# Patient Record
Sex: Female | Born: 1935 | Race: Black or African American | Hispanic: No | Smoking: Never smoker
Health system: Southern US, Community
[De-identification: ages and names within clinical notes are randomized; demographics above are authoritative.]

## PROBLEM LIST (undated history)

## (undated) DIAGNOSIS — I10 Essential (primary) hypertension: Secondary | ICD-10-CM

## (undated) DIAGNOSIS — E119 Type 2 diabetes mellitus without complications: Secondary | ICD-10-CM

## (undated) DIAGNOSIS — N2889 Other specified disorders of kidney and ureter: Secondary | ICD-10-CM

## (undated) DIAGNOSIS — N181 Chronic kidney disease, stage 1: Secondary | ICD-10-CM

---

## 2004-06-07 ENCOUNTER — Emergency Department (HOSPITAL_COMMUNITY): Admission: EM | Admit: 2004-06-07 | Discharge: 2004-06-08 | Payer: Self-pay | Admitting: Emergency Medicine

## 2006-01-02 ENCOUNTER — Other Ambulatory Visit: Admission: RE | Admit: 2006-01-02 | Discharge: 2006-01-02 | Payer: Self-pay | Admitting: Family Medicine

## 2006-06-22 ENCOUNTER — Ambulatory Visit (HOSPITAL_COMMUNITY): Admission: RE | Admit: 2006-06-22 | Discharge: 2006-06-22 | Payer: Self-pay | Admitting: Gastroenterology

## 2007-01-27 ENCOUNTER — Emergency Department (HOSPITAL_COMMUNITY): Admission: EM | Admit: 2007-01-27 | Discharge: 2007-01-27 | Payer: Self-pay | Admitting: Emergency Medicine

## 2007-02-25 ENCOUNTER — Ambulatory Visit (HOSPITAL_COMMUNITY): Admission: RE | Admit: 2007-02-25 | Discharge: 2007-02-26 | Payer: Self-pay | Admitting: Neurological Surgery

## 2007-06-28 ENCOUNTER — Emergency Department (HOSPITAL_COMMUNITY): Admission: EM | Admit: 2007-06-28 | Discharge: 2007-06-29 | Payer: Self-pay | Admitting: Emergency Medicine

## 2007-07-03 ENCOUNTER — Emergency Department (HOSPITAL_COMMUNITY): Admission: EM | Admit: 2007-07-03 | Discharge: 2007-07-03 | Payer: Self-pay | Admitting: Emergency Medicine

## 2007-08-29 ENCOUNTER — Inpatient Hospital Stay (HOSPITAL_COMMUNITY): Admission: EM | Admit: 2007-08-29 | Discharge: 2007-09-03 | Payer: Self-pay | Admitting: Emergency Medicine

## 2007-08-30 ENCOUNTER — Encounter (INDEPENDENT_AMBULATORY_CARE_PROVIDER_SITE_OTHER): Payer: Self-pay | Admitting: Internal Medicine

## 2009-12-10 IMAGING — CR DG HIP COMPLETE 2+V*R*
3 series · 7 of 7 positions shown · non-contrast
Comparison: NONE

CLINICAL DATA: Right leg pain. 

RIGHT HIP

[Series 1: view not recorded · 0.17mm/px · 2 of 2 slices shown (1 of 3)]
[im 1/2]
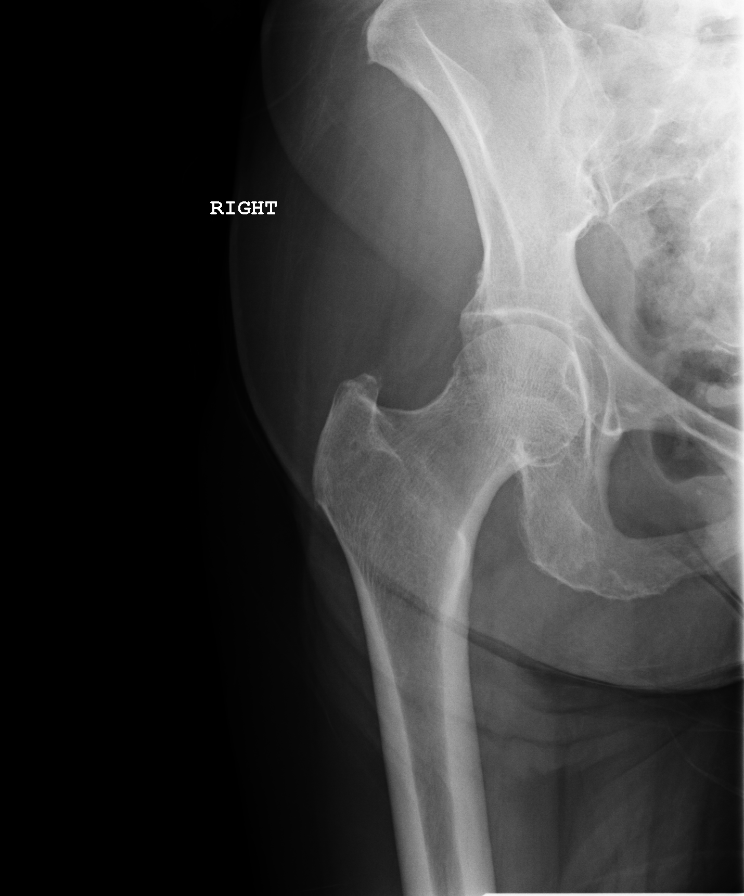
[im 2/2]
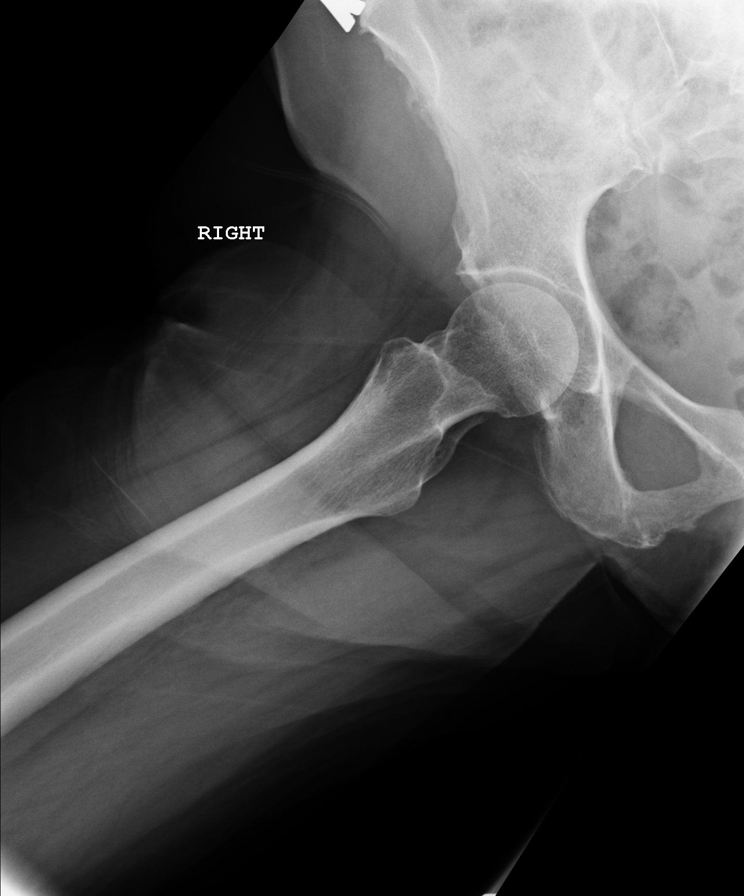

[Series 2: view not recorded · 0.17mm/px · 4 of 4 slices shown (2 of 3)]
[im 1/4]
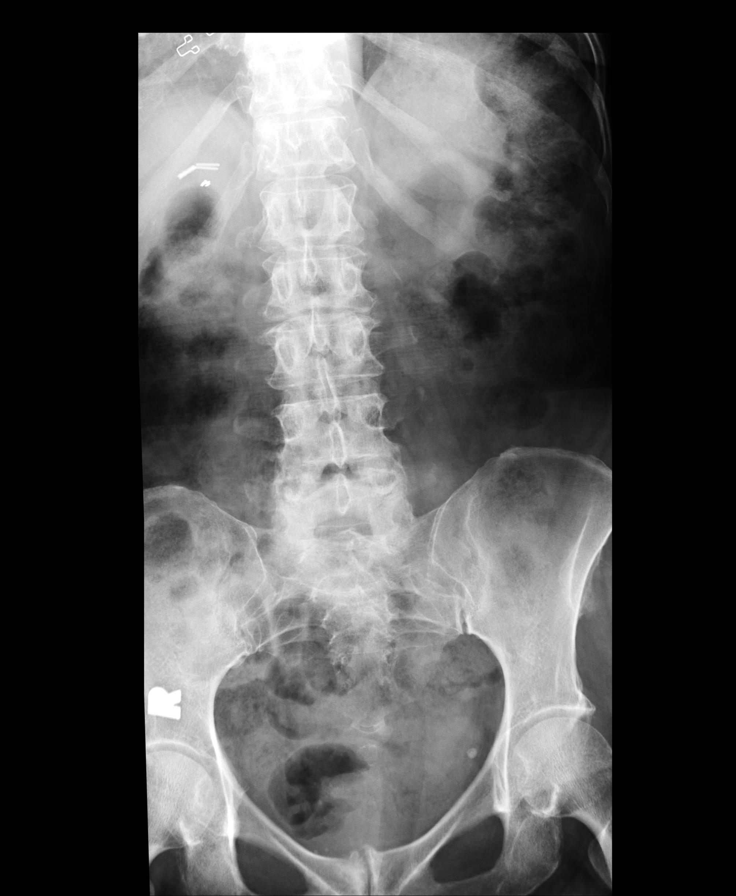
[im 2/4]
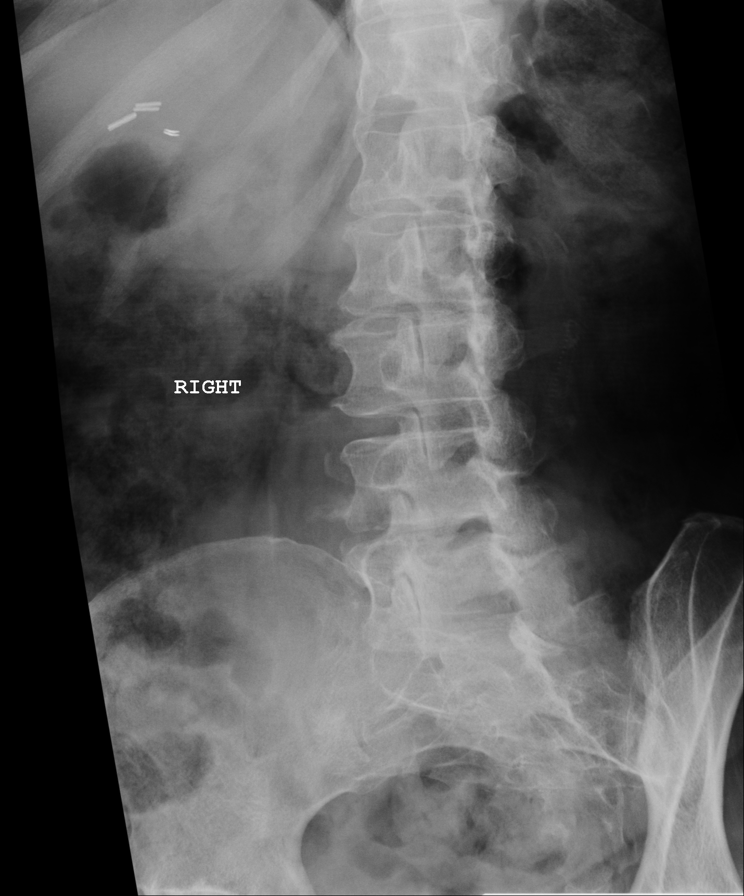
[im 3/4]
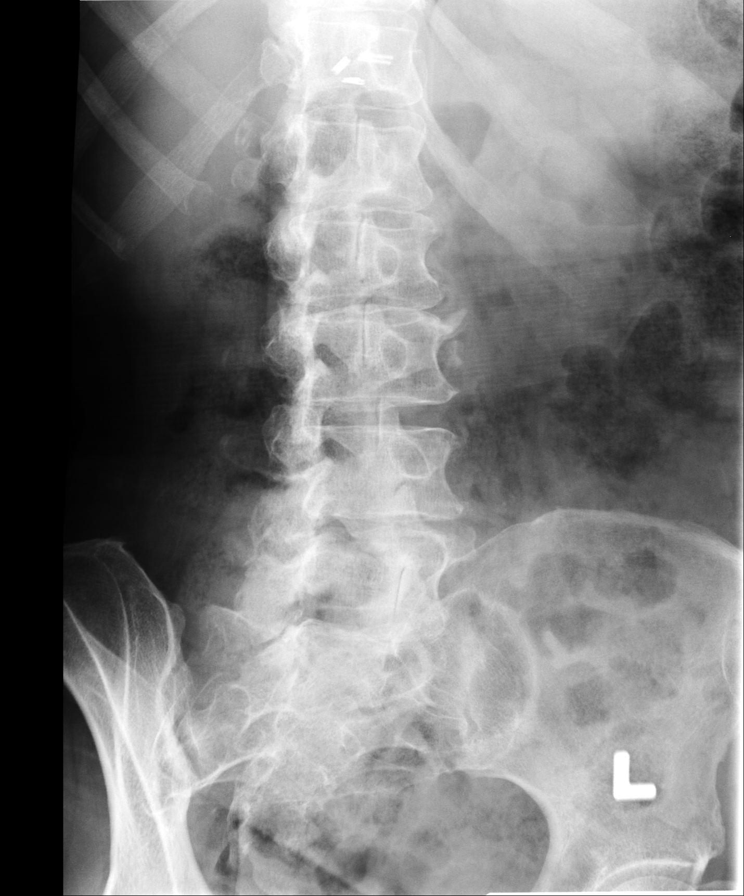
[im 4/4]
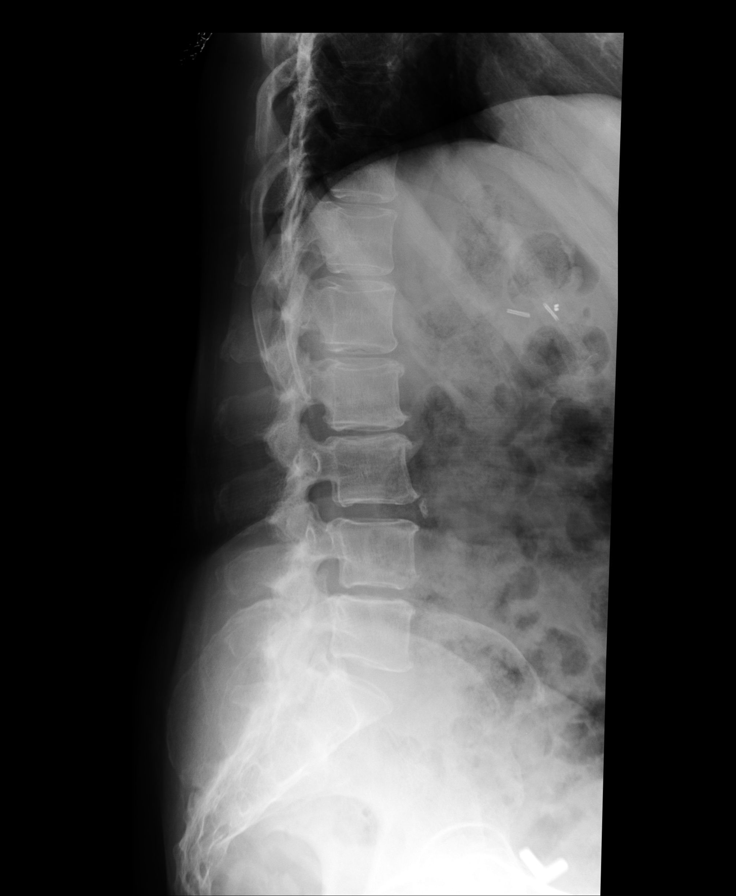

[view not recorded (3 of 3)]
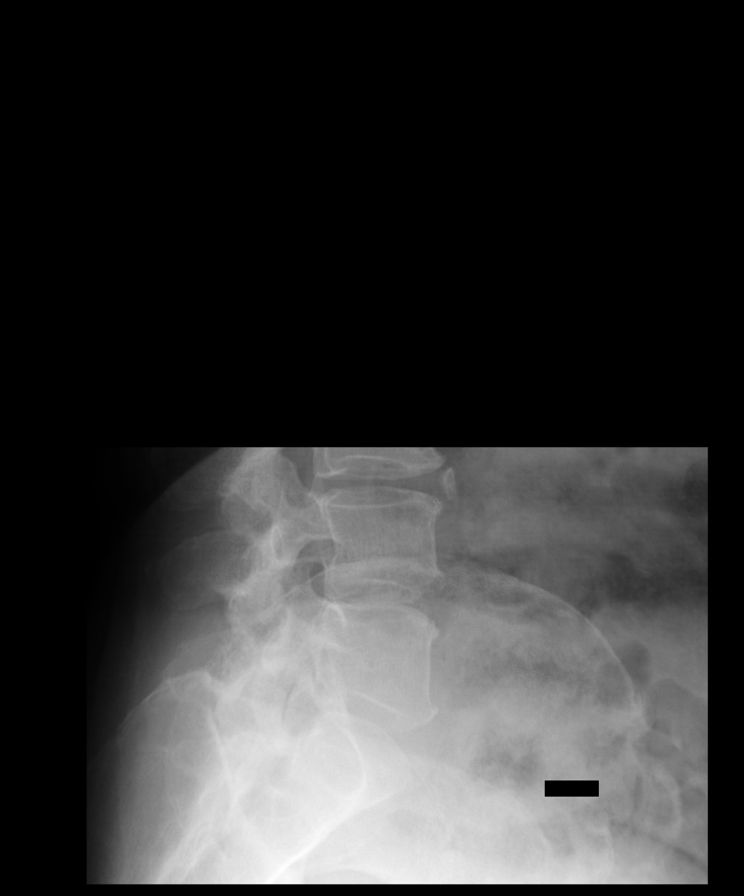

[7 of 7 positions shown; findings below may reference images not displayed]

FINDINGS: Views of the right hip demonstrate no evidence of 
fracture, dislocation, soft tissue abnormality or changes 
suggesting erosive or degenerative arthritis. 

electronically reviewed on 01/15/2007 Dict Date: 01/14/2007  Tran 
Date:  01/15/2007 DAS  [REDACTED]

## 2010-05-21 NOTE — Op Note (Signed)
NAME:  Adrienne Gonzalez, Adrienne Gonzalez NO.:  [id number]   MEDICAL RECORD NO.:  [id number]          PATIENT TYPE:  INP   LOCATION:  3028                         FACILITY:  [facility]   PHYSICIAN:  Tia Alert, MD     DATE OF BIRTH:  [birth year]   DATE OF PROCEDURE:  02/25/2007  DATE OF DISCHARGE:                               OPERATIVE REPORT   PREOPERATIVE DIAGNOSIS:  Lumbar spinal stenosis, L4-5, with right leg  pain.   POSTOPERATIVE DIAGNOSIS:  Lumbar spinal stenosis, L4-5, with right leg  pain.   PROCEDURE:  Decompressive lumbar hemilaminectomy, medial facetectomy and  foraminotomy L4-5 on the right for decompression of the right L5 nerve  root and lateral recess.   SURGEON:  Marikay Alar, M.D.   ASSISTANT:  Dr. Aliene Beams.   ANESTHESIA:  General tracheal.   COMPLICATIONS:  None apparent.   INDICATIONS FOR PROCEDURE:  Ms. Proano is a 77 year old female who is  referred with severe right leg pain.  She had MRI which showed severe  spinal stenosis at L4-5 with severe degenerative disease.  I recommended  a lumbar hemilaminectomy, medial facetectomy, with foraminotomy at L4-5  on the right side to decompress the right L5 nerve root.  She understood  the risks, benefits, and expected outcome and wished to proceed.   DESCRIPTION OF PROCEDURE:  The patient was taken to operating room and  after induction of adequate generalized endotracheal anesthesia, she was  rolled into the prone position on the Wilson frame.  All pressure points  were padded.  Her lumbar region was prepped with DuraPrep and draped in  usual sterile fashion.  5 mL local anesthesia was injected and a small  dorsal midline incision was made and carried down to the lumbosacral  fascia.  The fascia was opened.  The paraspinous musculature was taken  down in subperiosteal fashion to expose L4-5 on the right.  Intraoperative x-ray confirmed my level and then I used the high-speed  drill and the Kerrison punches  to perform a hemilaminectomy, medial  facetectomy and foraminotomy at L4-5 on the right side.  The underlying  yellow ligament was very thickened.  It was opened and removed in a  piecemeal fashion to identify the underlying dura and L5 nerve root.  I  dissected out to the medial pedicle wall and decompressed the lateral  recess by undercutting the facet.  I then palpated with a coronary  dilator into the foramen along the nerve root into the midline to assure  adequate decompression.  I felt like I had a good decompression of the  L5 nerve root.  I irrigated with saline solution containing bacitracin,  dried all bleeding points and then lined the dura with Gelfoam and  closed the fascia with 0-0 Vicryl, closed the subcutaneous and  subcuticular tissue with 2-0 and 3-0 Vicryl and closed the skin  with Benzoin and Steri-Strips.  The drapes removed.  Sterile dressing  was applied.  The patient was awakened from general anesthesia and  transferred to recovery room in stable condition.  At the end of  procedure all sponge, needle and sponge counts were correct.      Tia Alert, MD  Electronically Signed     DSJ/MEDQ  D:  02/25/2007  T:  02/26/2007  Job:  [id number]

## 2010-05-21 NOTE — Discharge Summary (Signed)
Adrienne Gonzalez, GAERTNER NO.:  [id number]   MEDICAL RECORD NO.:  [id number]          PATIENT TYPE:  INP   LOCATION:  1433                         FACILITY:  [facility]   PHYSICIAN:  Hollice Espy, M.D.DATE OF BIRTH:  [birth year]   DATE OF ADMISSION:  08/29/2007  DATE OF DISCHARGE:                               DISCHARGE SUMMARY   ANTICIPATED DATE OF DISCHARGE:  September 03, 2007.   PCP:  Dr. Laurann Montana.   GASTROENTEROLOGIST:  Dr. Randa Evens.   DISCHARGE DIAGNOSES:  1. Protein calorie malnutrition.  2. Vitamin deficiency including hypokalemia.  3. Hypocalcemia secondary vitamin deficiency.  4. Acute renal failure secondary to diarrhea now back to baseline.  5. Chronic renal insufficiency with baseline creatinine of 1.9.  6. Ileus now resolved.  7. Iron-deficiency anemia, status post transfusion.   HOSPITAL COURSE:  The patient is an 77 year old African American female  with past medical history of hypertension who, for the last several  months, had been having problems with chronic diarrhea.  She was  evaluated by gastroenterology and her PCP and eventually her diarrhea  had subsided about 10 days to 2 weeks before coming to the hospital.  However, since her diarrhea had resolved, the patient's daughter had  noted the patient had been eating less and less a got to the point where  she was eating so little, that the daughter brought the patient when the  patient had suffered a fall off the bed.  After initial evaluation the  patient was admitted for failure to thrive with dehydration on admission  the patient was found to have a creatinine of 3.57 with her baseline,  reportedly being around 1.5.  Rest of her labs were unremarkable except  some mildly elevated transaminases.  The patient was brought into the  hospital.  Regards to her nutrition status, initially nutrition consult  was put in.  The patient had attempts to eat.  She was found to have  severe vitamin  deficiencies including a low potassium as low as 1.7,  calcium 4.8 and attempts for the patient to eat led to nausea and  vomiting.  That the patient had abdominal x-ray which showed a KUB  showing an ileus and significant constipation.  The patient was given  medication for this.  She moved her bowels and by followup film done on  August 26 showed resolution of ileus and resolution of constipation.  In  the meantime, the patient was aggressively hydrated and by August 27,  her BUN had come down to 10, her creatinine was down to 1.9, much closer  to her baseline.  Over time, nutrition had been following her.  The  patient was given additional supplementation to replace her hypokalemia  and hypocalcemia.  The plan will be for her to go home on multivitamin  and Os-Cal 500 b.i.d.  Nutrition met with the patient and recommended by  time of discharge the patient go home on a carb-modified diet with  Ensure with fiber t.i.d. with meals.  Nutrition services will continue  to follow her via home health.  The patient was evaluated by PT/OT here  in the hospital who recommend that she go home with skilled nurse as  well as PT/OT and a home health aid, as well as equipment included  hospital bed, three-in-one commode and a rolling walker.  These things  are being set up through [facility].  By August 27 the patient  was starting to feel improved.  Her renal function is much closer to her  baseline.  She will continue to receive IV fluids until August 28, date  of discharge.  Her overall disposition from initial presentation is  improved.  Her activity will be as per home health PT/OT.  Discharge  diet is as above.  She will follow up with PCP, Dr. Cliffton Asters, in 1 week and  Dr. Randa Evens, her GI doctor in 2 weeks.   In addition, her discharge medications are as follows.  New medications:  1. Multivitamin p.o. daily.  2. Os-Cal 500 mg p.o. b.i.d.   She will continue previous medications:  1.  Aspirin 81 mg p.o. daily.  2. Paroxetine 10 p.o. daily.  3. Benicar 40/25 one-half tablet p.o. daily.  4. Cosopt eyedrops both eyes daily.  5. Travatan eye drops both eyes daily.  6. Elavil 10 mg p.o. nightly.      Hollice Espy, M.D.  Electronically Signed     SKK/MEDQ  D:  09/02/2007  T:  09/02/2007  Job:  [id number]

## 2010-05-21 NOTE — H&P (Signed)
NAME:  Adrienne Gonzalez, Adrienne Gonzalez NO.:  [id number]   MEDICAL RECORD NO.:  [id number]          PATIENT TYPE:  EMS   LOCATION:  ED                           [facility]AC[facility]   PHYSICIAN:  Michiel Cowboy, MDDATE OF BIRTH:  20-May-1925   DATE OF ADMISSION:  08/29/2007  DATE OF DISCHARGE:                              HISTORY & PHYSICAL   PRIMARY CARE Rahmon Heigl:  Dr. Cliffton Asters   GASTROENTEROLOGIST:  Dr. Randa Evens.   HISTORY OF PRESENT ILLNESS:  The patient is an 77 year old female with  history of chronic nausea and vomiting with decreased p.o. intake for  which she has been having a full workup done by Dr. Randa Evens.  So far,  per family, workup was unremarkable.  The patient also has history of  diabetes and hypertension.  She comes in today brought in by her family  because she noted to have suffered a fall off the bed earlier this  night.  Also, per family, she has not been eating well for some time  now.  Per family for past couple of days, they could only get a couple  ounces of food and drink in her.  She is basically either refusing the  food or waiting to eat.  The patient is constantly saying to the family  she needs to go to the bathroom, but when goes to the bathroom, does not  produce anything.  Family has not seen any nausea or vomiting.  Patient  is not providing her own history, but does answer yes and no to the  questions.  The patient states she has not had any chest pain or  shortness of breath.  Not noticed any fevers.  Also, per family, the  patient has been walking strangely dragging her left or thinking that it  has probably been going on for about a week or so.  Also, had been  unsteady on her feet, possibly for the past couple weeks.  Otherwise,  also noted some change in her weight.  She seemed to be more skinny than  before.  No urinary complaints.  No skin changes.  The patient was  reportedly sleeping, and then was found next to bed after a big thump.  Unclear if she was trying to get up from bed by herself to use the  restroom and fell versus she rolled out of bed and fell.  She did not  lose consciousness but did hit her head.  CT scan of the head in the  emergency department was unremarkable.  CT scan of the neck showed old  osteoarthritis but no new fractures.  [facility] hospitalists called to admit  the patient. Of note, the patient's family has been stating that her  sugars have been kind of running low in the 70s to 90s.  That is the  reason why they discontinued her glimepiride.   REVIEW OF SYSTEMS:  As per HPI, otherwise unremarkable.  All 12 systems  reviewed.   PAST MEDICAL HISTORY:  1. Recurrent nausea/vomiting.  2. Failure to thrive.  3. History of hypertension.  4. Diabetes.  ALLERGIES:  The patient is allergic to morphine, codeine and Vicodin.   SOCIAL HISTORY:  The patient does not smoke or drink or use drugs.  Lives at home with her family.   FAMILY HISTORY:  Noncontributory.   MEDICATIONS:  1. Aspirin 81 mg today.  2. Paroxetine 10 mg p.o. daily.  3. Benicar 40/25 once a day.  The patient been taking half a tablet.  4. Cosopt eye drops one drop to both eyes.  5. Travatan one drop q.h.s. to left eye.  6. Amitriptyline 10 mg p.o. bedtime.  7. The patient stopped taking her dicyclomine and      diphenoxylate/atropine and glimepiride.   PHYSICAL EXAMINATION:  VITALS:  Temperature 97.3, blood pressure 106/68,  pulse 67, respirations 27, 100% on room air.  GENERAL:  The patient appears in no acute distress, lying down on the  stretcher.  Thin female, head is small, swelling on the forehead.  Otherwise, unremarkable.  Dry mucous membranes.  Decreased skin turgor.  LUNGS:  Occasional crackles at the bases bilaterally, worse on the left.  ABDOMEN:  Soft, nontender, nondistended.  HEART:  Regular rate and rhythm.  No murmurs, rubs or gallops.  LOWER EXTREMITIES: Without edema.  NEUROLOGIC:  There is 5/5 strength in  upper extremities with possible  left leg weakness.  The patient unable to lift it off the bed.  Also,  possible foot drop, the patient is not well cooperative with exam.  Does  not follow directions.  Cranial nerves II-XII intact.   LABORATORY DATA:  White blood cell count 5.9, hemoglobin 8.9, platelets  227.  Sodium 142, potassium 2.5, creatinine 3.57, baseline 1.2.  Total  bilirubin 1.1, calcium 4.1, alk phos 89, AST 106, ALT 52, alkaline 1.8.   RADIOLOGY:  CT scan of the head showing no acute abnormalities.  CT scan  of the neck showed no fracture, but history of osteoarthritis.  EKG is  very poor quality, but showing sinus rhythm.  Will repeat.   ASSESSMENT/PLAN:  This is an 77 year old female with a history of  repeated episodes of dehydration secondary to poor nutrition on  Phenergan and dehydration.  1. Failure to thrive with dehydration.  Will obtain nutrition consult,      give IV fluids.  Again, the patient had not been eating well.  Will      give thiamine.  If nauseous, will treat with Zofran.  If nausea      persists, would consider official GI consult.  2. Acute renal failure, likely secondary to dehydration, but will      obtain a urine sodium and urine creatinine, renal ultrasound.  3. Elevated LFTs.  Will obtain right upper quadrant ultrasound to      evaluate if this has anything to do with her nausea potentially.  4. Hypokalemia.  Replace, watch on telemetry.  5. Hypocalcemia.  Replace.  6. Constant urge to go to the restroom to defecate.  Will obtain KUB      to evaluate if there is any obstipation.  Per exam, not likely.  7. Hypoalbuminemia.  Likely nutritional, will obtain nutrition      consult.  8. Anemia.  We will do workup with hemoccult stool and anemia panel.  9. Prophylaxis.  Protonix/SCDs until figure out what the cause of      anemia is.  10.Code status is full code.      Michiel Cowboy, MD  Electronically Signed     AVD/MEDQ  D:  08/29/2007  T:  08/29/2007  Job:  [id number]   cc:   Stacie Acres. Cliffton Asters, M.D.  Fax: [phone]   Llana Aliment. Malon Kindle., M.D.  Fax: [phone]

## 2010-09-27 LAB — URINALYSIS, ROUTINE W REFLEX MICROSCOPIC
Bilirubin Urine: NEGATIVE
Glucose, UA: 100 — AB
Hgb urine dipstick: NEGATIVE
Ketones, ur: 15 — AB
Nitrite: NEGATIVE
Protein, ur: NEGATIVE
Specific Gravity, Urine: 1.011
Urobilinogen, UA: 1
pH: 7.5

## 2010-09-27 LAB — CBC
HCT: 31.5 — ABNORMAL LOW
HCT: 32.4 — ABNORMAL LOW
Hemoglobin: 10.4 — ABNORMAL LOW
MCHC: 33
MCV: 96.2
Platelets: 247
Platelets: 263
RBC: 3.27 — ABNORMAL LOW
RDW: 14.3
WBC: 11 — ABNORMAL HIGH
WBC: 4.8

## 2010-09-27 LAB — BASIC METABOLIC PANEL
BUN: 24 — ABNORMAL HIGH
GFR calc non Af Amer: 40 — ABNORMAL LOW
Potassium: 4.7

## 2010-09-27 LAB — COMPREHENSIVE METABOLIC PANEL WITH GFR
ALT: 30
Albumin: 3.5
Alkaline Phosphatase: 82
Calcium: 8.6
GFR calc Af Amer: 50 — ABNORMAL LOW
Potassium: 4.1
Sodium: 132 — ABNORMAL LOW
Total Protein: 6.9

## 2010-09-27 LAB — DIFFERENTIAL
Basophils Absolute: 0
Basophils Relative: 0
Eosinophils Absolute: 0
Eosinophils Relative: 0
Eosinophils Relative: 1
Lymphocytes Relative: 5 — ABNORMAL LOW
Lymphocytes Relative: 29
Lymphs Abs: 0.5 — ABNORMAL LOW
Lymphs Abs: 1.4
Monocytes Absolute: 0.2
Monocytes Relative: 2 — ABNORMAL LOW
Neutro Abs: 10.3 — ABNORMAL HIGH
Neutrophils Relative %: 94 — ABNORMAL HIGH
Neutrophils Relative %: 63

## 2010-09-27 LAB — APTT: aPTT: 30

## 2010-09-27 LAB — PROTIME-INR
INR: 0.9
Prothrombin Time: 12.8

## 2010-09-27 LAB — COMPREHENSIVE METABOLIC PANEL
AST: 26
BUN: 20
CO2: 26
Chloride: 97
Creatinine, Ser: 1.25 — ABNORMAL HIGH
GFR calc non Af Amer: 41 — ABNORMAL LOW
Glucose, Bld: 195 — ABNORMAL HIGH
Total Bilirubin: 1.2

## 2010-09-27 LAB — LIPASE, BLOOD: Lipase: 11

## 2010-10-03 LAB — POCT I-STAT, CHEM 8
Chloride: 110
Creatinine, Ser: 1.7 — ABNORMAL HIGH
Glucose, Bld: 84
HCT: 36
Potassium: 3 — ABNORMAL LOW

## 2010-10-03 LAB — URINE MICROSCOPIC-ADD ON

## 2010-10-03 LAB — CBC
RBC: 3.84 — ABNORMAL LOW
WBC: 4

## 2010-10-03 LAB — URINALYSIS, ROUTINE W REFLEX MICROSCOPIC
Bilirubin Urine: NEGATIVE
Hgb urine dipstick: NEGATIVE
Nitrite: NEGATIVE
Specific Gravity, Urine: 1.019
pH: 5.5

## 2010-10-03 LAB — DIFFERENTIAL
Lymphocytes Relative: 30
Lymphs Abs: 1.2
Monocytes Relative: 18 — ABNORMAL HIGH
Neutro Abs: 2
Neutrophils Relative %: 51

## 2010-10-03 LAB — URINE CULTURE

## 2011-05-23 ENCOUNTER — Other Ambulatory Visit: Payer: Self-pay | Admitting: Nephrology

## 2011-06-10 ENCOUNTER — Ambulatory Visit
Admission: RE | Admit: 2011-06-10 | Discharge: 2011-06-10 | Disposition: A | Discharge status: Home / Self Care | Payer: Medicare Other | Source: Ambulatory Visit | Attending: Nephrology | Admitting: Nephrology

## 2017-07-01 ENCOUNTER — Ambulatory Visit: Payer: Medicare Other | Attending: Ophthalmology | Admitting: Occupational Therapy

## 2017-07-01 ENCOUNTER — Encounter: Payer: Self-pay | Admitting: Occupational Therapy

## 2017-07-01 DIAGNOSIS — R41842 Visuospatial deficit: Secondary | ICD-10-CM | POA: Diagnosis present

## 2017-07-01 DIAGNOSIS — H53413 Scotoma involving central area, bilateral: Secondary | ICD-10-CM | POA: Diagnosis present

## 2017-07-01 NOTE — Therapy (Signed)
[facility] [address] [place], [place], [address] Phone: [phone]   Fax:  [phone]  Occupational Therapy Evaluation  Patient Details  Name: Adrienne Gonzalez MRN: [id number] Date of Birth: 1925/01/20 Referring Provider: Dr. Chalmers Guestoy Whitaker   Encounter Date: 07/01/2017  OT End of Session - 07/01/17 1228    Visit Number  1    Number of Visits  1    Date for OT Re-Evaluation  -- n/a    Authorization Type  [employer] M[employer]are    OT Start Time  1025    OT Stop Time  1143    OT Time Calculation (min)  78 min    Activity Tolerance  Patient tolerated treatment well    Behavior During Therapy  [facility] for tasks assessed/performed       History reviewed. No pertinent past medical history.  History reviewed. No pertinent surgical history.  There were no vitals filed for this visit.  Subjective Assessment - 07/01/17 1023    Subjective   Pt with glaucoma, diabetic retinopathy, and macular pucker     Patient Stated Goals  see better     Currently in Pain?  Yes    Pain Score  2     Pain Location  Toe (Comment which one)    Pain Orientation  Left    Pain Descriptors / Indicators  Aching    Pain Type  Acute pain    Pain Frequency  Intermittent    Aggravating Factors   pedicure    Pain Relieving Factors  unknown        [facility] OT Assessment - 07/01/17 1030      Assessment   Medical Diagnosis  glaucoma, macular pucker, diabetic retinopathy    Referring Provider  Dr. Chalmers Guestoy Whitaker    Onset Date/Surgical Date  05/21/17      Precautions   Precautions  Fall    Precaution Comments  -- low vision      Restrictions   Weight Bearing Restrictions  No      Balance Screen   Has the patient fallen in the past 6 months  No    Has the patient had a decrease in activity level because of a fear of falling?   No    Is the patient reluctant to leave their home because of a fear of falling?   No      Home  Environment   Family/patient  expects to be discharged to:  Private residence    Living Arrangements  Children    Type of Home  House    Home Access  Stairs    Home Layout  Multi-level    Bathroom Shower/Tub  Tub/Shower unit has grab bar    Lives With  Daughter      Prior Function   Level of Independence  Independent with basic ADLs;Independent with household mobility with device;Independent with homemaking with ambulation    Vocation  Retired    Leisure  rides exercise bike       ADL   Eating/Feeding  Needs assist with cutting food min v.c seeing her food     Grooming  Modified independent    Upper Body Bathing  Modified independent has seat    Lower Body Bathing  Modified independent    Upper Body Dressing  Needs assist for fasteners    Lower Body Dressing  Needs assist for fasteners    Toilet Transfer  Independent      IADL  Shopping  Needs to be accompanied on any shopping trip    Light Housekeeping  Performs light daily tasks such as dishwashing, bed making    Meal Prep  Needs to have meals prepared and served    Medication Management  Takes responsibility if medication is prepared in advance in seperate dosage      Written Expression   Dominant Hand  Right      Vision - History   Baseline Vision  Wears glasses all the time    Visual History  Glaucoma diabetic retinopathy, macular pucker      Vision Assessment   Vision Assessment  Vision tested    Visual Acuity  Per MD/OD report    Per MD/OD Report  OD 20/70-2, OS CF '3    Reading Acuity  (1.0) - 20/200   Patient has diffculty with activities due to visual impairment  Writing checks;Reading bills      Cognition   Overall Cognitive Status  Within Functional Limits for tasks assessed    Mini Mental State Exam   26/30                      OT Education - 07/01/17 1237    Education Details  Pt/ dtr were educated regarding use of 4x handheld magnifier,  pt is able to spot read. Pt was shown line and signature guides. Pt was educated  in use of a handheld video magnifier,pt returned demonstration of use with v.c.and  pt's dtr is able to cue her for use. Pt was educated regarding importance of good lighting, use of contast and  ways to maximize use of remaining vision. Pt's dtr was provided with info for purchase of AE.    Person(s) Educated  Patient;Child(ren)    Methods  Explanation;Demonstration;Verbal cues;Handout    Comprehension  Verbalized understanding;Returned demonstration;Verbal cues required          OT Long Term Goals - 07/01/17 1233      OT LONG TERM GOAL #1   Title  not applicable, 1x eval            Plan - 07/01/17 1229    Clinical Impression Statement  Pt is a pleasant 84 y.o female with diagnosis of glaucoma, macular pucker, and diabetic retinopathy who presents with visual deficits which impede perfromance of ADLS/IADLS. Pt can benefit from skilled occupational therapy to maximize safety and independence through education regarding AE, and adapted strategies for visual deficits.     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 She reports difficulty seeing and reading.    Occupational performance deficits (Please refer to evaluation for details):  ADL's;IADL's;Social Participation    Rehab Potential  Good    Current Impairments/barriers affecting progress:  severity of impairments    OT Frequency  One time visit    OT Duration  12 weeks    OT Treatment/Interventions  Self-care/ADL training;Patient/family education;DME and/or AE instruction;Visual/perceptual remediation/compensation    Plan  Pt was seen for a 1x visit for evaluation and treatment on the same day. No goals were set as education was completed on day of eval. Pt/ daughter verbalized understanding of all education.    Clinical Decision Making  Limited treatment options, no task modification necessary    Consulted and Agree with Plan of Care   Patient;Family member/caregiver    Family Member Consulted  daughter       Patient  will benefit from skilled therapeutic intervention in order to improve the following deficits and impairments:  Impaired vision/preception, Decreased safety awareness, Decreased knowledge of precautions  Visit Diagnosis: Scotoma involving central area, bilateral    Problem List There are no active problems to display for this patient.   RINE,KATHRYN 07/01/2017, 12:42 PM Keene Breath, OTR/L [phone] Phone: [phone] 12:44 PM 07/01/17 [facility] [address] [place], [place], [address] Phone: [phone]   Fax:  [phone]  Name: Adrienne Gonzalez MRN: [id number] Date of Birth: [birth year]

## 2018-01-07 ENCOUNTER — Encounter (HOSPITAL_COMMUNITY): Payer: Self-pay | Admitting: Emergency Medicine

## 2018-01-07 ENCOUNTER — Observation Stay (HOSPITAL_COMMUNITY)
Admission: EM | Admit: 2018-01-07 | Discharge: 2018-01-11 | Disposition: A | Discharge status: Home / Self Care | Payer: Medicare Other | Attending: Internal Medicine | Admitting: Internal Medicine

## 2018-01-07 ENCOUNTER — Other Ambulatory Visit: Payer: Self-pay

## 2018-01-07 DIAGNOSIS — Z7982 Long term (current) use of aspirin: Secondary | ICD-10-CM | POA: Diagnosis not present

## 2018-01-07 DIAGNOSIS — N181 Chronic kidney disease, stage 1: Secondary | ICD-10-CM | POA: Diagnosis present

## 2018-01-07 DIAGNOSIS — E872 Acidosis, unspecified: Secondary | ICD-10-CM | POA: Diagnosis present

## 2018-01-07 DIAGNOSIS — K5790 Diverticulosis of intestine, part unspecified, without perforation or abscess without bleeding: Secondary | ICD-10-CM | POA: Insufficient documentation

## 2018-01-07 DIAGNOSIS — R197 Diarrhea, unspecified: Secondary | ICD-10-CM | POA: Insufficient documentation

## 2018-01-07 DIAGNOSIS — I129 Hypertensive chronic kidney disease with stage 1 through stage 4 chronic kidney disease, or unspecified chronic kidney disease: Secondary | ICD-10-CM | POA: Diagnosis not present

## 2018-01-07 DIAGNOSIS — Z79899 Other long term (current) drug therapy: Secondary | ICD-10-CM | POA: Insufficient documentation

## 2018-01-07 DIAGNOSIS — E860 Dehydration: Secondary | ICD-10-CM | POA: Diagnosis present

## 2018-01-07 DIAGNOSIS — E11649 Type 2 diabetes mellitus with hypoglycemia without coma: Principal | ICD-10-CM | POA: Insufficient documentation

## 2018-01-07 DIAGNOSIS — N179 Acute kidney failure, unspecified: Secondary | ICD-10-CM | POA: Insufficient documentation

## 2018-01-07 DIAGNOSIS — E162 Hypoglycemia, unspecified: Secondary | ICD-10-CM | POA: Diagnosis present

## 2018-01-07 DIAGNOSIS — E119 Type 2 diabetes mellitus without complications: Secondary | ICD-10-CM

## 2018-01-07 DIAGNOSIS — N182 Chronic kidney disease, stage 2 (mild): Secondary | ICD-10-CM | POA: Insufficient documentation

## 2018-01-07 DIAGNOSIS — I10 Essential (primary) hypertension: Secondary | ICD-10-CM | POA: Diagnosis present

## 2018-01-07 DIAGNOSIS — E1122 Type 2 diabetes mellitus with diabetic chronic kidney disease: Secondary | ICD-10-CM | POA: Diagnosis not present

## 2018-01-07 HISTORY — DX: Other specified disorders of kidney and ureter: N28.89

## 2018-01-07 HISTORY — DX: Type 2 diabetes mellitus without complications: E11.9

## 2018-01-07 HISTORY — DX: Chronic kidney disease, stage 1: N18.1

## 2018-01-07 HISTORY — DX: Essential (primary) hypertension: I10

## 2018-01-07 LAB — CBC
HEMATOCRIT: 37 % (ref 36.0–46.0)
Hemoglobin: 11.6 g/dL — ABNORMAL LOW (ref 12.0–15.0)
MCH: 29.2 pg (ref 26.0–34.0)
MCHC: 31.4 g/dL (ref 30.0–36.0)
MCV: 93.2 fL (ref 80.0–100.0)
PLATELETS: 241 10*3/uL (ref 150–400)
RBC: 3.97 MIL/uL (ref 3.87–5.11)
RDW: 13.7 % (ref 11.5–15.5)
WBC: 6.4 10*3/uL (ref 4.0–10.5)
nRBC: 0 % (ref 0.0–0.2)

## 2018-01-07 LAB — COMPREHENSIVE METABOLIC PANEL
ALK PHOS: 37 U/L — AB (ref 38–126)
ALT: 38 U/L (ref 0–44)
AST: 29 U/L (ref 15–41)
Albumin: 3.3 g/dL — ABNORMAL LOW (ref 3.5–5.0)
Anion gap: 12 (ref 5–15)
BUN: 23 mg/dL (ref 8–23)
CALCIUM: 8.5 mg/dL — AB (ref 8.9–10.3)
CHLORIDE: 110 mmol/L (ref 98–111)
CO2: 14 mmol/L — AB (ref 22–32)
CREATININE: 1.65 mg/dL — AB (ref 0.44–1.00)
GFR calc Af Amer: 31 mL/min — ABNORMAL LOW (ref >60)
GFR calc non Af Amer: 27 mL/min — ABNORMAL LOW (ref >60)
GLUCOSE: 57 mg/dL — AB (ref 70–99)
Potassium: 4.1 mmol/L (ref 3.5–5.1)
SODIUM: 136 mmol/L (ref 135–145)
Total Bilirubin: 0.6 mg/dL (ref 0.3–1.2)
Total Protein: 6 g/dL — ABNORMAL LOW (ref 6.5–8.1)

## 2018-01-07 LAB — URINALYSIS, ROUTINE W REFLEX MICROSCOPIC
BILIRUBIN URINE: NEGATIVE
GLUCOSE, UA: NEGATIVE mg/dL
HGB URINE DIPSTICK: NEGATIVE
KETONES UR: 5 mg/dL — AB
LEUKOCYTES UA: NEGATIVE
Nitrite: NEGATIVE
PH: 5 (ref 5.0–8.0)
Protein, ur: NEGATIVE mg/dL
Specific Gravity, Urine: 1.017 (ref 1.005–1.030)

## 2018-01-07 LAB — LIPASE, BLOOD: LIPASE: 12 U/L (ref 11–51)

## 2018-01-07 NOTE — ED Triage Notes (Signed)
Onset 1 week ago developed nausea, emesis, and diarrhea. Seen at an walk in clinic and stated patient stomach virus. Seen primary doctor today sent patient to the ED for evaluation. The office unable to obtain blood work and had multiple episodes of diarrhea  and no vomiting.

## 2018-01-08 ENCOUNTER — Observation Stay (HOSPITAL_COMMUNITY): Payer: Medicare Other

## 2018-01-08 ENCOUNTER — Other Ambulatory Visit: Payer: Self-pay

## 2018-01-08 ENCOUNTER — Encounter (HOSPITAL_COMMUNITY): Payer: Self-pay | Admitting: Family Medicine

## 2018-01-08 DIAGNOSIS — N179 Acute kidney failure, unspecified: Secondary | ICD-10-CM | POA: Diagnosis present

## 2018-01-08 DIAGNOSIS — E119 Type 2 diabetes mellitus without complications: Secondary | ICD-10-CM

## 2018-01-08 DIAGNOSIS — E162 Hypoglycemia, unspecified: Secondary | ICD-10-CM | POA: Diagnosis present

## 2018-01-08 DIAGNOSIS — R197 Diarrhea, unspecified: Secondary | ICD-10-CM | POA: Diagnosis present

## 2018-01-08 DIAGNOSIS — I10 Essential (primary) hypertension: Secondary | ICD-10-CM | POA: Diagnosis present

## 2018-01-08 LAB — CBG MONITORING, ED
Glucose-Capillary: 49 mg/dL — ABNORMAL LOW (ref 70–99)
Glucose-Capillary: 26 mg/dL — CL (ref 70–99)
Glucose-Capillary: 50 mg/dL — ABNORMAL LOW (ref 70–99)
Glucose-Capillary: 69 mg/dL — ABNORMAL LOW (ref 70–99)
Glucose-Capillary: 81 mg/dL (ref 70–99)
Glucose-Capillary: 93 mg/dL (ref 70–99)
Glucose-Capillary: 139 mg/dL — ABNORMAL HIGH (ref 70–99)

## 2018-01-08 LAB — LACTIC ACID, PLASMA
Lactic Acid, Venous: 1.4 mmol/L (ref 0.5–1.9)
Lactic Acid, Venous: 1.5 mmol/L (ref 0.5–1.9)

## 2018-01-08 LAB — GLUCOSE, CAPILLARY
GLUCOSE-CAPILLARY: 120 mg/dL — AB (ref 70–99)
GLUCOSE-CAPILLARY: 130 mg/dL — AB (ref 70–99)
Glucose-Capillary: 88 mg/dL (ref 70–99)
Glucose-Capillary: 97 mg/dL (ref 70–99)
Glucose-Capillary: 134 mg/dL — ABNORMAL HIGH (ref 70–99)

## 2018-01-08 MED ORDER — ONDANSETRON HCL 4 MG/2ML IJ SOLN
4.0000 mg | Freq: Once | INTRAMUSCULAR | Status: AC
  Administered 2018-01-08: 4 mg via INTRAVENOUS
  Filled 2018-01-08: qty 2

## 2018-01-08 MED ORDER — SODIUM CHLORIDE 0.9% FLUSH: 3.0000 mL | Freq: Two times a day (BID) | INTRAVENOUS | Status: DC

## 2018-01-08 MED ORDER — DORZOLAMIDE HCL-TIMOLOL MAL 2-0.5 % OP SOLN
1.0000 [drp] | Freq: Two times a day (BID) | OPHTHALMIC | Status: DC
  Administered 2018-01-08 – 2018-01-11 (×6): 1 [drp] via OPHTHALMIC
  Filled 2018-01-08: qty 10

## 2018-01-08 MED ORDER — SODIUM CHLORIDE 0.9 % IV SOLN: 250.0000 mL | INTRAVENOUS | Status: DC | PRN

## 2018-01-08 MED ORDER — PRAVASTATIN SODIUM 40 MG PO TABS
40.0000 mg | ORAL_TABLET | Freq: Every day | ORAL | Status: DC
  Administered 2018-01-08 – 2018-01-11 (×4): 40 mg via ORAL
  Filled 2018-01-08 (×4): qty 1

## 2018-01-08 MED ORDER — VITAMIN D 25 MCG (1000 UNIT) PO TABS
1000.0000 [IU] | ORAL_TABLET | Freq: Every day | ORAL | Status: DC
  Administered 2018-01-08 – 2018-01-11 (×4): 1000 [IU] via ORAL
  Filled 2018-01-08 (×4): qty 1

## 2018-01-08 MED ORDER — ONDANSETRON HCL 4 MG PO TABS
4.0000 mg | ORAL_TABLET | Freq: Four times a day (QID) | ORAL | Status: DC | PRN
  Administered 2018-01-09: 4 mg via ORAL
  Filled 2018-01-08: qty 1

## 2018-01-08 MED ORDER — ONDANSETRON HCL 4 MG/2ML IJ SOLN: 4.0000 mg | Freq: Four times a day (QID) | INTRAMUSCULAR | Status: DC | PRN

## 2018-01-08 MED ORDER — DEXTROSE-NACL 5-0.45 % IV SOLN
INTRAVENOUS | Status: DC
  Administered 2018-01-08: 13:00:00 via INTRAVENOUS

## 2018-01-08 MED ORDER — DEXTROSE 10 % IV SOLN
100.0000 mL | Freq: Once | INTRAVENOUS | Status: AC
  Administered 2018-01-08: 100 mL via INTRAVENOUS

## 2018-01-08 MED ORDER — GLUCOSE 4 G PO CHEW
CHEWABLE_TABLET | ORAL | Status: AC
  Administered 2018-01-08: 2
  Filled 2018-01-08: qty 1

## 2018-01-08 MED ORDER — ASPIRIN EC 81 MG PO TBEC
81.0000 mg | DELAYED_RELEASE_TABLET | Freq: Every day | ORAL | Status: DC
  Administered 2018-01-08 – 2018-01-11 (×4): 81 mg via ORAL
  Filled 2018-01-08 (×4): qty 1

## 2018-01-08 MED ORDER — SODIUM CHLORIDE 0.9% FLUSH: 3.0000 mL | INTRAVENOUS | Status: DC | PRN

## 2018-01-08 MED ORDER — DEXTROSE 50 % IV SOLN
0.5000 | Freq: Once | INTRAVENOUS | Status: AC
  Administered 2018-01-08: 25 mL via INTRAVENOUS
  Filled 2018-01-08: qty 50

## 2018-01-08 NOTE — Progress Notes (Signed)
PT Cancellation Note  Patient Details Name: Adrienne Gonzalez MRN: [id number] DOB: Sep 03, 1925   Cancelled Treatment:    Reason Eval/Treat Not Completed: Other (comment). Pt and pt family politely declining therapy evaluation at this time secondary to diarrhea. Agreeable for evaluation tomorrow.  Laurina Bustle, PT, DPT Acute Rehabilitation Services Pager [phone] Office [phone]    Vanetta Mulders 01/08/2018, 4:45 PM

## 2018-01-08 NOTE — ED Notes (Signed)
CBG of 26 that just resulted is not recent. CBG of 26 was reading prior to 0700. PA aware.

## 2018-01-08 NOTE — ED Provider Notes (Signed)
[facility] EMERGENCY DEPARTMENT Provider Note   CSN: [id number] Arrival date & time: 01/07/18  1827     History   Chief Complaint Chief Complaint  Patient presents with  . Dehydration  . Emesis  . Diarrhea    HPI Adrienne Gonzalez is a 85 y.o. female with a past medical history of insulin-dependent diabetes, hypertension who presents to ED for nausea, vomiting and watery diarrhea since 12/29/2017.  Daughter at bedside provides much of history.  States that the patient's blood sugars have been running low due to her lack of appetite.  She has decreased her insulin use and discontinue glimepiride for the past several days.  She last got half dose of insulin about 24 hours ago.  Daughter states that she will have anywhere from 9-10 episodes of watery diarrhea which is worse between the hours of 10 PM and 6 AM.  She was seen by her PCP prior to arrival and was told to come to the ED for possible dehydration.  No sick contacts with similar symptoms.  She was given antiemetics by her PCP about 3 days ago but daughter is unable to recall the name.  States that this has helped with her nausea and vomiting but she continues to have diarrhea.  She denies any abdominal pain, chest pain, urinary symptoms, fever, recent antibiotic use or medication changes.  HPI  Past Medical History:  Diagnosis Date  . Diabetes mellitus without complication (HCC)   . Hypertension     There are no active problems to display for this patient.   History reviewed. No pertinent surgical history.   OB History   No obstetric history on file.      Home Medications    Prior to Admission medications   Medication Sig Start Date End Date Taking? Authorizing Provider  aspirin EC 81 MG tablet Take 81 mg by mouth daily.   Yes [provider]  bimatoprost (LUMIGAN) 0.01 % SOLN Place 1 drop into both eyes at bedtime.   Yes [provider]  brimonidine (ALPHAGAN P) 0.1 % SOLN Place 1 drop  into both eyes 3 (three) times daily.   Yes [provider]  cholecalciferol (VITAMIN D3) 25 MCG (1000 UT) tablet Take 1,000 Units by mouth daily.   Yes [provider]  dorzolamide-timolol (COSOPT) 22.3-6.8 MG/ML ophthalmic solution Place 1 drop into both eyes 2 (two) times daily.   Yes [provider]  glimepiride (AMARYL) 2 MG tablet Take 2 mg by mouth daily.   Yes [provider]  Insulin Lispro Prot & Lispro (HUMALOG MIX 75/25 KWIKPEN [place]) Inject 30 Units into the skin every morning.   Yes [provider]  olmesartan (BENICAR) 40 MG tablet Take 40 mg by mouth daily.   Yes [provider]  pravastatin (PRAVACHOL) 40 MG tablet Take 40 mg by mouth daily.   Yes [provider]  saxagliptin HCl (ONGLYZA) 2.5 MG TABS tablet Take 2.5 mg by mouth daily.   Yes [provider]    Family History No family history on file.  Social History Social History   Tobacco Use  . Smoking status: Never Smoker  Substance Use Topics  . Alcohol use: Never    Frequency: Never  . Drug use: Never     Allergies   Benzocaine; Metformin and related; and Morphine and related   Review of Systems Review of Systems  Constitutional: Negative for appetite change, chills and fever.  HENT: Negative for ear pain,  rhinorrhea, sneezing and sore throat.   Eyes: Negative for photophobia and visual disturbance.  Respiratory: Negative for cough, chest tightness, shortness of breath and wheezing.   Cardiovascular: Negative for chest pain and palpitations.  Gastrointestinal: Positive for diarrhea, nausea and vomiting. Negative for abdominal pain, blood in stool and constipation.  Genitourinary: Negative for dysuria, hematuria and urgency.  Musculoskeletal: Negative for myalgias.  Skin: Negative for rash.  Neurological: Negative for dizziness, weakness and light-headedness.     Physical Exam Updated Vital Signs BP (!) 104/58   Pulse 75   Temp  97.7 F (36.5 C) (Oral)   Resp 14   Ht 4' 11.5" (1.511 m)   Wt 59 kg   SpO2 100%   BMI 25.82 kg/m   Physical Exam Vitals signs and nursing note reviewed.  Constitutional:      General: She is not in acute distress.    Appearance: She is well-developed.  HENT:     Head: Normocephalic and atraumatic.     Nose: Nose normal.     Mouth/Throat:     Mouth: Mucous membranes are dry.  Eyes:     General: No scleral icterus.       Right eye: No discharge.        Left eye: No discharge.     Conjunctiva/sclera: Conjunctivae normal.  Neck:     Musculoskeletal: Normal range of motion and neck supple.  Cardiovascular:     Rate and Rhythm: Normal rate and regular rhythm.     Heart sounds: Normal heart sounds. No murmur. No friction rub. No gallop.   Pulmonary:     Effort: Pulmonary effort is normal. No respiratory distress.     Breath sounds: Normal breath sounds.  Abdominal:     General: Bowel sounds are normal. There is no distension.     Palpations: Abdomen is soft.     Tenderness: There is no abdominal tenderness. There is no guarding.  Musculoskeletal: Normal range of motion.  Skin:    General: Skin is warm and dry.     Findings: No rash.  Neurological:     Mental Status: She is alert.     Motor: No abnormal muscle tone.     Coordination: Coordination normal.      ED Treatments / Results  Labs (all labs ordered are listed, but only abnormal results are displayed) Labs Reviewed  COMPREHENSIVE METABOLIC PANEL - Abnormal; Notable for the following components:      Result Value   CO2 14 (*)    Glucose, Bld 57 (*)    Creatinine, Ser 1.65 (*)    Calcium 8.5 (*)    Total Protein 6.0 (*)    Albumin 3.3 (*)    Alkaline Phosphatase 37 (*)    GFR calc non Af Amer 27 (*)    GFR calc Af Amer 31 (*)    All other components within normal limits  CBC - Abnormal; Notable for the following components:   Hemoglobin 11.6 (*)    All other components within normal limits  URINALYSIS,  ROUTINE W REFLEX MICROSCOPIC - Abnormal; Notable for the following components:   APPearance HAZY (*)    Ketones, ur 5 (*)    All other components within normal limits  CBG MONITORING, ED - Abnormal; Notable for the following components:   Glucose-Capillary 49 (*)    All other components within normal limits  CBG MONITORING, ED - Abnormal; Notable for the following components:   Glucose-Capillary 50 (*)  All other components within normal limits  CBG MONITORING, ED - Abnormal; Notable for the following components:   Glucose-Capillary 69 (*)    All other components within normal limits  CBG MONITORING, ED - Abnormal; Notable for the following components:   Glucose-Capillary 26 (*)    All other components within normal limits  CBG MONITORING, ED - Abnormal; Notable for the following components:   Glucose-Capillary 139 (*)    All other components within normal limits  GASTROINTESTINAL PANEL BY PCR, STOOL (REPLACES STOOL CULTURE)  LIPASE, BLOOD  CBG MONITORING, ED  CBG MONITORING, ED  CBG MONITORING, ED    EKG None  Radiology No results found.  Procedures Procedures (including critical care time)  CRITICAL CARE Performed by: Dietrich PatesHina Shontez Sermon   Total critical care time: 45 minutes  Critical care time was exclusive of separately billable procedures and treating other patients.  Critical care was necessary to treat or prevent imminent or life-threatening deterioration.  Critical care was time spent personally by me on the following activities: development of treatment plan with patient and/or surrogate as well as nursing, discussions with consultants, evaluation of patient's response to treatment, examination of patient, obtaining history from patient or surrogate, ordering and performing treatments and interventions, ordering and review of laboratory studies, ordering and review of radiographic studies, pulse oximetry and re-evaluation of patient's condition.   Medications  Ordered in ED Medications  sodium chloride flush (NS) 0.9 % injection 3 mL (has no administration in time range)  sodium chloride flush (NS) 0.9 % injection 3 mL (has no administration in time range)  0.9 %  sodium chloride infusion (has no administration in time range)  glucose 4 GM chewable tablet (2 tablets  Given 01/08/18 0600)  dextrose 10 % infusion (0 mLs Intravenous Stopped 01/08/18 0656)  ondansetron (ZOFRAN) injection 4 mg (4 mg Intravenous Given 01/08/18 0810)  dextrose 50 % solution 25 mL (25 mLs Intravenous Given 01/08/18 0813)     Initial Impression / Assessment and Plan / ED Course  I have reviewed the triage vital signs and the nursing notes.  Pertinent labs & imaging results that were available during my care of the patient were reviewed by me and considered in my medical decision making (see chart for details).     85 year old female with a past medical history of insulin-dependent diabetes, hypertension presents to ED for nausea, vomiting and 9-10 episodes of watery diarrhea daily since Christmas Eve.  Sent here by her PCP for possible dehydration.  Daughter has decreased her insulin dosing and glimepiride.  However, patient glucose level dropped to 40s here in the ED.  She appears dehydrated.  No abdominal tenderness to palpation.  Other lab work significant for creatinine of 1.65, CO2 of 14.  Urinalysis is unremarkable.  Patient will be started on a dextrose infusion. Will check CBG after infusion.  Last CBG is 139.  However, patient continues to endorse decreased appetite and weakness.  I feel that she will need to be admitted for dehydration and hypoglycemia.  Hospitalist to admit.    Portions of this note were generated with Scientist, clinical (histocompatibility and immunogenetics)Dragon dictation software. Dictation errors may occur despite best attempts at proofreading.  Final Clinical Impressions(s) / ED Diagnoses   Final diagnoses:  Dehydration  Hypoglycemia  Diarrhea, unspecified type    ED Discharge Orders    None         Dietrich PatesKhatri, Alley Neils, PA-C 01/08/18 1115    Cardama, Amadeo GarnetPedro Eduardo, MD 01/09/18 [phone]

## 2018-01-08 NOTE — Progress Notes (Signed)
Inpatient Diabetes Program Recommendations  AACE/ADA: New Consensus Statement on Inpatient Glycemic Control (2015)  Target Ranges:  Prepandial:   less than 140 mg/dL      Peak postprandial:   less than 180 mg/dL (1-2 hours)      Critically ill patients:  140 - 180 mg/dL   Lab Results  Component Value Date   GLUCAP 88 01/08/2018    Review of Glycemic Control  Diabetes history: DM2 Outpatient Diabetes medications: Humalog 75/25 insulin mix 30 units q am + Amaryl 2 mg qd + Onglyza 2.5 mg Current orders for Inpatient glycemic control: None  Inpatient Diabetes Program Recommendations:   Reviewed CBGs and agree with review D/C medications @ time of discharge. Will follow.  Thank you, Adrienne Gonzalez. Velmer Broadfoot, RN, MSN, CDE  Diabetes Coordinator Inpatient Glycemic Control Team Team Pager [phone] (8am-5pm) 01/08/2018 1:01 PM

## 2018-01-08 NOTE — ED Notes (Signed)
Adrienne Gonzalez [phone] (Daughter)

## 2018-01-08 NOTE — ED Notes (Signed)
Patient informed of need for stool sample

## 2018-01-08 NOTE — ED Notes (Signed)
Upon assessment pt's glucose was low once admitted to room. Given 8 oz of OJ and rechecked, in which glucose decreased to 26. Pt was a difficult stick and RN and IV team attempted to start line during hypoglycemia event. Due to being unable to find an IV site for an extended time pt was given 2 glucose tabs and another 8 oz of OJ with crackers. MD was notified and present during event. Once line was placed pt started on D 10 fluids and glucose rose to 81.

## 2018-01-08 NOTE — H&P (Signed)
History and Physical    Adrienne Gonzalez PTE:[id number] DOB: 13-May-1925 DOA: 01/07/2018  PCP: System, Pcp Not In  Patient coming from: Home  Chief Complaint: Diarrhea  HPI: Adrienne Gonzalez is a 85 y.o. female with medical history significant of hypertension and diabetes insulin-dependent comes in with 10 days of nausea vomiting and diarrhea.  Her vomiting has resolved she still nauseous but her diarrhea has persisted.  She is having over 10 watery stools a day.  No fevers no abdominal pain.  Patient is normally on insulin but her family stopped giving her insulin over a day ago because she was not eating very well.  She waited in the emergency department waiting room very long last night her sugar dropped to the 20s.  Patient is being referred for admission for hypoglycemia and acute kidney injury secondary to dehydration from GI volume losses.  Review of Systems: As per HPI otherwise 10 point review of systems negative.   Past Medical History:  Diagnosis Date  . Diabetes mellitus without complication (HCC)   . Hypertension     History reviewed. No pertinent surgical history.   reports that she has never smoked. She does not have any smokeless tobacco history on file. She reports that she does not drink alcohol or use drugs.  Allergies  Allergen Reactions  . Benzocaine Other (See Comments)    confusion  . Metformin And Related Diarrhea  . Morphine And Related Itching    No family history on file.  No premature coronary artery disease  Prior to Admission medications   Medication Sig Start Date End Date Taking? Authorizing Provider  aspirin EC 81 MG tablet Take 81 mg by mouth daily.   Yes [provider]  bimatoprost (LUMIGAN) 0.01 % SOLN Place 1 drop into both eyes at bedtime.   Yes [provider]  brimonidine (ALPHAGAN P) 0.1 % SOLN Place 1 drop into both eyes 3 (three) times daily.   Yes [provider]  cholecalciferol (VITAMIN D3) 25 MCG (1000 UT) tablet Take  1,000 Units by mouth daily.   Yes [provider]  dorzolamide-timolol (COSOPT) 22.3-6.8 MG/ML ophthalmic solution Place 1 drop into both eyes 2 (two) times daily.   Yes [provider]  glimepiride (AMARYL) 2 MG tablet Take 2 mg by mouth daily.   Yes [provider]  Insulin Lispro Prot & Lispro (HUMALOG MIX 75/25 KWIKPEN [place]) Inject 30 Units into the skin every morning.   Yes [provider]  olmesartan (BENICAR) 40 MG tablet Take 40 mg by mouth daily.   Yes [provider]  pravastatin (PRAVACHOL) 40 MG tablet Take 40 mg by mouth daily.   Yes [provider]  saxagliptin HCl (ONGLYZA) 2.5 MG TABS tablet Take 2.5 mg by mouth daily.   Yes [provider]    Physical Exam: Vitals:   01/08/18 1015 01/08/18 1030 01/08/18 1045 01/08/18 1100  BP:  118/70  (!) 104/58  Pulse: 76 76 77 75  Resp: 15 13 12 14   Temp:      TempSrc:      SpO2: 100% 100% 100% 100%  Weight:      Height:          Constitutional: NAD, calm, comfortable Vitals:   01/08/18 1015 01/08/18 1030 01/08/18 1045 01/08/18 1100  BP:  118/70  (!) 104/58  Pulse: 76 76 77 75  Resp: 15 13 12 14   Temp:      TempSrc:      SpO2:  100% 100% 100% 100%  Weight:      Height:       Eyes: PERRL, lids and conjunctivae normal ENMT: Mucous membranes are moist. Posterior pharynx clear of any exudate or lesions.Normal dentition.  Neck: normal, supple, no masses, no thyromegaly Respiratory: clear to auscultation bilaterally, no wheezing, no crackles. Normal respiratory effort. No accessory muscle use.  Cardiovascular: Regular rate and rhythm, no murmurs / rubs / gallops. No extremity edema. 2+ pedal pulses. No carotid bruits.  Abdomen: no tenderness, no masses palpated. No hepatosplenomegaly. Bowel sounds positive.  Musculoskeletal: no clubbing / cyanosis. No joint deformity upper and lower extremities. Good ROM, no contractures. Normal muscle tone.  Skin: no rashes, lesions,  ulcers. No induration Neurologic: CN 2-12 grossly intact. Sensation intact, DTR normal. Strength 5/5 in all 4.  Psychiatric: Normal judgment and insight. Alert and oriented x 3. Normal mood.    Labs on Admission: I have personally reviewed following labs and imaging studies  CBC: Recent Labs  Lab 01/07/18 2003  WBC 6.4  HGB 11.6*  HCT 37.0  MCV 93.2  PLT 241   Basic Metabolic Panel: Recent Labs  Lab 01/07/18 2003  NA 136  K 4.1  CL 110  CO2 14*  GLUCOSE 57*  BUN 23  CREATININE 1.65*  CALCIUM 8.5*   GFR: Estimated Creatinine Clearance: 18 mL/min (A) (by C-G formula based on SCr of 1.65 mg/dL (H)). Liver Function Tests: Recent Labs  Lab 01/07/18 2003  AST 29  ALT 38  ALKPHOS 37*  BILITOT 0.6  PROT 6.0*  ALBUMIN 3.3*   Recent Labs  Lab 01/07/18 2003  LIPASE 12   No results for input(s): AMMONIA in the last 168 hours. Coagulation Profile: No results for input(s): INR, PROTIME in the last 168 hours. Cardiac Enzymes: No results for input(s): CKTOTAL, CKMB, CKMBINDEX, TROPONINI in the last 168 hours. BNP (last 3 results) No results for input(s): PROBNP in the last 8760 hours. HbA1C: No results for input(s): HGBA1C in the last 72 hours. CBG: Recent Labs  Lab 01/08/18 0606 01/08/18 0621 01/08/18 0648 01/08/18 0715 01/08/18 1018  GLUCAP 50* 69* 81 93 139*   Lipid Profile: No results for input(s): CHOL, HDL, LDLCALC, TRIG, CHOLHDL, LDLDIRECT in the last 72 hours. Thyroid Function Tests: No results for input(s): TSH, T4TOTAL, FREET4, T3FREE, THYROIDAB in the last 72 hours. Anemia Panel: No results for input(s): VITAMINB12, FOLATE, FERRITIN, TIBC, IRON, RETICCTPCT in the last 72 hours. Urine analysis:    Component Value Date/Time   COLORURINE YELLOW 01/07/2018 1856   APPEARANCEUR HAZY (A) 01/07/2018 1856   LABSPEC 1.017 01/07/2018 1856   PHURINE 5.0 01/07/2018 1856   GLUCOSEU NEGATIVE 01/07/2018 1856   HGBUR NEGATIVE 01/07/2018 1856   BILIRUBINUR  NEGATIVE 01/07/2018 1856   KETONESUR 5 (A) 01/07/2018 1856   PROTEINUR NEGATIVE 01/07/2018 1856   UROBILINOGEN 0.2 06/28/2007 2238   NITRITE NEGATIVE 01/07/2018 1856   LEUKOCYTESUR NEGATIVE 01/07/2018 1856   Sepsis Labs: !!!!!!!!!!!!!!!!!!!!!!!!!!!!!!!!!!!!!!!!!!!! @LABRCNTIP (procalcitonin:4,lacticidven:4) )No results found for this or any previous visit (from the past 240 hour(s)).   Radiological Exams on Admission: No results found.  Old chart reviewed Case discussed with EDP  Assessment/Plan 85 year old female with recent GI illness comes in with acute kidney injury secondary to dehydration and hypoglycemia  Principal Problem:   Hypoglycemia-secondary to poor p.o. intake and recent insulin usage.  Stop all insulin products.  Check every 2 hour glucose and placed on D5 drip.  Her last 2 readings have been normal.  Give  her food.  Work-up GI illness as below.  Active Problems:   Diarrhea-GI panel is pending.  Will obtain a CT of her abdomen pelvis to make sure she does not have an underlying infection.  We will also check a lactic acid level.    AKI (acute kidney injury) (HCC)-secondary to GI volume losses.  Check stool.  Placed on D5 drip.  Do not have a baseline creatinine but her creatinine is bumped up to 1.65 so assuming this is acute.  Repeat BMP in the morning.    Diabetes mellitus without complication (HCC)-currently hypoglycemic holding all diabetic medications at this time.    Hypertension-holding all BP meds secondary to the above issues.     DVT prophylaxis: SCDs Code Status: Full Family Communication: Daughter Disposition Plan: 1 to 2 days Consults called: None Admission status: Observation   Takota Cahalan A MD [facility]  If 7PM-7AM, please contact night-coverage [email or web address] Password [email or web address]  01/08/2018, 11:58 AM

## 2018-01-09 DIAGNOSIS — R197 Diarrhea, unspecified: Secondary | ICD-10-CM

## 2018-01-09 DIAGNOSIS — E119 Type 2 diabetes mellitus without complications: Secondary | ICD-10-CM

## 2018-01-09 DIAGNOSIS — E162 Hypoglycemia, unspecified: Secondary | ICD-10-CM | POA: Diagnosis not present

## 2018-01-09 DIAGNOSIS — E860 Dehydration: Secondary | ICD-10-CM | POA: Diagnosis not present

## 2018-01-09 DIAGNOSIS — E872 Acidosis, unspecified: Secondary | ICD-10-CM | POA: Diagnosis present

## 2018-01-09 DIAGNOSIS — N179 Acute kidney failure, unspecified: Secondary | ICD-10-CM | POA: Diagnosis not present

## 2018-01-09 LAB — GASTROINTESTINAL PANEL BY PCR, STOOL (REPLACES STOOL CULTURE)

## 2018-01-09 LAB — BASIC METABOLIC PANEL
Anion gap: 4 — ABNORMAL LOW (ref 5–15)
BUN: 15 mg/dL (ref 8–23)
CO2: 18 mmol/L — ABNORMAL LOW (ref 22–32)
Calcium: 8 mg/dL — ABNORMAL LOW (ref 8.9–10.3)
Chloride: 113 mmol/L — ABNORMAL HIGH (ref 98–111)
Creatinine, Ser: 1.53 mg/dL — ABNORMAL HIGH (ref 0.44–1.00)
GFR calc Af Amer: 34 mL/min — ABNORMAL LOW (ref >60)
GFR calc non Af Amer: 30 mL/min — ABNORMAL LOW (ref >60)
GLUCOSE: 150 mg/dL — AB (ref 70–99)
Potassium: 4.5 mmol/L (ref 3.5–5.1)
Sodium: 135 mmol/L (ref 135–145)

## 2018-01-09 LAB — CBC
HCT: 32.5 % — ABNORMAL LOW (ref 36.0–46.0)
Hemoglobin: 10.7 g/dL — ABNORMAL LOW (ref 12.0–15.0)
MCH: 30.8 pg (ref 26.0–34.0)
MCHC: 32.9 g/dL (ref 30.0–36.0)
MCV: 93.7 fL (ref 80.0–100.0)
Platelets: 219 10*3/uL (ref 150–400)
RBC: 3.47 MIL/uL — ABNORMAL LOW (ref 3.87–5.11)
RDW: 13.6 % (ref 11.5–15.5)
WBC: 5.6 10*3/uL (ref 4.0–10.5)
nRBC: 0 % (ref 0.0–0.2)

## 2018-01-09 LAB — GLUCOSE, CAPILLARY
GLUCOSE-CAPILLARY: 159 mg/dL — AB (ref 70–99)
Glucose-Capillary: 145 mg/dL — ABNORMAL HIGH (ref 70–99)
Glucose-Capillary: 139 mg/dL — ABNORMAL HIGH (ref 70–99)
Glucose-Capillary: 153 mg/dL — ABNORMAL HIGH (ref 70–99)
Glucose-Capillary: 133 mg/dL — ABNORMAL HIGH (ref 70–99)
Glucose-Capillary: 123 mg/dL — ABNORMAL HIGH (ref 70–99)

## 2018-01-09 MED ORDER — ONDANSETRON HCL 4 MG/2ML IJ SOLN
4.0000 mg | INTRAMUSCULAR | Status: AC
  Administered 2018-01-09 (×3): 4 mg via INTRAVENOUS
  Filled 2018-01-09 (×4): qty 2

## 2018-01-09 MED ORDER — ONDANSETRON HCL 4 MG PO TABS
4.0000 mg | ORAL_TABLET | ORAL | Status: AC
  Administered 2018-01-10: 4 mg via ORAL
  Filled 2018-01-09: qty 1

## 2018-01-09 MED ORDER — LATANOPROST 0.005 % OP SOLN
1.0000 [drp] | Freq: Every day | OPHTHALMIC | Status: DC
  Administered 2018-01-09 – 2018-01-10 (×2): 1 [drp] via OPHTHALMIC
  Filled 2018-01-09: qty 2.5

## 2018-01-09 MED ORDER — SODIUM CHLORIDE 0.9 % IV SOLN
INTRAVENOUS | Status: AC
  Administered 2018-01-09: 17:00:00 via INTRAVENOUS

## 2018-01-09 MED ORDER — BRIMONIDINE TARTRATE 0.15 % OP SOLN
1.0000 [drp] | Freq: Three times a day (TID) | OPHTHALMIC | Status: DC
  Administered 2018-01-09 – 2018-01-11 (×7): 1 [drp] via OPHTHALMIC
  Filled 2018-01-09: qty 5

## 2018-01-09 NOTE — Evaluation (Signed)
Physical Therapy Evaluation Patient Details Name: Adrienne Gonzalez MRN: [id number] DOB: 1925/02/25 Today's Date: 01/09/2018   History of Present Illness  Adrienne Handlerrene Kopka is a 85 y.o. female with medical history significant of hypertension and diabetes insulin-dependent comes in with 10 days of nausea vomiting and diarrhea.  Her vomiting has resolved she still nauseous but her diarrhea has persisted.  She is having over 10 watery stools a day.  admit with hypoglycemia.    Clinical Impression  Pt admitted with above diagnosis. Pt currently with functional limitations due to the deficits listed below (see PT Problem List). Pt was able to ambulate with RW with good stability.  NEeded min assist without RW due to unsteady on feet.  Son states that she will have 24 hour care and most likely would not need a RW for home. He does want to get pt a walk in bath and lift chair and has been trying to see if MD can assist with getting these ordered.  Other recommendations below.  Will continue PT.   Pt will benefit from skilled PT to increase their independence and safety with mobility to allow discharge to the venue listed below.      Follow Up Recommendations Home health PT;Supervision/Assistance - 24 hour(HHOT)    Equipment Recommendations  3in1 (PT)(walk in bath, lift chair)    Recommendations for Other Services       Precautions / Restrictions Precautions Precautions: Fall Restrictions Weight Bearing Restrictions: No      Mobility  Bed Mobility Overal bed mobility: Independent                Transfers Overall transfer level: Needs assistance Equipment used: Rolling walker (2 wheeled) Transfers: Sit to/from Stand Sit to Stand: Min guard         General transfer comment: STeadying assist once on feet due to slight instability.   Ambulation/Gait Ambulation/Gait assistance: Min guard;Min assist Gait Distance (Feet): 150 Feet Assistive device: Rolling walker (2 wheeled) Gait  Pattern/deviations: Step-through pattern;Decreased stride length;Trunk flexed;Wide base of support   Gait velocity interpretation: <1.31 ft/sec, indicative of household ambulator General Gait Details: Pt was able to ambulate but needed bil UE support initially holding onto IV pole.  Obtained RW and pt did welll with RW as well however son states she may not be able to use RW at home with her visual issues.  Pt will have 24 hour care at home per son therefore encouraged him to make sure that someone walks with her at all times.  Son agrees.  C/o need to use bathroom therefore walked in to bathroom and pt with diarrhea. Also felt nauseated.  Nurse brought in nausea meds.   Stairs            Wheelchair Mobility    Modified Rankin (Stroke Patients Only)       Balance Overall balance assessment: Needs assistance Sitting-balance support: No upper extremity supported;Feet supported Sitting balance-Leahy Scale: Fair     Standing balance support: Bilateral upper extremity supported;During functional activity Standing balance-Leahy Scale: Poor Standing balance comment: relies on UE support                             Pertinent Vitals/Pain Pain Assessment: No/denies pain    Home Living Family/patient expects to be discharged to:: Private residence Living Arrangements: Children(daughter) Available Help at Discharge: Family Type of Home: House Home Access: Stairs to enter Entrance Stairs-Rails: Doctor, general practiceight;Left Entrance Stairs-Number of  Steps: 7 Home Layout: Laundry or work area in basement;Able to live on main level with bedroom/bathroom Home Equipment: Tub bench      Prior Function Level of Independence: Independent         Comments: Pt very active and enjoys stationary biking 3x/week      Hand Dominance   Dominant Hand: Right    Extremity/Trunk Assessment   Upper Extremity Assessment Upper Extremity Assessment: Defer to OT evaluation    Lower Extremity  Assessment Lower Extremity Assessment: Generalized weakness    Cervical / Trunk Assessment Cervical / Trunk Assessment: Kyphotic  Communication   Communication: No difficulties  Cognition Arousal/Alertness: Awake/alert Behavior During Therapy: WFL for tasks assessed/performed Overall Cognitive Status: History of cognitive impairments - at baseline                                 General Comments: Pt oriented to self and year only.       General Comments      Exercises     Assessment/Plan    PT Assessment Patient needs continued PT services  PT Problem List Decreased activity tolerance;Decreased balance;Decreased mobility;Decreased knowledge of use of DME;Decreased safety awareness;Decreased knowledge of precautions;Decreased cognition       PT Treatment Interventions DME instruction;Gait training;Functional mobility training;Therapeutic activities;Therapeutic exercise;Balance training;Patient/family education;Stair training    PT Goals (Current goals can be found in the Care Plan section)  Acute Rehab PT Goals Patient Stated Goal: to go home PT Goal Formulation: With patient Time For Goal Achievement: 01/23/18 Potential to Achieve Goals: Good    Frequency Min 3X/week   Barriers to discharge        Co-evaluation               AM-PAC PT "6 Clicks" Mobility  Outcome Measure Help needed turning from your back to your side while in a flat bed without using bedrails?: None Help needed moving from lying on your back to sitting on the side of a flat bed without using bedrails?: None Help needed moving to and from a bed to a chair (including a wheelchair)?: A Little Help needed standing up from a chair using your arms (e.g., wheelchair or bedside chair)?: A Little Help needed to walk in hospital room?: A Little Help needed climbing 3-5 steps with a railing? : A Lot 6 Click Score: 19    End of Session Equipment Utilized During Treatment: Gait  belt Activity Tolerance: Patient limited by fatigue Patient left: with call bell/phone within reach;with nursing/sitter in room;with family/visitor present(on toilet) Nurse Communication: Mobility status(aware pt left on toilet) PT Visit Diagnosis: Unsteadiness on feet (R26.81);Muscle weakness (generalized) (M62.81)    Time: 6195-0932 PT Time Calculation (min) (ACUTE ONLY): 24 min   Charges:   PT Evaluation $PT Eval Moderate Complexity: 1 Mod PT Treatments $Gait Training: 8-22 mins        Aubery Date,PT Acute Rehabilitation Services Pager:  [phone]  Office:  [phone]    Berline Lopes 01/09/2018, 11:48 AM

## 2018-01-09 NOTE — Progress Notes (Signed)
[facility] PROGRESS NOTE  Adrienne Handlerrene Weant ZOX:[id number] DOB: 1925/02/16 DOA: 01/07/2018 PCP: System, Pcp Not In  Assessment/Plan:  Acute kidney injury related to dehydration in setting of acute gi illness resulting in volume losses. Creatinine 1.65 on admission. Trending down this am.  -continue IV fluids -hold nephrotoxins -monitor urine output -recheck in am  Hypoglycemia-secondary to poor p.o. intake and recent insulin usage in setting of acute kidney injury. Home meds include glimepiride and Humalog mix 75/25. CBG's trending up this am. GI pathogen panel pending.  -continue D5 gtt at lower dose -cbg q 4 hours -continue holding insulin and glimepiride -consider discontinuing glimepiride at discharge -obtain HgA1c  Active Problems:   Diarrhea-GI panel is pending.continues with frequent episodes loose stool. CT abdomen with Diverticulosis without diverticulitis.Fluid and fecal material throughout the colon consistent with the given clinical history of diarrhea. Lactic acid within limits of normal -follow gi pathogen panel -intake and output -supportive theapy    Diabetes mellitus without complication (HCC)-currently hypoglycemic holding all diabetic medications at this time.    Hypertension- fair control. holding all BP meds secondary to the above issues.   Code Status: full Family Communication: son at bedside Disposition Plan: home when ready   Consultants:  none  Procedures:    Antibiotics:    HPI/Subjective: Adrienne Gonzalez is a 85 y.o. female with medical history significant of hypertension and diabetes insulin-dependent admitted 01/08/18 with 10 days of nausea vomiting and diarrhea.  Her vomiting has resolved she still nauseous but her diarrhea has persisted.  She reported over 10 watery stools a day.  No fevers no abdominal pain.  Patient is normally on insulin but her family stopped giving her insulin over a day ago because she was not eating very well. While  waiting in ED sugar dropped to the 20s.  Admitted  for hypoglycemia and acute kidney injury secondary to dehydration from GI volume losses.  Reports persistent nausea without vomiting and continued diarrhea. Denies pain/discomfort  Objective: Vitals:   01/08/18 2353 01/09/18 0647  BP: 126/66 (!) 120/59  Pulse: 72 71  Resp: 18 16  Temp: 98.6 F (37 C) 98 F (36.7 C)  SpO2: 100% 99%   No intake or output data in the 24 hours ending 01/09/18 1140 Filed Weights   01/07/18 1854  Weight: 59 kg    Exam:   General:  Awake alert pleasant in no acute distress  Cardiovascular: rrr no mgr no LE edema  Respiratory: normal effort BS clear bilaterally no wheeze  Abdomen: non-distended non-tender +BS sluggish in left quadrants. No guarding or rebounding  Musculoskeletal: joints without swelling/erythema   Data Reviewed: Basic Metabolic Panel: Recent Labs  Lab 01/07/18 2003 01/09/18 0520  NA 136 135  K 4.1 4.5  CL 110 113*  CO2 14* 18*  GLUCOSE 57* 150*  BUN 23 15  CREATININE 1.65* 1.53*  CALCIUM 8.5* 8.0*   Liver Function Tests: Recent Labs  Lab 01/07/18 2003  AST 29  ALT 38  ALKPHOS 37*  BILITOT 0.6  PROT 6.0*  ALBUMIN 3.3*   Recent Labs  Lab 01/07/18 2003  LIPASE 12   No results for input(s): AMMONIA in the last 168 hours. CBC: Recent Labs  Lab 01/07/18 2003 01/09/18 0520  WBC 6.4 5.6  HGB 11.6* 10.7*  HCT 37.0 32.5*  MCV 93.2 93.7  PLT 241 219   Cardiac Enzymes: No results for input(s): CKTOTAL, CKMB, CKMBINDEX, TROPONINI in the last 168 hours. BNP (last 3 results) No results for input(s):  BNP in the last 8760 hours.  ProBNP (last 3 results) No results for input(s): PROBNP in the last 8760 hours.  CBG: Recent Labs  Lab 01/08/18 1937 01/09/18 0104 01/09/18 0350 01/09/18 0812 01/09/18 1138  GLUCAP 134* 145* 139* 159* 153*    No results found for this or any previous visit (from the past 240 hour(s)).   Studies: Ct Abdomen Pelvis Wo  Contrast  Result Date: 01/08/2018 CLINICAL DATA:  Watery diarrhea for several days EXAM: CT ABDOMEN AND PELVIS WITHOUT CONTRAST TECHNIQUE: Multidetector CT imaging of the abdomen and pelvis was performed following the standard protocol without IV contrast. COMPARISON:  08/10/2007. FINDINGS: Lower chest: No acute abnormality. Hepatobiliary: No focal liver abnormality is seen. Status post cholecystectomy. No biliary dilatation. Pancreas: Unremarkable. No pancreatic ductal dilatation or surrounding inflammatory changes. Spleen: Normal in size without focal abnormality. Adrenals/Urinary Tract: Adrenal glands are within normal limits. Kidneys demonstrate no renal calculi or obstructive change. Stable left renal cyst is noted measuring 2.1 cm. Bladder is decompressed. Stomach/Bowel: Diverticular change of the colon is noted without evidence of diverticulitis. Fluid and fecal material is noted throughout the colon. The appendix is within normal limits. No small bowel obstructive changes are seen. Vascular/Lymphatic: Aortic atherosclerosis. No enlarged abdominal or pelvic lymph nodes. Reproductive: Uterus is not well visualized and has likely been surgically removed. No adnexal mass is noted. Other: No abdominal wall hernia or abnormality. No abdominopelvic ascites. Musculoskeletal: Degenerative changes of lumbar spine are seen. No acute bony abnormality is noted. IMPRESSION: Diverticulosis without diverticulitis. Fluid and fecal material throughout the colon consistent with the given clinical history of diarrhea. Chronic changes without acute abnormality. Electronically Signed   By: Alcide Clever M.D.   On: 01/08/2018 15:42    Scheduled Meds: . aspirin EC  81 mg Oral Daily  . brimonidine  1 drop Both Eyes TID  . cholecalciferol  1,000 Units Oral Daily  . dorzolamide-timolol  1 drop Both Eyes BID  . latanoprost  1 drop Both Eyes QHS  . ondansetron  4 mg Oral Q4H   Or  . ondansetron (ZOFRAN) IV  4 mg Intravenous  Q4H  . pravastatin  40 mg Oral Daily   Continuous Infusions: . sodium chloride    . dextrose 5 % and 0.45% NaCl 25 mL/hr at 01/09/18 0234    Principal Problem:   Hypoglycemia Active Problems:   Hypertension   Diarrhea   AKI (acute kidney injury) (HCC)   Metabolic acidosis   Diabetes mellitus without complication (HCC)    Time spent:     Gwenyth Bender NP [facility] If 7PM-7AM, please contact night-coverage at [email or web address], password [facility] 01/09/2018, 11:40 AM  LOS: 0 days

## 2018-01-10 ENCOUNTER — Encounter (HOSPITAL_COMMUNITY): Payer: Self-pay | Admitting: Internal Medicine

## 2018-01-10 DIAGNOSIS — N181 Chronic kidney disease, stage 1: Secondary | ICD-10-CM | POA: Diagnosis present

## 2018-01-10 DIAGNOSIS — R197 Diarrhea, unspecified: Secondary | ICD-10-CM | POA: Diagnosis not present

## 2018-01-10 DIAGNOSIS — E162 Hypoglycemia, unspecified: Secondary | ICD-10-CM | POA: Diagnosis not present

## 2018-01-10 DIAGNOSIS — E119 Type 2 diabetes mellitus without complications: Secondary | ICD-10-CM | POA: Diagnosis not present

## 2018-01-10 DIAGNOSIS — N179 Acute kidney failure, unspecified: Secondary | ICD-10-CM | POA: Diagnosis not present

## 2018-01-10 LAB — COMPREHENSIVE METABOLIC PANEL
ALT: 21 U/L (ref 0–44)
AST: 19 U/L (ref 15–41)
Albumin: 2.6 g/dL — ABNORMAL LOW (ref 3.5–5.0)
Alkaline Phosphatase: 32 U/L — ABNORMAL LOW (ref 38–126)
Anion gap: 5 (ref 5–15)
BUN: 14 mg/dL (ref 8–23)
CO2: 17 mmol/L — ABNORMAL LOW (ref 22–32)
Calcium: 7.9 mg/dL — ABNORMAL LOW (ref 8.9–10.3)
Chloride: 116 mmol/L — ABNORMAL HIGH (ref 98–111)
Creatinine, Ser: 1.59 mg/dL — ABNORMAL HIGH (ref 0.44–1.00)
GFR calc Af Amer: 33 mL/min — ABNORMAL LOW (ref >60)
GFR calc non Af Amer: 28 mL/min — ABNORMAL LOW (ref >60)
Glucose, Bld: 147 mg/dL — ABNORMAL HIGH (ref 70–99)
POTASSIUM: 4.5 mmol/L (ref 3.5–5.1)
Sodium: 138 mmol/L (ref 135–145)
Total Bilirubin: 0.9 mg/dL (ref 0.3–1.2)
Total Protein: 4.9 g/dL — ABNORMAL LOW (ref 6.5–8.1)

## 2018-01-10 LAB — CBC
HCT: 32.4 % — ABNORMAL LOW (ref 36.0–46.0)
Hemoglobin: 10.2 g/dL — ABNORMAL LOW (ref 12.0–15.0)
MCH: 30.4 pg (ref 26.0–34.0)
MCHC: 31.5 g/dL (ref 30.0–36.0)
MCV: 96.4 fL (ref 80.0–100.0)
PLATELETS: 211 10*3/uL (ref 150–400)
RBC: 3.36 MIL/uL — ABNORMAL LOW (ref 3.87–5.11)
RDW: 13.8 % (ref 11.5–15.5)
WBC: 5.1 10*3/uL (ref 4.0–10.5)
nRBC: 0 % (ref 0.0–0.2)

## 2018-01-10 LAB — GLUCOSE, CAPILLARY
Glucose-Capillary: 117 mg/dL — ABNORMAL HIGH (ref 70–99)
Glucose-Capillary: 140 mg/dL — ABNORMAL HIGH (ref 70–99)
Glucose-Capillary: 111 mg/dL — ABNORMAL HIGH (ref 70–99)
Glucose-Capillary: 106 mg/dL — ABNORMAL HIGH (ref 70–99)

## 2018-01-10 LAB — HEMOGLOBIN A1C
Hgb A1c MFr Bld: 6.4 % — ABNORMAL HIGH (ref 4.8–5.6)
Mean Plasma Glucose: 136.98 mg/dL

## 2018-01-10 MED ORDER — INSULIN ASPART 100 UNIT/ML ~~LOC~~ SOLN: 0.0000 [IU] | Freq: Three times a day (TID) | SUBCUTANEOUS | Status: DC

## 2018-01-10 MED ORDER — SODIUM CHLORIDE 0.9 % IV SOLN
INTRAVENOUS | Status: DC
  Administered 2018-01-10: 17:00:00 via INTRAVENOUS

## 2018-01-10 MED ORDER — LOPERAMIDE HCL 2 MG PO CAPS
2.0000 mg | ORAL_CAPSULE | Freq: Once | ORAL | Status: AC
  Administered 2018-01-10: 2 mg via ORAL
  Filled 2018-01-10: qty 1

## 2018-01-10 NOTE — Progress Notes (Addendum)
[facility] PROGRESS NOTE  Adrienne Gonzalez [id number] DOB: 08-10-1925 DOA: 01/07/2018 PCP: System, Pcp Not In  Assessment/Plan:  Acute kidney injury in setting of chronic renal insufficiency stage II related to dehydration in setting of acute gi illness resulting in volume losses. Creatinine 1.65 on admission and is 1.59 today. Taking po fluids well. Baseline unclear -hold nephrotoxins -monitor urine output  Hypoglycemia-secondary to poor p.o. intake and recent insulin usage in setting of acute kidney injury superimposed on chronic renal insufficiency. Home meds include glimepiride and Humalog mix 75/25. HgA1c 6.4. CBG's continue to trend up. Will likely need to change regimen at discharge and stop oral agents -continue holding glimepiride -SSI sensative -consider discontinuing glimepiride at discharge  Active Problems: Diarrhea/nausea-GI panel is negative. Nausea resolving but appetite unreliable.  Episodes loose stool are less.  CT abdomen with Diverticulosis without diverticulitis.Fluid and fecal material throughout the colon consistent with the given clinical history of diarrhea. Lactic acid within limits of normal -imodium prn  Diabetes mellitus without complication (HCC)-HgA1c 6.4. will start sensitive SSI. Hypoglycemic on admission.  -See above  Hypertension- fair control. holding all BP meds secondary to the above issues.  Code Status: full Family Communication: daughter and grandchildren at bedside Disposition Plan: home tomorrow once    Consultants:    Procedures:    Antibiotics:  HPI/Subjective: Admitted with hypoglycemia in setting of acute on chronic kidney disease and dehydration secondary to diarhea and nause/vomiting.  Much improved to day. Ate 50% of breakfast. Less episodes stool  Objective: Vitals:   01/10/18 0027 01/10/18 0428  BP: (!) 94/52 125/64  Pulse: 71 67  Resp: 16 16  Temp: 98 F (36.7 C) 97.6 F (36.4 C)  SpO2: 99% 99%     Intake/Output Summary (Last 24 hours) at 01/10/2018 1442 Last data filed at 01/10/2018 0830 Gross per 24 hour  Intake 495.18 ml  Output -  Net 495.18 ml   Filed Weights   01/07/18 1854  Weight: 59 kg    Exam:   General:  Awake alert in no acute distress  Cardiovascular: rrr no mgr no LE edema  Respiratory: normal effort BS clear bilaterally no wheeze  Abdomen: non-distended non-tender +BS somewhat sluggish no guarding or rebounding  Musculoskeletal: joints without swelling/erythema   Data Reviewed: Basic Metabolic Panel: Recent Labs  Lab 01/07/18 2003 01/09/18 0520 01/10/18 0612  NA 136 135 138  K 4.1 4.5 4.5  CL 110 113* 116*  CO2 14* 18* 17*  GLUCOSE 57* 150* 147*  BUN 23 15 14   CREATININE 1.65* 1.53* 1.59*  CALCIUM 8.5* 8.0* 7.9*   Liver Function Tests: Recent Labs  Lab 01/07/18 2003 01/10/18 0612  AST 29 19  ALT 38 21  ALKPHOS 37* 32*  BILITOT 0.6 0.9  PROT 6.0* 4.9*  ALBUMIN 3.3* 2.6*   Recent Labs  Lab 01/07/18 2003  LIPASE 12   No results for input(s): AMMONIA in the last 168 hours. CBC: Recent Labs  Lab 01/07/18 2003 01/09/18 0520 01/10/18 0612  WBC 6.4 5.6 5.1  HGB 11.6* 10.7* 10.2*  HCT 37.0 32.5* 32.4*  MCV 93.2 93.7 96.4  PLT 241 219 211   Cardiac Enzymes: No results for input(s): CKTOTAL, CKMB, CKMBINDEX, TROPONINI in the last 168 hours. BNP (last 3 results) No results for input(s): BNP in the last 8760 hours.  ProBNP (last 3 results) No results for input(s): PROBNP in the last 8760 hours.  CBG: Recent Labs  Lab 01/09/18 1138 01/09/18 1643 01/09/18 2058 01/10/18 0235 01/10/18  1119  GLUCAP 153* 133* 123* 117* 140*    Recent Results (from the past 240 hour(s))  Gastrointestinal Panel by PCR , Stool     Status: None   Collection Time: 01/08/18  2:53 PM  Result Value Ref Range Status   Campylobacter species NOT DETECTED NOT DETECTED Final   Plesimonas shigelloides NOT DETECTED NOT DETECTED Final   Salmonella  species NOT DETECTED NOT DETECTED Final   Yersinia enterocolitica NOT DETECTED NOT DETECTED Final   Vibrio species NOT DETECTED NOT DETECTED Final   Vibrio cholerae NOT DETECTED NOT DETECTED Final   Enteroaggregative E coli (EAEC) NOT DETECTED NOT DETECTED Final   Enteropathogenic E coli (EPEC) NOT DETECTED NOT DETECTED Final   Enterotoxigenic E coli (ETEC) NOT DETECTED NOT DETECTED Final   Shiga like toxin producing E coli (STEC) NOT DETECTED NOT DETECTED Final   Shigella/Enteroinvasive E coli (EIEC) NOT DETECTED NOT DETECTED Final   Cryptosporidium NOT DETECTED NOT DETECTED Final   Cyclospora cayetanensis NOT DETECTED NOT DETECTED Final   Entamoeba histolytica NOT DETECTED NOT DETECTED Final   Giardia lamblia NOT DETECTED NOT DETECTED Final   Adenovirus F40/41 NOT DETECTED NOT DETECTED Final   Astrovirus NOT DETECTED NOT DETECTED Final   Norovirus GI/GII NOT DETECTED NOT DETECTED Final   Rotavirus A NOT DETECTED NOT DETECTED Final   Sapovirus (I, II, IV, and V) NOT DETECTED NOT DETECTED Final    Comment: Performed at [facility], [address] [place], [place] [address]     Studies: Ct Abdomen Pelvis Wo Contrast  Result Date: 01/08/2018 CLINICAL DATA:  Watery diarrhea for several days EXAM: CT ABDOMEN AND PELVIS WITHOUT CONTRAST TECHNIQUE: Multidetector CT imaging of the abdomen and pelvis was performed following the standard protocol without IV contrast. COMPARISON:  08/10/2007. FINDINGS: Lower chest: No acute abnormality. Hepatobiliary: No focal liver abnormality is seen. Status post cholecystectomy. No biliary dilatation. Pancreas: Unremarkable. No pancreatic ductal dilatation or surrounding inflammatory changes. Spleen: Normal in size without focal abnormality. Adrenals/Urinary Tract: Adrenal glands are within normal limits. Kidneys demonstrate no renal calculi or obstructive change. Stable left renal cyst is noted measuring 2.1 cm. Bladder is decompressed. Stomach/Bowel:  Diverticular change of the colon is noted without evidence of diverticulitis. Fluid and fecal material is noted throughout the colon. The appendix is within normal limits. No small bowel obstructive changes are seen. Vascular/Lymphatic: Aortic atherosclerosis. No enlarged abdominal or pelvic lymph nodes. Reproductive: Uterus is not well visualized and has likely been surgically removed. No adnexal mass is noted. Other: No abdominal wall hernia or abnormality. No abdominopelvic ascites. Musculoskeletal: Degenerative changes of lumbar spine are seen. No acute bony abnormality is noted. IMPRESSION: Diverticulosis without diverticulitis. Fluid and fecal material throughout the colon consistent with the given clinical history of diarrhea. Chronic changes without acute abnormality. Electronically Signed   By: Alcide Clever M.D.   On: 01/08/2018 15:42    Scheduled Meds: . aspirin EC  81 mg Oral Daily  . brimonidine  1 drop Both Eyes TID  . cholecalciferol  1,000 Units Oral Daily  . dorzolamide-timolol  1 drop Both Eyes BID  . insulin aspart  0-9 Units Subcutaneous TID WC  . latanoprost  1 drop Both Eyes QHS  . pravastatin  40 mg Oral Daily   Continuous Infusions:  Principal Problem:   Hypoglycemia Active Problems:   Hypertension   Diarrhea   AKI (acute kidney injury) (HCC)   Metabolic acidosis   Chronic renal insufficiency, stage 1   Diabetes  mellitus without complication (HCC)    Time spent: 45 minutes   [facility] M NP  [facility]  If 7PM-7AM, please contact night-coverage at [email or web address], password [facility] 01/10/2018, 2:42 PM  LOS: 0 days

## 2018-01-11 DIAGNOSIS — E162 Hypoglycemia, unspecified: Secondary | ICD-10-CM | POA: Diagnosis not present

## 2018-01-11 LAB — BASIC METABOLIC PANEL
Anion gap: 5 (ref 5–15)
BUN: 11 mg/dL (ref 8–23)
CO2: 14 mmol/L — AB (ref 22–32)
Calcium: 8.2 mg/dL — ABNORMAL LOW (ref 8.9–10.3)
Chloride: 121 mmol/L — ABNORMAL HIGH (ref 98–111)
Creatinine, Ser: 1.47 mg/dL — ABNORMAL HIGH (ref 0.44–1.00)
GFR calc Af Amer: 36 mL/min — ABNORMAL LOW (ref >60)
GFR calc non Af Amer: 31 mL/min — ABNORMAL LOW (ref >60)
Glucose, Bld: 124 mg/dL — ABNORMAL HIGH (ref 70–99)
Potassium: 5 mmol/L (ref 3.5–5.1)
Sodium: 140 mmol/L (ref 135–145)

## 2018-01-11 LAB — GLUCOSE, CAPILLARY: Glucose-Capillary: 108 mg/dL — ABNORMAL HIGH (ref 70–99)

## 2018-01-11 LAB — OCCULT BLOOD X 1 CARD TO LAB, STOOL: Fecal Occult Bld: NEGATIVE

## 2018-01-11 NOTE — Discharge Summary (Signed)
Physician Discharge Summary  Adrienne Handlerrene Kassem UJW:[id number] DOB: 04-Jun-1925 DOA: 01/07/2018  PCP: Laurann MontanaWhite, Cynthia, MD  Admit date: 01/07/2018 Discharge date: 01/11/2018  Admitted From: home Discharge disposition: home   Recommendations for Outpatient Follow-Up:   1. Home health 2. Encouraged small frequent meals 3. See below regarding insulin 4. BMP 1 week   Discharge Diagnosis:   Principal Problem:   Hypoglycemia Active Problems:   Diabetes mellitus without complication (HCC)   Hypertension   Diarrhea   AKI (acute kidney injury) (HCC)   Metabolic acidosis   Chronic renal insufficiency, stage 1    Discharge Condition: Improved.  Diet recommendation:   Regular.  Wound care: None.  Code status: Full.   History of Present Illness:    Adrienne Gonzalez is a 85 y.o. female with medical history significant of hypertension and diabetes insulin-dependent comes in with 10 days of nausea vomiting and diarrhea.  Her vomiting has resolved she still nauseous but her diarrhea has persisted.  She is having over 10 watery stools a day.  No fevers no abdominal pain.  Patient is normally on insulin but her family stopped giving her insulin over a day ago because she was not eating very well.  She waited in the emergency department waiting room very long last night her sugar dropped to the 20s.  Patient is being referred for admission for hypoglycemia and acute kidney injury secondary to dehydration from GI volume losses.   Hospital Course by Problem:     Acute kidney injury in setting of chronic renal insufficiency stage II related to dehydration in setting of acute gi illness resulting in volume losses.  -Baseline unclear -encourage PO intake  Hypoglycemia -secondary to poor p.o. intake and recent insulin usagein setting of acute kidney injury superimposed on chronic renal insufficiency.   HgA1c 6.4.  -continue PO and stop insulin for now-- family to bring in log of blood  sugars  Diarrhea/nausea -GI panel is negative.  -Nausea resolving but appetite varies. -  CT abdomen withDiverticulosis without diverticulitis.Fluid and fecal material throughout the colon consistent with the given clinical history of diarrhea.Lactic acid within limits of normal -diarrhea resolved  Diabetes mellitus without complication (HCC)-HgA1c 6.4 . -change diabetic medications  Hypertension -resume BP medications  Medical Consultants:      Discharge Exam:   Vitals:   01/10/18 2054 01/11/18 0546  BP: 133/66 126/64  Pulse: 75 72  Resp: 15 19  Temp: 98.7 F (37.1 C) 98.2 F (36.8 C)  SpO2: 99% 100%   Vitals:   01/10/18 0428 01/10/18 1516 01/10/18 2054 01/11/18 0546  BP: 125/64 121/68 133/66 126/64  Pulse: 67 70 75 72  Resp: 16 18 15 19   Temp: 97.6 F (36.4 C) 98.6 F (37 C) 98.7 F (37.1 C) 98.2 F (36.8 C)  TempSrc: Oral Oral Oral Oral  SpO2: 99% 100% 99% 100%  Weight:      Height:        General exam: Appears calm and comfortable.     The results of significant diagnostics from this hospitalization (including imaging, microbiology, ancillary and laboratory) are listed below for reference.     Procedures and Diagnostic Studies:   Ct Abdomen Pelvis Wo Contrast  Result Date: 01/08/2018 CLINICAL DATA:  Watery diarrhea for several days EXAM: CT ABDOMEN AND PELVIS WITHOUT CONTRAST TECHNIQUE: Multidetector CT imaging of the abdomen and pelvis was performed following the standard protocol without IV contrast. COMPARISON:  08/10/2007. FINDINGS: Lower chest: No acute abnormality. Hepatobiliary:  No focal liver abnormality is seen. Status post cholecystectomy. No biliary dilatation. Pancreas: Unremarkable. No pancreatic ductal dilatation or surrounding inflammatory changes. Spleen: Normal in size without focal abnormality. Adrenals/Urinary Tract: Adrenal glands are within normal limits. Kidneys demonstrate no renal calculi or obstructive change. Stable left  renal cyst is noted measuring 2.1 cm. Bladder is decompressed. Stomach/Bowel: Diverticular change of the colon is noted without evidence of diverticulitis. Fluid and fecal material is noted throughout the colon. The appendix is within normal limits. No small bowel obstructive changes are seen. Vascular/Lymphatic: Aortic atherosclerosis. No enlarged abdominal or pelvic lymph nodes. Reproductive: Uterus is not well visualized and has likely been surgically removed. No adnexal mass is noted. Other: No abdominal wall hernia or abnormality. No abdominopelvic ascites. Musculoskeletal: Degenerative changes of lumbar spine are seen. No acute bony abnormality is noted. IMPRESSION: Diverticulosis without diverticulitis. Fluid and fecal material throughout the colon consistent with the given clinical history of diarrhea. Chronic changes without acute abnormality. Electronically Signed   By: Alcide Clever M.D.   On: 01/08/2018 15:42     Labs:   Basic Metabolic Panel: Recent Labs  Lab 01/07/18 2003 01/09/18 0520 01/10/18 0612 01/11/18 0544  NA 136 135 138 140  K 4.1 4.5 4.5 5.0  CL 110 113* 116* 121*  CO2 14* 18* 17* 14*  GLUCOSE 57* 150* 147* 124*  BUN 23 15 14 11   CREATININE 1.65* 1.53* 1.59* 1.47*  CALCIUM 8.5* 8.0* 7.9* 8.2*   GFR Estimated Creatinine Clearance: 20.2 mL/min (A) (by C-G formula based on SCr of 1.47 mg/dL (H)). Liver Function Tests: Recent Labs  Lab 01/07/18 2003 01/10/18 0612  AST 29 19  ALT 38 21  ALKPHOS 37* 32*  BILITOT 0.6 0.9  PROT 6.0* 4.9*  ALBUMIN 3.3* 2.6*   Recent Labs  Lab 01/07/18 2003  LIPASE 12   No results for input(s): AMMONIA in the last 168 hours. Coagulation profile No results for input(s): INR, PROTIME in the last 168 hours.  CBC: Recent Labs  Lab 01/07/18 2003 01/09/18 0520 01/10/18 0612  WBC 6.4 5.6 5.1  HGB 11.6* 10.7* 10.2*  HCT 37.0 32.5* 32.4*  MCV 93.2 93.7 96.4  PLT 241 219 211   Cardiac Enzymes: No results for input(s):  CKTOTAL, CKMB, CKMBINDEX, TROPONINI in the last 168 hours. BNP: Invalid input(s): POCBNP CBG: Recent Labs  Lab 01/10/18 0235 01/10/18 1119 01/10/18 1649 01/10/18 2113 01/11/18 0709  GLUCAP 117* 140* 111* 106* 108*   D-Dimer No results for input(s): DDIMER in the last 72 hours. Hgb A1c Recent Labs    01/10/18 0612  HGBA1C 6.4*   Lipid Profile No results for input(s): CHOL, HDL, LDLCALC, TRIG, CHOLHDL, LDLDIRECT in the last 72 hours. Thyroid function studies No results for input(s): TSH, T4TOTAL, T3FREE, THYROIDAB in the last 72 hours.  Invalid input(s): FREET3 Anemia work up No results for input(s): VITAMINB12, FOLATE, FERRITIN, TIBC, IRON, RETICCTPCT in the last 72 hours. Microbiology Recent Results (from the past 240 hour(s))  Gastrointestinal Panel by PCR , Stool     Status: None   Collection Time: 01/08/18  2:53 PM  Result Value Ref Range Status   Campylobacter species NOT DETECTED NOT DETECTED Final   Plesimonas shigelloides NOT DETECTED NOT DETECTED Final   Salmonella species NOT DETECTED NOT DETECTED Final   Yersinia enterocolitica NOT DETECTED NOT DETECTED Final   Vibrio species NOT DETECTED NOT DETECTED Final   Vibrio cholerae NOT DETECTED NOT DETECTED Final   Enteroaggregative E coli (EAEC) NOT  DETECTED NOT DETECTED Final   Enteropathogenic E coli (EPEC) NOT DETECTED NOT DETECTED Final   Enterotoxigenic E coli (ETEC) NOT DETECTED NOT DETECTED Final   Shiga like toxin producing E coli (STEC) NOT DETECTED NOT DETECTED Final   Shigella/Enteroinvasive E coli (EIEC) NOT DETECTED NOT DETECTED Final   Cryptosporidium NOT DETECTED NOT DETECTED Final   Cyclospora cayetanensis NOT DETECTED NOT DETECTED Final   Entamoeba histolytica NOT DETECTED NOT DETECTED Final   Giardia lamblia NOT DETECTED NOT DETECTED Final   Adenovirus F40/41 NOT DETECTED NOT DETECTED Final   Astrovirus NOT DETECTED NOT DETECTED Final   Norovirus GI/GII NOT DETECTED NOT DETECTED Final    Rotavirus A NOT DETECTED NOT DETECTED Final   Sapovirus (I, II, IV, and V) NOT DETECTED NOT DETECTED Final    Comment: Performed at [facility], [address] [place], [place] [address]     Discharge Instructions:   Discharge Instructions    Diet Carb Modified   Complete by:  As directed    Discharge instructions   Complete by:  As directed    Hold insulin for now-- check blood sugars at home and bring log to PCP-- 2x day- once in AM before eating and once 2 hours after eating   Increase activity slowly   Complete by:  As directed      Allergies as of 01/11/2018      Reactions   Benzocaine Other (See Comments)   confusion   Metformin And Related Diarrhea   Morphine And Related Itching      Medication List    STOP taking these medications   HUMALOG MIX 75/25 KWIKPEN [place]     TAKE these medications   ALPHAGAN P 0.1 % Soln Generic drug:  brimonidine Place 1 drop into both eyes 3 (three) times daily.   aspirin EC 81 MG tablet Take 81 mg by mouth daily.   bimatoprost 0.01 % Soln Commonly known as:  LUMIGAN Place 1 drop into both eyes at bedtime.   cholecalciferol 25 MCG (1000 UT) tablet Commonly known as:  VITAMIN D3 Take 1,000 Units by mouth daily.   dorzolamide-timolol 22.3-6.8 MG/ML ophthalmic solution Commonly known as:  COSOPT Place 1 drop into both eyes 2 (two) times daily.   glimepiride 2 MG tablet Commonly known as:  AMARYL Take 2 mg by mouth daily.   olmesartan 40 MG tablet Commonly known as:  BENICAR Take 40 mg by mouth daily.   pravastatin 40 MG tablet Commonly known as:  PRAVACHOL Take 40 mg by mouth daily.   saxagliptin HCl 2.5 MG Tabs tablet Commonly known as:  ONGLYZA Take 2.5 mg by mouth daily.      Follow-up Information    follow up with PCP Follow up.        [facility] - Dme Follow up.   Why:  bedside commode Contact information: [address] [place] [address] [phone]        Health,  [facility]-Home Follow up.   Specialty:  Home Health Services Why:  Home Health Physical Therapy Contact information: [address] [place] [address] [phone]            Time coordinating discharge: 2 5 min  Signed:  Joseph ArtJessica U Lashauna Arpin DO  [facility] 01/11/2018, 4:11 PM

## 2018-01-11 NOTE — Care Management Note (Addendum)
Case Management Note  Patient Details  Name: Adrienne Gonzalez MRN: [id number] Date of Birth: 1925/06/11  Subjective/Objective:  85 yo female presented with diarrhea.                  Action/Plan: CM met with patient/son to discuss transitional needs. Patient reports living at home with her daughter and was independent PTA with no AD in use. PCP verified as: Dr. Dema Severin; pharmacy of choice: [facility]. PT eval complete with HHPT with [facility] recommended. CMS HH compare list provided with [facility] selected. [place] referral given to Adonis Brook, [facility] liaison; DME referral given to Jeneen Rinks, [place] liaison for [facility]; AVS updated. Patients son will provide transportation home. No further needs from CM.  Expected Discharge Date:  01/11/18               Expected Discharge Plan:  [place]  In-House Referral:  NA  Discharge planning Services  CM Consult  Post Acute Care Choice:  Home Health, Durable Medical Equipment Choice offered to:  Patient  DME Arranged:  Bedside commode DME Agency:  [place]:  PT [facility] Agency:  [place]  Status of Service:  Completed, signed off  If discussed at [place] of Stay Meetings, dates discussed:    Additional Comments:  Midge Minium RN, BSN, NCM-BC, ACM-RN [phone] 01/11/2018, 9:37 AM

## 2018-01-15 ENCOUNTER — Other Ambulatory Visit: Payer: Self-pay

## 2018-01-15 ENCOUNTER — Inpatient Hospital Stay (HOSPITAL_COMMUNITY)
Admission: EM | Admit: 2018-01-15 | Discharge: 2018-01-25 | DRG: 392 | Disposition: A | Discharge status: Home Health | Payer: Medicare Other | Attending: Internal Medicine | Admitting: Internal Medicine

## 2018-01-15 ENCOUNTER — Encounter (HOSPITAL_COMMUNITY): Payer: Self-pay

## 2018-01-15 DIAGNOSIS — E160 Drug-induced hypoglycemia without coma: Secondary | ICD-10-CM

## 2018-01-15 DIAGNOSIS — E876 Hypokalemia: Secondary | ICD-10-CM | POA: Diagnosis present

## 2018-01-15 DIAGNOSIS — E785 Hyperlipidemia, unspecified: Secondary | ICD-10-CM | POA: Diagnosis present

## 2018-01-15 DIAGNOSIS — N183 Chronic kidney disease, stage 3 unspecified: Secondary | ICD-10-CM

## 2018-01-15 DIAGNOSIS — E162 Hypoglycemia, unspecified: Secondary | ICD-10-CM

## 2018-01-15 DIAGNOSIS — E1122 Type 2 diabetes mellitus with diabetic chronic kidney disease: Secondary | ICD-10-CM | POA: Diagnosis present

## 2018-01-15 DIAGNOSIS — D638 Anemia in other chronic diseases classified elsewhere: Secondary | ICD-10-CM | POA: Diagnosis present

## 2018-01-15 DIAGNOSIS — E11649 Type 2 diabetes mellitus with hypoglycemia without coma: Secondary | ICD-10-CM | POA: Diagnosis present

## 2018-01-15 DIAGNOSIS — N179 Acute kidney failure, unspecified: Secondary | ICD-10-CM | POA: Diagnosis present

## 2018-01-15 DIAGNOSIS — T383X5A Adverse effect of insulin and oral hypoglycemic [antidiabetic] drugs, initial encounter: Secondary | ICD-10-CM

## 2018-01-15 DIAGNOSIS — R197 Diarrhea, unspecified: Secondary | ICD-10-CM | POA: Diagnosis not present

## 2018-01-15 DIAGNOSIS — Z9049 Acquired absence of other specified parts of digestive tract: Secondary | ICD-10-CM

## 2018-01-15 DIAGNOSIS — E1143 Type 2 diabetes mellitus with diabetic autonomic (poly)neuropathy: Secondary | ICD-10-CM | POA: Diagnosis present

## 2018-01-15 DIAGNOSIS — Z885 Allergy status to narcotic agent status: Secondary | ICD-10-CM

## 2018-01-15 DIAGNOSIS — Z8719 Personal history of other diseases of the digestive system: Secondary | ICD-10-CM

## 2018-01-15 DIAGNOSIS — K626 Ulcer of anus and rectum: Secondary | ICD-10-CM | POA: Diagnosis present

## 2018-01-15 DIAGNOSIS — R112 Nausea with vomiting, unspecified: Secondary | ICD-10-CM

## 2018-01-15 DIAGNOSIS — Z6825 Body mass index (BMI) 25.0-25.9, adult: Secondary | ICD-10-CM

## 2018-01-15 DIAGNOSIS — K5730 Diverticulosis of large intestine without perforation or abscess without bleeding: Secondary | ICD-10-CM | POA: Diagnosis not present

## 2018-01-15 DIAGNOSIS — B370 Candidal stomatitis: Secondary | ICD-10-CM | POA: Diagnosis not present

## 2018-01-15 DIAGNOSIS — Z7982 Long term (current) use of aspirin: Secondary | ICD-10-CM

## 2018-01-15 DIAGNOSIS — K648 Other hemorrhoids: Secondary | ICD-10-CM | POA: Diagnosis present

## 2018-01-15 DIAGNOSIS — I129 Hypertensive chronic kidney disease with stage 1 through stage 4 chronic kidney disease, or unspecified chronic kidney disease: Secondary | ICD-10-CM | POA: Diagnosis present

## 2018-01-15 DIAGNOSIS — R627 Adult failure to thrive: Secondary | ICD-10-CM

## 2018-01-15 DIAGNOSIS — Z888 Allergy status to other drugs, medicaments and biological substances status: Secondary | ICD-10-CM

## 2018-01-15 LAB — CBC WITH DIFFERENTIAL/PLATELET
Abs Immature Granulocytes: 0.01 10*3/uL (ref 0.00–0.07)
Basophils Absolute: 0 10*3/uL (ref 0.0–0.1)
Basophils Relative: 0 %
Eosinophils Absolute: 0.1 10*3/uL (ref 0.0–0.5)
Eosinophils Relative: 1 %
HCT: 38.6 % (ref 36.0–46.0)
Hemoglobin: 12.1 g/dL (ref 12.0–15.0)
Immature Granulocytes: 0 %
Lymphocytes Relative: 22 %
Lymphs Abs: 1.1 10*3/uL (ref 0.7–4.0)
MCH: 29.7 pg (ref 26.0–34.0)
MCHC: 31.3 g/dL (ref 30.0–36.0)
MCV: 94.8 fL (ref 80.0–100.0)
Monocytes Absolute: 0.6 10*3/uL (ref 0.1–1.0)
Monocytes Relative: 11 %
NRBC: 0 % (ref 0.0–0.2)
Neutro Abs: 3.4 10*3/uL (ref 1.7–7.7)
Neutrophils Relative %: 66 %
Platelets: 207 10*3/uL (ref 150–400)
RBC: 4.07 MIL/uL (ref 3.87–5.11)
RDW: 13.8 % (ref 11.5–15.5)
WBC: 5.1 10*3/uL (ref 4.0–10.5)

## 2018-01-15 LAB — LIPASE, BLOOD: Lipase: 16 U/L (ref 11–51)

## 2018-01-15 LAB — COMPREHENSIVE METABOLIC PANEL
ALK PHOS: 40 U/L (ref 38–126)
ALT: 24 U/L (ref 0–44)
ANION GAP: 6 (ref 5–15)
AST: 34 U/L (ref 15–41)
Albumin: 3.7 g/dL (ref 3.5–5.0)
BUN: 16 mg/dL (ref 8–23)
CO2: 21 mmol/L — ABNORMAL LOW (ref 22–32)
Calcium: 8.6 mg/dL — ABNORMAL LOW (ref 8.9–10.3)
Chloride: 113 mmol/L — ABNORMAL HIGH (ref 98–111)
Creatinine, Ser: 1.5 mg/dL — ABNORMAL HIGH (ref 0.44–1.00)
GFR calc Af Amer: 35 mL/min — ABNORMAL LOW (ref >60)
GFR calc non Af Amer: 30 mL/min — ABNORMAL LOW (ref >60)
GLUCOSE: 114 mg/dL — AB (ref 70–99)
Potassium: 3.8 mmol/L (ref 3.5–5.1)
Sodium: 140 mmol/L (ref 135–145)
Total Bilirubin: 0.8 mg/dL (ref 0.3–1.2)
Total Protein: 6.5 g/dL (ref 6.5–8.1)

## 2018-01-15 LAB — CBG MONITORING, ED
Glucose-Capillary: 24 mg/dL — CL (ref 70–99)
Glucose-Capillary: 36 mg/dL — CL (ref 70–99)
Glucose-Capillary: 114 mg/dL — ABNORMAL HIGH (ref 70–99)
Glucose-Capillary: 145 mg/dL — ABNORMAL HIGH (ref 70–99)

## 2018-01-15 LAB — MAGNESIUM: Magnesium: 2.1 mg/dL (ref 1.7–2.4)

## 2018-01-15 LAB — I-STAT TROPONIN, ED: Troponin i, poc: 0.01 ng/mL (ref 0.00–0.08)

## 2018-01-15 LAB — CORTISOL: Cortisol, Plasma: 11.3 ug/dL

## 2018-01-15 MED ORDER — DORZOLAMIDE HCL-TIMOLOL MAL 2-0.5 % OP SOLN
1.0000 [drp] | Freq: Two times a day (BID) | OPHTHALMIC | Status: DC
  Administered 2018-01-15 – 2018-01-25 (×19): 1 [drp] via OPHTHALMIC
  Filled 2018-01-15: qty 10

## 2018-01-15 MED ORDER — DEXTROSE-NACL 5-0.9 % IV SOLN
INTRAVENOUS | Status: DC
  Administered 2018-01-15 – 2018-01-17 (×3): via INTRAVENOUS

## 2018-01-15 MED ORDER — STERILE WATER FOR INJECTION IJ SOLN
INTRAMUSCULAR | Status: AC
  Filled 2018-01-15: qty 10

## 2018-01-15 MED ORDER — GLUCAGON HCL RDNA (DIAGNOSTIC) 1 MG IJ SOLR
1.0000 mg | Freq: Once | INTRAMUSCULAR | Status: AC
  Administered 2018-01-15: 1 mg via INTRAMUSCULAR
  Filled 2018-01-15: qty 1

## 2018-01-15 MED ORDER — LATANOPROST 0.005 % OP SOLN
1.0000 [drp] | Freq: Every day | OPHTHALMIC | Status: DC
  Administered 2018-01-15 – 2018-01-24 (×10): 1 [drp] via OPHTHALMIC
  Filled 2018-01-15: qty 2.5

## 2018-01-15 MED ORDER — PRAVASTATIN SODIUM 40 MG PO TABS
40.0000 mg | ORAL_TABLET | Freq: Every day | ORAL | Status: DC
  Administered 2018-01-15 – 2018-01-25 (×11): 40 mg via ORAL
  Filled 2018-01-15 (×2): qty 1
  Filled 2018-01-15 (×2): qty 2
  Filled 2018-01-15: qty 1
  Filled 2018-01-15: qty 2
  Filled 2018-01-15: qty 1
  Filled 2018-01-15: qty 2
  Filled 2018-01-15: qty 1
  Filled 2018-01-15: qty 2
  Filled 2018-01-15: qty 1

## 2018-01-15 MED ORDER — SODIUM CHLORIDE 0.9 % IV BOLUS
500.0000 mL | Freq: Once | INTRAVENOUS | Status: AC
  Administered 2018-01-15: 500 mL via INTRAVENOUS

## 2018-01-15 MED ORDER — ASPIRIN EC 81 MG PO TBEC
81.0000 mg | DELAYED_RELEASE_TABLET | Freq: Every day | ORAL | Status: DC
  Administered 2018-01-15 – 2018-01-25 (×11): 81 mg via ORAL
  Filled 2018-01-15 (×11): qty 1

## 2018-01-15 MED ORDER — IRBESARTAN 75 MG PO TABS
37.5000 mg | ORAL_TABLET | Freq: Every day | ORAL | Status: DC
  Administered 2018-01-16 – 2018-01-19 (×4): 37.5 mg via ORAL
  Filled 2018-01-15 (×4): qty 1

## 2018-01-15 MED ORDER — VITAMIN D 25 MCG (1000 UNIT) PO TABS
1000.0000 [IU] | ORAL_TABLET | Freq: Every day | ORAL | Status: DC
  Administered 2018-01-15 – 2018-01-25 (×11): 1000 [IU] via ORAL
  Filled 2018-01-15 (×11): qty 1

## 2018-01-15 MED ORDER — BRIMONIDINE TARTRATE 0.15 % OP SOLN
1.0000 [drp] | Freq: Three times a day (TID) | OPHTHALMIC | Status: DC
  Administered 2018-01-15 – 2018-01-25 (×28): 1 [drp] via OPHTHALMIC
  Filled 2018-01-15: qty 5

## 2018-01-15 MED ORDER — ENOXAPARIN SODIUM 30 MG/0.3ML ~~LOC~~ SOLN
30.0000 mg | SUBCUTANEOUS | Status: DC
  Administered 2018-01-15 – 2018-01-24 (×10): 30 mg via SUBCUTANEOUS
  Filled 2018-01-15 (×10): qty 0.3

## 2018-01-15 MED ORDER — SACCHAROMYCES BOULARDII 250 MG PO CAPS
500.0000 mg | ORAL_CAPSULE | Freq: Two times a day (BID) | ORAL | Status: DC
  Administered 2018-01-15 – 2018-01-25 (×19): 500 mg via ORAL
  Filled 2018-01-15 (×19): qty 2

## 2018-01-15 NOTE — ED Notes (Signed)
Tired to obtain stool sample. Pt urinated in both samples will continue to try to get sample.

## 2018-01-15 NOTE — ED Provider Notes (Signed)
Patient has had multiple episodes of diarrhea since end of December 2019.  Her daughter who accompanies her reports she has had 10-20 episodes per day for the past 2 days.  She is also had diminished appetite.  Her last dose of glimepride.  She denies pain anywhere.  Denies other associated symptoms.  She is presently alert answers questions appropriately.     Doug Sou, MD 01/15/18 [phone]

## 2018-01-15 NOTE — Progress Notes (Signed)
Pt has arrived from the ED with family at the bedside, explanation of Enteric precautions explained, pt oriented to the room , call bell in bed wth pt. Granddtr to spend the night.

## 2018-01-15 NOTE — ED Notes (Signed)
Pt has a small wound in her sacral area. Family advised to help monitor wound from getting worse. Moisture cream applied.

## 2018-01-15 NOTE — ED Triage Notes (Signed)
Pt d/c from [facility] on Monday . Since then, pt has continued to have diarrhea. Dr's office concerned for dehydration. Pt states 10-20 episodes of diarrhea in last 24 hours.

## 2018-01-15 NOTE — ED Provider Notes (Signed)
[facility]-EMERGENCY DEPT Provider Note   CSN: [id number] Arrival date & time: 01/15/18  1339     History   Chief Complaint Chief Complaint  Patient presents with  . Diarrhea  . Hypoglycemia    HPI Adrienne Gonzalez is a 85 y.o. female.  HPI  Patient is a 85 year old female with a history of renal insufficiency, type 2 diabetes mellitus, hypertension presenting for persistent diarrhea and hyperglycemia.  Patient presents with her two children who are her primary caregivers with whom she lives.  Patient reports that she has had diarrhea 10-20 times a day that is watery.  No melena or hematochezia.  [facility] days ago after 4-day admission for diarrhea and dehydration.  Last episode of vomiting was 2 days ago, nonbilious and nonbloody.  Patient has had poor p.o. intake, but has not had persistent vomiting.  Patient denies any abdominal pain, chest pain, shortness of breath.  Patient has been slowly discontinuing all of her anti-hyperglycemics due to her recent illnesses.  When patient was discharged from the hospital 2 days ago her insulin was discontinued.  Her last doses of sitagliptin and glimepiride were last night.  Patient saw her primary care provider today who discontinued those medications.   Past Medical History:  Diagnosis Date  . Chronic renal insufficiency, stage 1   . Diabetes mellitus without complication (HCC)   . Hypertension     Patient Active Problem List   Diagnosis Date Noted  . Chronic renal insufficiency, stage 1 01/10/2018  . Metabolic acidosis 01/09/2018  . Hypoglycemia 01/08/2018  . Diarrhea 01/08/2018  . AKI (acute kidney injury) (HCC) 01/08/2018  . Diabetes mellitus without complication (HCC)   . Hypertension     History reviewed. No pertinent surgical history.   OB History   No obstetric history on file.      Home Medications    Prior to Admission medications   Medication Sig Start  Date End Date Taking? Authorizing Provider  aspirin EC 81 MG tablet Take 81 mg by mouth daily.   Yes [provider]  bimatoprost (LUMIGAN) 0.01 % SOLN Place 1 drop into both eyes at bedtime.   Yes [provider]  brimonidine (ALPHAGAN P) 0.1 % SOLN Place 1 drop into both eyes 3 (three) times daily.   Yes [provider]  cholecalciferol (VITAMIN D3) 25 MCG (1000 UT) tablet Take 1,000 Units by mouth daily.   Yes [provider]  dorzolamide-timolol (COSOPT) 22.3-6.8 MG/ML ophthalmic solution Place 1 drop into both eyes 2 (two) times daily.   Yes [provider]  olmesartan (BENICAR) 40 MG tablet Take 40 mg by mouth daily.   Yes [provider]  pravastatin (PRAVACHOL) 40 MG tablet Take 40 mg by mouth daily.   Yes [provider]    Family History No family history on file.  Social History Social History   Tobacco Use  . Smoking status: Never Smoker  . Smokeless tobacco: Never Used  Substance Use Topics  . Alcohol use: Never    Frequency: Never  . Drug use: Never     Allergies   Benzocaine; Metformin and related; and Morphine and related   Review of Systems Review of Systems  Constitutional: Negative for chills and fever.  HENT: Negative for congestion and sore throat.   Eyes: Negative for visual disturbance.  Respiratory: Negative for cough, chest tightness and shortness of breath.   Cardiovascular: Negative for chest pain, palpitations  and leg swelling.  Gastrointestinal: Positive for diarrhea, nausea and vomiting. Negative for abdominal pain and blood in stool.  Genitourinary: Negative for dysuria and flank pain.  Musculoskeletal: Negative for back pain and myalgias.  Skin: Negative for rash.  Neurological: Positive for weakness. Negative for dizziness, syncope and light-headedness.       +Generalized weakness.      Physical Exam Updated Vital Signs BP (!) 156/72   Pulse 94   Temp 98.5 F (36.9 C)  (Oral)   Resp 17   Ht 5' (1.524 m)   Wt 59 kg   SpO2 100%   BMI 25.40 kg/m   Physical Exam Vitals signs and nursing note reviewed.  Constitutional:      General: She is not in acute distress.    Appearance: She is well-developed.     Comments: Alert, responding appropriately to questions but slightly sleepy on exam.   HENT:     Head: Normocephalic and atraumatic.     Mouth/Throat:     Mouth: Mucous membranes are dry.  Eyes:     Conjunctiva/sclera: Conjunctivae normal.     Pupils: Pupils are equal, round, and reactive to light.  Neck:     Musculoskeletal: Normal range of motion and neck supple.  Cardiovascular:     Rate and Rhythm: Normal rate and regular rhythm.     Pulses: Normal pulses.     Heart sounds: Normal heart sounds, S1 normal and S2 normal. No murmur.     Comments: No lower extremity edema.  No calf tenderness. Pulmonary:     Effort: Pulmonary effort is normal.     Breath sounds: Normal breath sounds. No wheezing or rales.  Abdominal:     General: There is no distension.     Palpations: Abdomen is soft.     Tenderness: There is no abdominal tenderness. There is no guarding.  Musculoskeletal: Normal range of motion.        General: No deformity.  Lymphadenopathy:     Cervical: No cervical adenopathy.  Skin:    General: Skin is warm and dry.     Findings: No erythema or rash.  Neurological:     Mental Status: She is alert.     Comments: Cranial nerves grossly intact. Patient moves extremities symmetrically and with good coordination.  Psychiatric:        Behavior: Behavior normal.        Thought Content: Thought content normal.        Judgment: Judgment normal.      ED Treatments / Results  Labs (all labs ordered are listed, but only abnormal results are displayed) Labs Reviewed  COMPREHENSIVE METABOLIC PANEL - Abnormal; Notable for the following components:      Result Value   Chloride 113 (*)    CO2 21 (*)    Glucose, Bld 114 (*)     Creatinine, Ser 1.50 (*)    Calcium 8.6 (*)    GFR calc non Af Amer 30 (*)    GFR calc Af Amer 35 (*)    All other components within normal limits  CBG MONITORING, ED - Abnormal; Notable for the following components:   Glucose-Capillary 36 (*)    All other components within normal limits  CBG MONITORING, ED - Abnormal; Notable for the following components:   Glucose-Capillary 24 (*)    All other components within normal limits  CBG MONITORING, ED - Abnormal; Notable for the following components:   Glucose-Capillary 114 (*)  All other components within normal limits  C DIFFICILE QUICK SCREEN W PCR REFLEX  LIPASE, BLOOD  CBC WITH DIFFERENTIAL/PLATELET  URINALYSIS, ROUTINE W REFLEX MICROSCOPIC  CORTISOL  I-STAT TROPONIN, ED    EKG None  Radiology No results found.  Procedures Procedures (including critical care time)  CRITICAL CARE Performed by: Elisha Ponder   Total critical care time: 35 minutes  Critical care time was exclusive of separately billable procedures and treating other patients.  Critical care was necessary to treat or prevent imminent or life-threatening deterioration.  Critical care was time spent personally by me on the following activities: development of treatment plan with patient and/or surrogate as well as nursing, discussions with consultants, evaluation of patient's response to treatment, examination of patient, obtaining history from patient or surrogate, ordering and performing treatments and interventions, ordering and review of laboratory studies, ordering and review of radiographic studies, pulse oximetry and re-evaluation of patient's condition.  Medications Ordered in ED Medications  dextrose 5 %-0.9 % sodium chloride infusion ( Intravenous New Bag/Given (Non-Interop) 01/15/18 1548)  sterile water (preservative free) injection (  Not Given 01/15/18 1552)  sodium chloride 0.9 % bolus 500 mL (500 mLs Intravenous New Bag/Given (Non-Interop)  01/15/18 1552)  glucagon (human recombinant) (GLUCAGEN) injection 1 mg (1 mg Intramuscular Given 01/15/18 1502)     Initial Impression / Assessment and Plan / ED Course  I have reviewed the triage vital signs and the nursing notes.  Pertinent labs & imaging results that were available during my care of the patient were reviewed by me and considered in my medical decision making (see chart for details).  Clinical Course as of Jan 16 1727  Fri Jan 15, 2018  1500 Patient evaluated immediately at bedside. Mentating well. Likely iatrogenic. Will order cortisol but do not feel patient needs hydrocortisone.   Glucose-Capillary(!!): 36 [AM]  1705 Overall stable.   Creatinine(!): 1.50 [AM]  1728 Spoke with hopsitalist Dr. Jerolyn Center who will admit patient. Appreciate her involvement.    [AM]    Clinical Course User Index [AM] Elisha Ponder, PA-C    Patient critically ill secondary to hypoglycemia, likely due to poor oral intake.  Patient is afebrile and hemodynamically stable.  Case was discussed with ED pharmacist, Trula Ore, who states that half-life of patient sulfonylurea and GLP-1 inhibitor have half-lives of 5-9 and 5 to 6 hours respectively.  Would expect that patient would have clearance mostly by 24 hours, however given patient's age and renal function, likely will persist in her system for greater than 24 hours.  Will place patient on a glucose drip.  Regarding patient's diarrhea, patient had a negative GI panel, however C. difficile was not collected.  Will order.  Patient's renal function is at baseline.  Magnesium is normal.  No leukocytosis.  Troponin negative.  EKG without evidence of acute ischemia, infarction, or arrhythmia.  This is a shared visit with Dr. Doug Sou. Patient was independently evaluated by this attending physician. Attending physician consulted in evaluation and admission management.  Final Clinical Impressions(s) / ED Diagnoses   Final diagnoses:    Hypoglycemia  Diarrhea, unspecified type    ED Discharge Orders    None       Delia Chimes 01/15/18 1731    Doug Sou, MD 01/16/18 1600

## 2018-01-15 NOTE — ED Notes (Signed)
States was recenlty hospitalized for diarrhea-states still having loose stools-states possible dehydration

## 2018-01-15 NOTE — ED Notes (Signed)
ED TO INPATIENT HANDOFF REPORT  Name/Age/Gender Adrienne Gonzalez 85 y.o. female  Code Status    Code Status Orders  (From admission, onward)         Start     Ordered   01/15/18 1750  Full code  Continuous     01/15/18 1750        Code Status History    Date Active Date Inactive Code Status Order ID Comments User Context   01/08/2018 1157 01/11/2018 1417 Full Code [id number]  Haydee Monicaavid, Rachal A, MD ED    Advance Directive Documentation     Most Recent Value  Type of Advance Directive  Healthcare Power of Attorney  Pre-existing out of facility DNR order (yellow form or pink MOST form)  -  "MOST" Form in Place?  -      Home/SNF/Other Home  Chief Complaint diarrhea  Level of Care/Admitting Diagnosis ED Disposition    ED Disposition Condition Comment   Admit  [facility] [[id number]]  Level of Care: Med-Surg [16]  Diagnosis: Hypoglycemia [409811][242204]  Admitting Physician: Alwyn RenMATHEWS, ELIZABETH G [[id number]]  Attending Physician: Alwyn RenMATHEWS, ELIZABETH G [[id number]]  PT Class (Do Not Modify): Observation [104]  PT Acc Code (Do Not Modify): Observation [10022]       Medical History Past Medical History:  Diagnosis Date  . Chronic renal insufficiency, stage 1   . Diabetes mellitus without complication (HCC)   . Hypertension     Allergies Allergies  Allergen Reactions  . Benzocaine Other (See Comments)    confusion  . Metformin And Related Diarrhea  . Morphine And Related Itching    IV Location/Drains/Wounds Patient Lines/Drains/Airways Status   Active Line/Drains/Airways    Name:   Placement date:   Placement time:   Site:   Days:   Peripheral IV 01/15/18 Left Antecubital   01/15/18    1536    Antecubital   less than 1   Wound / Incision (Open or Dehisced) 01/15/18 Other (Comment) Coccyx STAGE 1   01/15/18    1936    Coccyx   less than 1          Labs/Imaging Results for orders placed or performed during the hospital encounter of 01/15/18  (from the past 48 hour(s))  CBG monitoring, ED     Status: Abnormal   Collection Time: 01/15/18  2:34 PM  Result Value Ref Range   Glucose-Capillary 24 (LL) 70 - 99 mg/dL  CBG monitoring, ED     Status: Abnormal   Collection Time: 01/15/18  2:55 PM  Result Value Ref Range   Glucose-Capillary 36 (LL) 70 - 99 mg/dL   Comment 1 Notify RN   Lipase, blood     Status: None   Collection Time: 01/15/18  3:41 PM  Result Value Ref Range   Lipase 16 11 - 51 U/L    Comment: Performed at [facility], [address] [place], [place] [address]  Comprehensive metabolic panel     Status: Abnormal   Collection Time: 01/15/18  3:41 PM  Result Value Ref Range   Sodium 140 135 - 145 mmol/L   Potassium 3.8 3.5 - 5.1 mmol/L   Chloride 113 (H) 98 - 111 mmol/L   CO2 21 (L) 22 - 32 mmol/L   Glucose, Bld 114 (H) 70 - 99 mg/dL   BUN 16 8 - 23 mg/dL   Creatinine, Ser 6.961.50 (H) 0.44 - 1.00 mg/dL   Calcium 8.6 (L) 8.9 -  10.3 mg/dL   Total Protein 6.5 6.5 - 8.1 g/dL   Albumin 3.7 3.5 - 5.0 g/dL   AST 34 15 - 41 U/L   ALT 24 0 - 44 U/L   Alkaline Phosphatase 40 38 - 126 U/L   Total Bilirubin 0.8 0.3 - 1.2 mg/dL   GFR calc non Af Amer 30 (L) >60 mL/min   GFR calc Af Amer 35 (L) >60 mL/min   Anion gap 6 5 - 15    Comment: Performed at [facility], [address] [place], [place] [address]  Magnesium     Status: None   Collection Time: 01/15/18  3:41 PM  Result Value Ref Range   Magnesium 2.1 1.7 - 2.4 mg/dL    Comment: Performed at [facility], [address] [place], [place] [address]  Cortisol     Status: None   Collection Time: 01/15/18  3:42 PM  Result Value Ref Range   Cortisol, Plasma 11.3 ug/dL    Comment: (NOTE) AM    6.7 - 22.6 ug/dL PM   <09.8<10.0       ug/dL Performed at [facility], [address] [place], [place] [address]   I-Stat Troponin, ED (not at [facility])     Status: None   Collection Time: 01/15/18  3:42 PM  Result  Value Ref Range   Troponin i, poc 0.01 0.00 - 0.08 ng/mL   Comment 3            Comment: Due to the release kinetics of cTnI, a negative result within the first hours of the onset of symptoms does not rule out myocardial infarction with certainty. If myocardial infarction is still suspected, repeat the test at appropriate intervals.   CBC with Differential     Status: None   Collection Time: 01/15/18  3:43 PM  Result Value Ref Range   WBC 5.1 4.0 - 10.5 K/uL   RBC 4.07 3.87 - 5.11 MIL/uL   Hemoglobin 12.1 12.0 - 15.0 g/dL   HCT 78.238.6 95.636.0 - 21.346.0 %   MCV 94.8 80.0 - 100.0 fL   MCH 29.7 26.0 - 34.0 pg   MCHC 31.3 30.0 - 36.0 g/dL   RDW 08.613.8 57.811.5 - 46.915.5 %   Platelets 207 150 - 400 K/uL   nRBC 0.0 0.0 - 0.2 %   Neutrophils Relative % 66 %   Neutro Abs 3.4 1.7 - 7.7 K/uL   Lymphocytes Relative 22 %   Lymphs Abs 1.1 0.7 - 4.0 K/uL   Monocytes Relative 11 %   Monocytes Absolute 0.6 0.1 - 1.0 K/uL   Eosinophils Relative 1 %   Eosinophils Absolute 0.1 0.0 - 0.5 K/uL   Basophils Relative 0 %   Basophils Absolute 0.0 0.0 - 0.1 K/uL   Immature Granulocytes 0 %   Abs Immature Granulocytes 0.01 0.00 - 0.07 K/uL    Comment: Performed at [facility], [address] [place], [place] [address]  POC CBG, ED     Status: Abnormal   Collection Time: 01/15/18  4:04 PM  Result Value Ref Range   Glucose-Capillary 114 (H) 70 - 99 mg/dL  CBG monitoring, ED     Status: Abnormal   Collection Time: 01/15/18  7:47 PM  Result Value Ref Range   Glucose-Capillary 145 (H) 70 - 99 mg/dL   No results found.  Pending Labs [employer] Labs (From admission, onward)    Start  Ordered   01/16/18 0500  Comprehensive metabolic panel  Tomorrow morning,   R     01/15/18 1750   01/16/18 0500  CBC  Tomorrow morning,   R     01/15/18 1750   01/15/18 1444  C difficile quick scan w PCR reflex  (C Difficile quick screen w PCR reflex panel)  Once, for 24 hours,   R     01/15/18 1443    01/15/18 1348  Urinalysis, Routine w reflex microscopic  ONCE - STAT,   STAT     01/15/18 1347          Vitals/Pain Today's Vitals   01/15/18 1651 01/15/18 1700 01/15/18 1935 01/15/18 1942  BP:  139/83  (!) 146/79  Pulse:  93  94  Resp:  16  19  Temp:      TempSrc:      SpO2:  100%  100%  Weight:      Height:      PainSc: 0-No pain  0-No pain     Isolation Precautions Enteric precautions (UV disinfection)  Medications Medications  dextrose 5 %-0.9 % sodium chloride infusion ( Intravenous New Bag/Given (Non-Interop) 01/15/18 1548)  sterile water (preservative free) injection (  Not Given 01/15/18 1552)  enoxaparin (LOVENOX) injection 30 mg (has no administration in time range)  aspirin EC tablet 81 mg (has no administration in time range)  latanoprost (XALATAN) 0.005 % ophthalmic solution 1 drop (has no administration in time range)  brimonidine (ALPHAGAN) 0.15 % ophthalmic solution 1 drop (has no administration in time range)  cholecalciferol (VITAMIN D3) tablet 1,000 Units (has no administration in time range)  dorzolamide-timolol (COSOPT) 22.3-6.8 MG/ML ophthalmic solution 1 drop (has no administration in time range)  irbesartan (AVAPRO) tablet 37.5 mg (has no administration in time range)  pravastatin (PRAVACHOL) tablet 40 mg (has no administration in time range)  saccharomyces boulardii (FLORASTOR) capsule 500 mg (has no administration in time range)  glucagon (human recombinant) (GLUCAGEN) injection 1 mg (1 mg Intramuscular Given 01/15/18 1502)  sodium chloride 0.9 % bolus 500 mL (0 mLs Intravenous Stopped 01/15/18 1653)    Mobility walks with device

## 2018-01-15 NOTE — H&P (Signed)
History and Physical    Adrienne Handlerrene Brancato ZOX:[id number] DOB: 11-Jan-1925 DOA: 01/15/2018  PCP: Laurann MontanaWhite, Cynthia, MD Patient coming from: Home patient lives at home with the daughter  Chief Complaint: Low blood sugar and diarrhea  HPI: Adrienne Gonzalez is a 85 y.o. female with medical history significant of type 2 diabetes, hypertension admitted with nausea vomiting and diarrhea.  She was [facility] to [facility]07[facility] and discharged 2 days ago for the same complaints.  She has had no fever complains of some abdominal cramps.  She does not see any blood in her stool.  She states it is very foul-smelling.  GI panel has been negative.  A C. difficile PCR is sent from the ER today.  Her diarrhea had resolved at the time of discharge from the hospital 2 days ago.  She had a CT of the abdomen that showed diverticulosis without diverticulitis and fluid and fecal material throughout the colon.  She denies any antibiotic intake recently.  She was discharged home on saxagliptin and glimepiride 2 mg daily And insulin was stopped.  Appetite is poor her p.o. intake is diminished.  She continued to have low blood sugar.  Her blood sugar in the ER was 24 she was given glucagon and orange juice and was started on a D5 drip.  She denies any chest pain shortness of breath cough headaches changes with her vision falls urinary complaints.  ED Course: Patient was started on D5 normal saline.  Vital signs 150/72 pulse 94 respiration 17 saturation 100% on room air she is afebrile.  Labs significant for 03/07/1938 potassium 3.8 BUN 16 creatinine 1.50 which is about her baseline.  At the time of discharge her creatinine was 1.47.  Magnesium 2.1 albumin 3.7 liver function test normal white count 5.1 hemoglobin 12.1 platelets 207 hemoglobin A1c 6.4  Review of Systems: As per HPI otherwise all other systems reviewed and are negative  Ambulatory Status: Ambulatory at baseline  Past Medical History:  Diagnosis Date  . Chronic renal insufficiency,  stage 1   . Diabetes mellitus without complication (HCC)   . Hypertension     History reviewed. No pertinent surgical history.  Social History   Socioeconomic History  . Marital status: Widowed    Spouse name: Not on file  . Number of children: Not on file  . Years of education: Not on file  . Highest education level: Not on file  Occupational History  . Not on file  Social Needs  . Financial resource strain: Not on file  . Food insecurity:    Worry: Never true    Inability: Never true  . Transportation needs:    Medical: No    Non-medical: No  Tobacco Use  . Smoking status: Never Smoker  . Smokeless tobacco: Never Used  Substance and Sexual Activity  . Alcohol use: Never    Frequency: Never  . Drug use: Never  . Sexual activity: Not on file  Lifestyle  . Physical activity:    Days per week: Patient refused    Minutes per session: Patient refused  . Stress: Not at all  Relationships  . Social connections:    Talks on phone: Patient refused    Gets together: Patient refused    Attends religious service: Patient refused    Active member of club or organization: Patient refused    Attends meetings of clubs or organizations: Patient refused    Relationship status: Patient refused  . Intimate partner violence:    Fear of  current or ex partner: Patient refused    Emotionally abused: Patient refused    Physically abused: Patient refused    Forced sexual activity: Patient refused  Other Topics Concern  . Not on file  Social History Narrative  . Not on file    Allergies  Allergen Reactions  . Benzocaine Other (See Comments)    confusion  . Metformin And Related Diarrhea  . Morphine And Related Itching    No family history on file.   Prior to Admission medications   Medication Sig Start Date End Date Taking? Authorizing Provider  aspirin EC 81 MG tablet Take 81 mg by mouth daily.   Yes [provider]  bimatoprost (LUMIGAN) 0.01 % SOLN Place 1 drop  into both eyes at bedtime.   Yes [provider]  brimonidine (ALPHAGAN P) 0.1 % SOLN Place 1 drop into both eyes 3 (three) times daily.   Yes [provider]  cholecalciferol (VITAMIN D3) 25 MCG (1000 UT) tablet Take 1,000 Units by mouth daily.   Yes [provider]  dorzolamide-timolol (COSOPT) 22.3-6.8 MG/ML ophthalmic solution Place 1 drop into both eyes 2 (two) times daily.   Yes [provider]  olmesartan (BENICAR) 40 MG tablet Take 40 mg by mouth daily.   Yes [provider]  pravastatin (PRAVACHOL) 40 MG tablet Take 40 mg by mouth daily.   Yes [provider]    Physical Exam: Reports feeling cold otherwise no complaints Vitals:   01/15/18 1346 01/15/18 1348 01/15/18 1630  BP:  130/76 (!) 156/72  Pulse:  83 94  Resp:  16 17  Temp:  98.5 F (36.9 C)   TempSrc:  Oral   SpO2:  100% 100%  Weight: 59 kg    Height: 5' (1.524 m)       . General:  Appears calm and comfortable . Eyes:  PERRL, EOMI, normal lids, iris . ENT: grossly normal hearing, lips & tongue, oral mucosa dry  . neck:  no LAD, masses or thyromegaly . Cardiovascular:  RRR, no m/r/g. No LE edema.  Marland Kitchen. Respiratory: CTA bilaterally, no w/r/r. Normal respiratory effort. . Abdomen: soft, ntnd, NABS . Skin:  no rash or induration seen on limited exam . Musculoskeletal:  grossly normal tone BUE/BLE, good ROM, no bony abnormality . Psychiatric:  grossly normal mood and affect, speech fluent and appropriate, AOx3 . Neurologic: CN 2-12 grossly intact, moves all extremities in coordinated fashion, sensation intact  Labs on Admission: I have personally reviewed following labs and imaging studies  CBC: Recent Labs  Lab 01/09/18 0520 01/10/18 0612 01/15/18 1543  WBC 5.6 5.1 5.1  NEUTROABS  --   --  3.4  HGB 10.7* 10.2* 12.1  HCT 32.5* 32.4* 38.6  MCV 93.7 96.4 94.8  PLT 219 211 207   Basic Metabolic Panel: Recent Labs  Lab 01/09/18 0520 01/10/18 0612  01/11/18 0544 01/15/18 1541  NA 135 138 140 140  K 4.5 4.5 5.0 3.8  CL 113* 116* 121* 113*  CO2 18* 17* 14* 21*  GLUCOSE 150* 147* 124* 114*  BUN 15 14 11 16   CREATININE 1.53* 1.59* 1.47* 1.50*  CALCIUM 8.0* 7.9* 8.2* 8.6*  MG  --   --   --  2.1   GFR: Estimated Creatinine Clearance: 20 mL/min (A) (by C-G formula based on SCr of 1.5 mg/dL (H)). Liver Function Tests: Recent Labs  Lab 01/10/18 0612 01/15/18 1541  AST 19 34  ALT 21 24  ALKPHOS 32*  40  BILITOT 0.9 0.8  PROT 4.9* 6.5  ALBUMIN 2.6* 3.7   Recent Labs  Lab 01/15/18 1541  LIPASE 16   No results for input(s): AMMONIA in the last 168 hours. Coagulation Profile: No results for input(s): INR, PROTIME in the last 168 hours. Cardiac Enzymes: No results for input(s): CKTOTAL, CKMB, CKMBINDEX, TROPONINI in the last 168 hours. BNP (last 3 results) No results for input(s): PROBNP in the last 8760 hours. HbA1C: No results for input(s): HGBA1C in the last 72 hours. CBG: Recent Labs  Lab 01/10/18 2113 01/11/18 0709 01/15/18 1434 01/15/18 1455 01/15/18 1604  GLUCAP 106* 108* 24* 36* 114*   Lipid Profile: No results for input(s): CHOL, HDL, LDLCALC, TRIG, CHOLHDL, LDLDIRECT in the last 72 hours. Thyroid Function Tests: No results for input(s): TSH, T4TOTAL, FREET4, T3FREE, THYROIDAB in the last 72 hours. Anemia Panel: No results for input(s): VITAMINB12, FOLATE, FERRITIN, TIBC, IRON, RETICCTPCT in the last 72 hours. Urine analysis:    Component Value Date/Time   COLORURINE YELLOW 01/07/2018 1856   APPEARANCEUR HAZY (A) 01/07/2018 1856   LABSPEC 1.017 01/07/2018 1856   PHURINE 5.0 01/07/2018 1856   GLUCOSEU NEGATIVE 01/07/2018 1856   HGBUR NEGATIVE 01/07/2018 1856   BILIRUBINUR NEGATIVE 01/07/2018 1856   KETONESUR 5 (A) 01/07/2018 1856   PROTEINUR NEGATIVE 01/07/2018 1856   UROBILINOGEN 0.2 06/28/2007 2238   NITRITE NEGATIVE 01/07/2018 1856   LEUKOCYTESUR NEGATIVE 01/07/2018 1856    Creatinine  Clearance: Estimated Creatinine Clearance: 20 mL/min (A) (by C-G formula based on SCr of 1.5 mg/dL (H)).  Sepsis Labs: @LABRCNTIP (procalcitonin:4,lacticidven:4) ) Recent Results (from the past 240 hour(s))  Gastrointestinal Panel by PCR , Stool     Status: None   Collection Time: 01/08/18  2:53 PM  Result Value Ref Range Status   Campylobacter species NOT DETECTED NOT DETECTED Final   Plesimonas shigelloides NOT DETECTED NOT DETECTED Final   Salmonella species NOT DETECTED NOT DETECTED Final   Yersinia enterocolitica NOT DETECTED NOT DETECTED Final   Vibrio species NOT DETECTED NOT DETECTED Final   Vibrio cholerae NOT DETECTED NOT DETECTED Final   Enteroaggregative E coli (EAEC) NOT DETECTED NOT DETECTED Final   Enteropathogenic E coli (EPEC) NOT DETECTED NOT DETECTED Final   Enterotoxigenic E coli (ETEC) NOT DETECTED NOT DETECTED Final   Shiga like toxin producing E coli (STEC) NOT DETECTED NOT DETECTED Final   Shigella/Enteroinvasive E coli (EIEC) NOT DETECTED NOT DETECTED Final   Cryptosporidium NOT DETECTED NOT DETECTED Final   Cyclospora cayetanensis NOT DETECTED NOT DETECTED Final   Entamoeba histolytica NOT DETECTED NOT DETECTED Final   Giardia lamblia NOT DETECTED NOT DETECTED Final   Adenovirus F40/41 NOT DETECTED NOT DETECTED Final   Astrovirus NOT DETECTED NOT DETECTED Final   Norovirus GI/GII NOT DETECTED NOT DETECTED Final   Rotavirus A NOT DETECTED NOT DETECTED Final   Sapovirus (I, II, IV, and V) NOT DETECTED NOT DETECTED Final    Comment: Performed at [facility], [address] [place], [place] [address]     Radiological Exams on Admission: No results found.   Assessment/Plan Active Problems:   * No active hospital problems. *    #1 type 2 diabetes hypoglycemia in the setting of decreased p.o. intake and oral hypoglycemics saxagliptin and glimepiride.  With the long duration of action will start and continue the patient on D5 normal saline.  Her  blood sugar on arrival to ER was 24.  Her main complaint was low sugar and diarrhea.  Her hemoglobin A1c was 6.4 recently.  She probably will not need any hypoglycemic agents at the time of discharge.  Her appetite is poor p.o. intake is poor and she is been having diarrhea.  #2 diarrhea she reports having diarrhea since Christmas and then it was gone then now it is come back again during admission to con beginning of this month 01/07/2018 she had diarrhea which resolved 01/11/2018 by the time of discharge with symptomatic treatment.  CT of the abdomen at that time showed diverticulosis with no evidence of diverticulitis.  GI panel at that time was negative.  STD PCR was sent from the ER today.  I will add probiotics.  If diarrhea continues to be persistent consider GI consult.  Hyperchloremia secondary to diarrhea.  #3 hypertension restart Avapro.  #4 AKI on CKD stage II continue IV hydration this is secondary to prerenal and diarrhea.    Estimated body mass index is 25.4 kg/m as calculated from the following:   Height as of this encounter: 5' (1.524 m).   Weight as of this encounter: 59 kg.   DVT prophylaxis: Lovenox Code Status: Full code Family Communication: None no family in the room Disposition Plan: Pending clinical improvement Consults called none Admission status: Observation   Alwyn Ren MD [facility]  If 7PM-7AM, please contact night-coverage [email or web address] Password [email or web address]  01/15/2018, 5:48 PM

## 2018-01-15 NOTE — ED Notes (Signed)
ED Provider at bedside. EDP J 

## 2018-01-15 NOTE — ED Notes (Signed)
ADMISSIONProvider at bedside. MATHEWS

## 2018-01-16 DIAGNOSIS — N183 Chronic kidney disease, stage 3 unspecified: Secondary | ICD-10-CM

## 2018-01-16 DIAGNOSIS — E162 Hypoglycemia, unspecified: Secondary | ICD-10-CM | POA: Diagnosis not present

## 2018-01-16 DIAGNOSIS — E876 Hypokalemia: Secondary | ICD-10-CM | POA: Diagnosis not present

## 2018-01-16 DIAGNOSIS — R627 Adult failure to thrive: Secondary | ICD-10-CM

## 2018-01-16 DIAGNOSIS — R197 Diarrhea, unspecified: Secondary | ICD-10-CM | POA: Diagnosis not present

## 2018-01-16 LAB — URINALYSIS, ROUTINE W REFLEX MICROSCOPIC
Bilirubin Urine: NEGATIVE
Glucose, UA: 50 mg/dL — AB
KETONES UR: NEGATIVE mg/dL
Leukocytes, UA: NEGATIVE
Nitrite: NEGATIVE
PH: 5 (ref 5.0–8.0)
PROTEIN: NEGATIVE mg/dL
Specific Gravity, Urine: 1.009 (ref 1.005–1.030)

## 2018-01-16 LAB — CBC
HCT: 31.7 % — ABNORMAL LOW (ref 36.0–46.0)
Hemoglobin: 10 g/dL — ABNORMAL LOW (ref 12.0–15.0)
MCH: 29.7 pg (ref 26.0–34.0)
MCHC: 31.5 g/dL (ref 30.0–36.0)
MCV: 94.1 fL (ref 80.0–100.0)
PLATELETS: 161 10*3/uL (ref 150–400)
RBC: 3.37 MIL/uL — ABNORMAL LOW (ref 3.87–5.11)
RDW: 13.9 % (ref 11.5–15.5)
WBC: 4.7 10*3/uL (ref 4.0–10.5)
nRBC: 0 % (ref 0.0–0.2)

## 2018-01-16 LAB — COMPREHENSIVE METABOLIC PANEL
ALK PHOS: 30 U/L — AB (ref 38–126)
ALT: 20 U/L (ref 0–44)
AST: 27 U/L (ref 15–41)
Albumin: 2.6 g/dL — ABNORMAL LOW (ref 3.5–5.0)
Anion gap: 6 (ref 5–15)
BUN: 9 mg/dL (ref 8–23)
CALCIUM: 7.4 mg/dL — AB (ref 8.9–10.3)
CO2: 17 mmol/L — ABNORMAL LOW (ref 22–32)
CREATININE: 1.23 mg/dL — AB (ref 0.44–1.00)
Chloride: 115 mmol/L — ABNORMAL HIGH (ref 98–111)
GFR calc Af Amer: 45 mL/min — ABNORMAL LOW (ref >60)
GFR calc non Af Amer: 39 mL/min — ABNORMAL LOW (ref >60)
Glucose, Bld: 191 mg/dL — ABNORMAL HIGH (ref 70–99)
Potassium: 3.4 mmol/L — ABNORMAL LOW (ref 3.5–5.1)
Sodium: 138 mmol/L (ref 135–145)
Total Bilirubin: 0.9 mg/dL (ref 0.3–1.2)
Total Protein: 4.6 g/dL — ABNORMAL LOW (ref 6.5–8.1)

## 2018-01-16 LAB — GLUCOSE, CAPILLARY: Glucose-Capillary: 159 mg/dL — ABNORMAL HIGH (ref 70–99)

## 2018-01-16 MED ORDER — POTASSIUM CHLORIDE 10 MEQ/100ML IV SOLN
10.0000 meq | INTRAVENOUS | Status: AC
  Administered 2018-01-16 – 2018-01-17 (×4): 10 meq via INTRAVENOUS
  Filled 2018-01-16 (×2): qty 100

## 2018-01-16 MED ORDER — GLUCERNA SHAKE PO LIQD
237.0000 mL | Freq: Three times a day (TID) | ORAL | Status: DC
  Administered 2018-01-16 – 2018-01-18 (×5): 237 mL via ORAL
  Filled 2018-01-16 (×13): qty 237

## 2018-01-16 NOTE — Progress Notes (Signed)
PROGRESS NOTE  Adrienne Gonzalez WCH:[id number] DOB: 17-Apr-1925 DOA: 01/15/2018 PCP: Laurann Montana, MD  HPI/Recap of past 24 hours:  reports watery diarrhea last night and early this am,  no appetite,  9 lbs weight loss since December  She denies ab pain, does report buttock pain from frequent diarrhea, no fever Multiple family at bedside  Assessment/Plan: Active Problems:   Hypoglycemia  Watery diarrhea since December with weight loss and progressive weakness -was hospitalized for the same at [facility] from 1/2 to 1/6ct ab and gi pcr panel was negative for acute findings -family report she used to be on valsartan but changed to olmesartan 30days ago, family reports diarrhea started after that, olmesartan induced colitis is on differential -am cortisol level unremarkable -cdiff pending collection, denies ab pain, no fever, no leukocytosis, denies recent abx use -case discussed with [facility] DR Bosie Clos who will see the patient in consult  Hypoglycemia: With h/o borderline diabetes not on meds Family think hypoglycemia is due to poor oral intake since patient started to have diarrhea in December.  Hypokalemia: Replace k, check mag  CKDIII Cr appear close to baseline  HTN:  Discontinue olmesartan Currently on avapro, bp stable  HLD:  continue statin  FTT: prior to diarrhea , patient is highly functional , exercise on stationary bike 45 minutes 3 times a week, able to go ups 18 stairs Since started having diarrhea, she is getting weaker ,now needing physical therapy Will need home health  Code Status: full  Family Communication: patient and family  Disposition Plan: home with home health, pending clinical improvement and GI clearance    Consultants:  [facility], Dr Bosie Clos   Procedures:  none  Antibiotics:  none   Objective: BP 126/60 (BP Location: Right Arm)   Pulse 77   Temp 98.5 F (36.9 C) (Oral)   Resp 14   Ht 5' (1.524 m)   Wt 59 kg   SpO2 100%    BMI 25.40 kg/m   Intake/Output Summary (Last 24 hours) at 01/16/2018 1343 Last data filed at 01/15/2018 1653 Gross per 24 hour  Intake 500 ml  Output -  Net 500 ml   Filed Weights   01/15/18 1346  Weight: 59 kg    Exam: Patient is examined daily including today on 01/16/2018, exams remain the same as of yesterday except that has changed    General:  NAD, hard of hearing, alert, pleasant, appear younger than stated age  Cardiovascular: RRR  Respiratory: CTABL  Abdomen: Soft/ND/NT, positive BS  Musculoskeletal: No Edema  Neuro: alert, oriented   Data Reviewed: Basic Metabolic Panel: Recent Labs  Lab 01/10/18 0612 01/11/18 0544 01/15/18 1541 01/16/18 0720  NA 138 140 140 138  K 4.5 5.0 3.8 3.4*  CL 116* 121* 113* 115*  CO2 17* 14* 21* 17*  GLUCOSE 147* 124* 114* 191*  BUN 14 11 16 9   CREATININE 1.59* 1.47* 1.50* 1.23*  CALCIUM 7.9* 8.2* 8.6* 7.4*  MG  --   --  2.1  --    Liver Function Tests: Recent Labs  Lab 01/10/18 0612 01/15/18 1541 01/16/18 0720  AST 19 34 27  ALT 21 24 20   ALKPHOS 32* 40 30*  BILITOT 0.9 0.8 0.9  PROT 4.9* 6.5 4.6*  ALBUMIN 2.6* 3.7 2.6*   Recent Labs  Lab 01/15/18 1541  LIPASE 16   No results for input(s): AMMONIA in the last 168 hours. CBC: Recent Labs  Lab 01/10/18 0612 01/15/18 1543 01/16/18 0720  WBC 5.1 5.1 4.7  NEUTROABS  --  3.4  --   HGB 10.2* 12.1 10.0*  HCT 32.4* 38.6 31.7*  MCV 96.4 94.8 94.1  PLT 211 207 161   Cardiac Enzymes:   No results for input(s): CKTOTAL, CKMB, CKMBINDEX, TROPONINI in the last 168 hours. BNP (last 3 results) No results for input(s): BNP in the last 8760 hours.  ProBNP (last 3 results) No results for input(s): PROBNP in the last 8760 hours.  CBG: Recent Labs  Lab 01/11/18 0709 01/15/18 1434 01/15/18 1455 01/15/18 1604 01/15/18 1947  GLUCAP 108* 24* 36* 114* 145*    Recent Results (from the past 240 hour(s))  Gastrointestinal Panel by PCR , Stool     Status: None    Collection Time: 01/08/18  2:53 PM  Result Value Ref Range Status   Campylobacter species NOT DETECTED NOT DETECTED Final   Plesimonas shigelloides NOT DETECTED NOT DETECTED Final   Salmonella species NOT DETECTED NOT DETECTED Final   Yersinia enterocolitica NOT DETECTED NOT DETECTED Final   Vibrio species NOT DETECTED NOT DETECTED Final   Vibrio cholerae NOT DETECTED NOT DETECTED Final   Enteroaggregative E coli (EAEC) NOT DETECTED NOT DETECTED Final   Enteropathogenic E coli (EPEC) NOT DETECTED NOT DETECTED Final   Enterotoxigenic E coli (ETEC) NOT DETECTED NOT DETECTED Final   Shiga like toxin producing E coli (STEC) NOT DETECTED NOT DETECTED Final   Shigella/Enteroinvasive E coli (EIEC) NOT DETECTED NOT DETECTED Final   Cryptosporidium NOT DETECTED NOT DETECTED Final   Cyclospora cayetanensis NOT DETECTED NOT DETECTED Final   Entamoeba histolytica NOT DETECTED NOT DETECTED Final   Giardia lamblia NOT DETECTED NOT DETECTED Final   Adenovirus F40/41 NOT DETECTED NOT DETECTED Final   Astrovirus NOT DETECTED NOT DETECTED Final   Norovirus GI/GII NOT DETECTED NOT DETECTED Final   Rotavirus A NOT DETECTED NOT DETECTED Final   Sapovirus (I, II, IV, and V) NOT DETECTED NOT DETECTED Final    Comment: Performed at [facility], [address] [place], [place] [address]     Studies: No results found.  Scheduled Meds: . aspirin EC  81 mg Oral Daily  . brimonidine  1 drop Both Eyes TID  . cholecalciferol  1,000 Units Oral Daily  . dorzolamide-timolol  1 drop Both Eyes BID  . enoxaparin (LOVENOX) injection  30 mg Subcutaneous Q24H  . feeding supplement (GLUCERNA SHAKE)  237 mL Oral TID BM  . irbesartan  37.5 mg Oral Daily  . latanoprost  1 drop Both Eyes QHS  . pravastatin  40 mg Oral Daily  . saccharomyces boulardii  500 mg Oral BID    Continuous Infusions: . dextrose 5 % and 0.9% NaCl 125 mL/hr at 01/16/18 0747     Time spent: I have personally reviewed and  interpreted on  01/16/2018 daily labs, imagings as discussed above under date review session and assessment and plans.  I reviewed all nursing notes, pharmacy notes, vitals, pertinent old records  I have discussed plan of care as described above with RN , patient and family on 01/16/2018   Albertine Grates MD, PhD  [facility] Pager [phone]. If 7PM-7AM, please contact night-coverage at [email or web address], password [facility] 01/16/2018, 1:43 PM  LOS: 0 days

## 2018-01-16 NOTE — Evaluation (Signed)
Physical Therapy Evaluation Patient Details Name: Adrienne Gonzalez MRN: [id number] DOB: 1925/03/24 Today's Date: 01/16/2018   History of Present Illness  85 y.o. female with medical history significant of type 2 diabetes, hypertension admitted with nausea vomiting and diarrhea.  [facility] and discharged 2 days ago for the same complaints.    Clinical Impression  Pt admitted with above diagnosis. Pt currently with functional limitations due to the deficits listed below (see PT Problem List). Pt very motivated and cooperative, will likely need HHPT at d/c given this is second admission in a week; pt is very active at her baseline, family supportive; Pt will benefit from skilled PT to increase their independence and safety with mobility to allow discharge to the venue listed below.       Follow Up Recommendations Home health PT;Supervision/Assistance - 24 hour    Equipment Recommendations  Rolling walker with 5" wheels(if agreeable)    Recommendations for Other Services       Precautions / Restrictions        Mobility  Bed Mobility Overal bed mobility: Needs Assistance Bed Mobility: Supine to Sit     Supine to sit: Min assist     General bed mobility comments: assist with trunk,incr time  Transfers Overall transfer level: Needs assistance Equipment used: Rolling walker (2 wheeled) Transfers: Sit to/from Stand Sit to Stand: Min assist;Min guard         General transfer comment: Steadying assist once on feet due to slight instability.   Ambulation/Gait Ambulation/Gait assistance: Min assist;Min guard Gait Distance (Feet): (and 10' more) Assistive device: Rolling walker (2 wheeled) Gait Pattern/deviations: Step-to pattern;Step-through pattern;Decreased stride length;Wide base of support     General Gait Details: cues for RW position and safety, intermittent assist for maneuvering RW, min/guard for safety otherwise  Stairs            Wheelchair  Mobility    Modified Rankin (Stroke Patients Only)       Balance Overall balance assessment: Needs assistance Sitting-balance support: No upper extremity supported;Feet supported Sitting balance-Leahy Scale: Fair     Standing balance support: Bilateral upper extremity supported;During functional activity Standing balance-Leahy Scale: Fair Standing balance comment: pt is able to stand staically without support                             Pertinent Vitals/Pain      Home Living Family/patient expects to be discharged to:: Private residence Living Arrangements: Children Available Help at Discharge: Family Type of Home: House Home Access: Stairs to enter Entrance Stairs-Rails: Doctor, general practiceight;Left Entrance Stairs-Number of Steps: 7 Home Layout: Laundry or work area in basement;Able to live on main level with bedroom/bathroom Home Equipment: Tub bench      Prior Function Level of Independence: Independent         Comments: Pt very active and enjoys stationary biking 3x/week      Hand Dominance        Extremity/Trunk Assessment                Communication   Communication: No difficulties  Cognition Arousal/Alertness: Awake/alert Behavior During Therapy: WFL for tasks assessed/performed Overall Cognitive Status: History of cognitive impairments - at baseline                                 General Comments: Pt oriented to self  and family, pleasant and cooperative      General Comments      Exercises     Assessment/Plan    PT Assessment Patient needs continued PT services  PT Problem List Decreased activity tolerance;Decreased balance;Decreased mobility;Decreased knowledge of use of DME;Decreased safety awareness;Decreased knowledge of precautions;Decreased cognition;Decreased strength       PT Treatment Interventions DME instruction;Gait training;Functional mobility training;Therapeutic activities;Therapeutic exercise;Balance  training;Patient/family education;Stair training    PT Goals (Current goals can be found in the Care Plan section)  Acute Rehab PT Goals Patient Stated Goal: to go home PT Goal Formulation: With patient Time For Goal Achievement: 01/30/18 Potential to Achieve Goals: Good    Frequency Min 3X/week   Barriers to discharge        Co-evaluation               AM-PAC PT "6 Clicks" Mobility  Outcome Measure Help needed turning from your back to your side while in a flat bed without using bedrails?: A Little Help needed moving from lying on your back to sitting on the side of a flat bed without using bedrails?: A Little Help needed moving to and from a bed to a chair (including a wheelchair)?: A Little Help needed standing up from a chair using your arms (e.g., wheelchair or bedside chair)?: A Little Help needed to walk in hospital room?: A Little Help needed climbing 3-5 steps with a railing? : A Lot 6 Click Score: 17    End of Session Equipment Utilized During Treatment: Gait belt Activity Tolerance: Patient tolerated treatment well Patient left: in chair;with call bell/phone within reach;with chair alarm set   PT Visit Diagnosis: Unsteadiness on feet (R26.81);Muscle weakness (generalized) (M62.81)    Time: 4098-11911222-1245 PT Time Calculation (min) (ACUTE ONLY): 23 min   Charges:   PT Evaluation $PT Eval Low Complexity: 1 Low PT Treatments $Gait Training: 8-22 mins        Drucilla Chaletara Jaelon Gatley, PT  Pager: [phone] Acute Rehab Dept [facility]): [phone]   01/16/2018   [facility]Kristyl Athens 01/16/2018, 1:30 PM

## 2018-01-16 NOTE — Consult Note (Signed)
Referring Provider: Dr. Roda Shutters Primary Care Physician:  Laurann Montana, MD Primary Gastroenterologist:  Dr. Randa Evens  Reason for Consultation:  Diarrhea  HPI: Adrienne Gonzalez is a 85 y.o. female with DM, HTN who was recently discharged from [facility] after being worked up there for failure to thrive with diarrhea, weakness, and weight loss. GI pathogen panel negative there. Daughter gives majority of the history and said after discharge from [facility] she had ongoing profuse watery diarrhea and no appetite to the point that she would not take anything by mouth. No history of rectal bleeding. Denies abdominal pain. Changed from Valsartan to Olmesartan in late October and denies any diarrhea until 12/29/17. Colonoscopy in 02/2011 by Dr. Randa Evens showed medium-sized internal hemorrhoids and sigmoid diverticulosis. Personal history of colon polyps. No acute changes on CT on 01/08/18.   Past Medical History:  Diagnosis Date  . Chronic renal insufficiency, stage 1   . Diabetes mellitus without complication (HCC)   . Hypertension     History reviewed. No pertinent surgical history.  Prior to Admission medications   Medication Sig Start Date End Date Taking? Authorizing Provider  aspirin EC 81 MG tablet Take 81 mg by mouth daily.   Yes [provider]  bimatoprost (LUMIGAN) 0.01 % SOLN Place 1 drop into both eyes at bedtime.   Yes [provider]  brimonidine (ALPHAGAN P) 0.1 % SOLN Place 1 drop into both eyes 3 (three) times daily.   Yes [provider]  cholecalciferol (VITAMIN D3) 25 MCG (1000 UT) tablet Take 1,000 Units by mouth daily.   Yes [provider]  dorzolamide-timolol (COSOPT) 22.3-6.8 MG/ML ophthalmic solution Place 1 drop into both eyes 2 (two) times daily.   Yes [provider]  olmesartan (BENICAR) 40 MG tablet Take 40 mg by mouth daily.   Yes [provider]  pravastatin (PRAVACHOL) 40 MG tablet Take 40 mg by mouth daily.   Yes [provider]    Scheduled Meds: . aspirin EC  81 mg Oral Daily  . brimonidine  1 drop Both Eyes TID  . cholecalciferol  1,000 Units Oral Daily  . dorzolamide-timolol  1 drop Both Eyes BID  . enoxaparin (LOVENOX) injection  30 mg Subcutaneous Q24H  . feeding supplement (GLUCERNA SHAKE)  237 mL Oral TID BM  . irbesartan  37.5 mg Oral Daily  . latanoprost  1 drop Both Eyes QHS  . pravastatin  40 mg Oral Daily  . saccharomyces boulardii  500 mg Oral BID   Continuous Infusions: . dextrose 5 % and 0.9% NaCl 125 mL/hr at 01/16/18 1800  . potassium chloride 10 mEq (01/16/18 2030)   PRN Meds:.  Allergies as of 01/15/2018 - Review Complete 01/15/2018  Allergen Reaction Noted  . Benzocaine Other (See Comments) 01/07/2018  . Metformin and related Diarrhea 01/07/2018  . Morphine and related Itching 01/07/2018    No family history on file.  Social History   Socioeconomic History  . Marital status: Widowed    Spouse name: Not on file  . Number of children: Not on file  . Years of education: Not on file  . Highest education level: Not on file  Occupational History  . Not on file  Social Needs  . Financial resource strain: Not on file  . Food insecurity:    Worry: Never true    Inability: Never true  . Transportation needs:    Medical: No    Non-medical: No  Tobacco Use  . Smoking status: Never  Smoker  . Smokeless tobacco: Never Used  Substance and Sexual Activity  . Alcohol use: Never    Frequency: Never  . Drug use: Never  . Sexual activity: Not on file  Lifestyle  . Physical activity:    Days per week: Patient refused    Minutes per session: Patient refused  . Stress: Not at all  Relationships  . Social connections:    Talks on phone: Patient refused    Gets together: Patient refused    Attends religious service: Patient refused    Active member of club or organization: Patient refused    Attends meetings of clubs or organizations: Patient refused    Relationship status:  Patient refused  . Intimate partner violence:    Fear of current or ex partner: Patient refused    Emotionally abused: Patient refused    Physically abused: Patient refused    Forced sexual activity: Patient refused  Other Topics Concern  . Not on file  Social History Narrative  . Not on file    Review of Systems: All negative except as stated above in HPI.  Physical Exam: Vital signs: Vitals:   01/16/18 0522 01/16/18 1443  BP: 126/60 (!) 141/63  Pulse: 77 70  Resp: 14 18  Temp: 98.5 F (36.9 C) 98.7 F (37.1 C)  SpO2: 100% 100%     General:   Lethargic, elderly, thin, no acute distress, pleasant Head: normocephalic, atraumatic Eyes: anicteric sclera ENT: oropharynx clear Neck: supple, nontender Lungs:  Clear throughout to auscultation.   No wheezes, crackles, or rhonchi. No acute distress. Heart:  Regular rate and rhythm; no murmurs, clicks, rubs,  or gallops. Abdomen: soft, nontender, nondistended, +BS  Rectal:  Deferred Ext: no edema  GI:  Lab Results: Recent Labs    01/15/18 1543 01/16/18 0720  WBC 5.1 4.7  HGB 12.1 10.0*  HCT 38.6 31.7*  PLT 207 161   BMET Recent Labs    01/15/18 1541 01/16/18 0720  NA 140 138  K 3.8 3.4*  CL 113* 115*  CO2 21* 17*  GLUCOSE 114* 191*  BUN 16 9  CREATININE 1.50* 1.23*  CALCIUM 8.6* 7.4*   LFT Recent Labs    01/16/18 0720  PROT 4.6*  ALBUMIN 2.6*  AST 27  ALT 20  ALKPHOS 30*  BILITOT 0.9   PT/INR No results for input(s): LABPROT, INR in the last 72 hours.   Studies/Results: No results found.  Impression/Plan: Failure to thrive with persistent watery diarrhea for the past 2.5 weeks and worsening weakness. Agree with C. Diff check to evaluate for a nosocomial source. If C. Diff is negative and diarrhea persists (no stool since admit per daughter) then may need a flexible sigmoidoscopy to further evaluate for inflammation. Continue supportive care. Will follow.    LOS: 0 days   Shirley FriarVincent C Fread Kottke   01/16/2018, 8:31 PM  Questions please call [phone]

## 2018-01-17 DIAGNOSIS — R627 Adult failure to thrive: Secondary | ICD-10-CM | POA: Diagnosis not present

## 2018-01-17 DIAGNOSIS — N179 Acute kidney failure, unspecified: Secondary | ICD-10-CM

## 2018-01-17 DIAGNOSIS — R197 Diarrhea, unspecified: Secondary | ICD-10-CM | POA: Diagnosis not present

## 2018-01-17 DIAGNOSIS — E876 Hypokalemia: Secondary | ICD-10-CM | POA: Diagnosis not present

## 2018-01-17 DIAGNOSIS — E162 Hypoglycemia, unspecified: Secondary | ICD-10-CM | POA: Diagnosis not present

## 2018-01-17 LAB — CBC WITH DIFFERENTIAL/PLATELET
Abs Immature Granulocytes: 0.02 10*3/uL (ref 0.00–0.07)
Basophils Absolute: 0 10*3/uL (ref 0.0–0.1)
Basophils Relative: 0 %
Eosinophils Absolute: 0.1 10*3/uL (ref 0.0–0.5)
Eosinophils Relative: 3 %
HCT: 30.5 % — ABNORMAL LOW (ref 36.0–46.0)
Hemoglobin: 9.8 g/dL — ABNORMAL LOW (ref 12.0–15.0)
Immature Granulocytes: 0 %
Lymphocytes Relative: 33 %
Lymphs Abs: 1.5 10*3/uL (ref 0.7–4.0)
MCH: 29.4 pg (ref 26.0–34.0)
MCHC: 32.1 g/dL (ref 30.0–36.0)
MCV: 91.6 fL (ref 80.0–100.0)
MONO ABS: 0.4 10*3/uL (ref 0.1–1.0)
Monocytes Relative: 10 %
NEUTROS ABS: 2.4 10*3/uL (ref 1.7–7.7)
Neutrophils Relative %: 54 %
Platelets: UNDETERMINED 10*3/uL (ref 150–400)
RBC: 3.33 MIL/uL — ABNORMAL LOW (ref 3.87–5.11)
RDW: 13.9 % (ref 11.5–15.5)
WBC: 4.5 10*3/uL (ref 4.0–10.5)
nRBC: 0 % (ref 0.0–0.2)

## 2018-01-17 LAB — BASIC METABOLIC PANEL
ANION GAP: 5 (ref 5–15)
BUN: 6 mg/dL — ABNORMAL LOW (ref 8–23)
CO2: 15 mmol/L — ABNORMAL LOW (ref 22–32)
Calcium: 7.5 mg/dL — ABNORMAL LOW (ref 8.9–10.3)
Chloride: 118 mmol/L — ABNORMAL HIGH (ref 98–111)
Creatinine, Ser: 1.08 mg/dL — ABNORMAL HIGH (ref 0.44–1.00)
GFR calc Af Amer: 52 mL/min — ABNORMAL LOW (ref >60)
GFR calc non Af Amer: 45 mL/min — ABNORMAL LOW (ref >60)
GLUCOSE: 187 mg/dL — AB (ref 70–99)
Potassium: 4.7 mmol/L (ref 3.5–5.1)
Sodium: 138 mmol/L (ref 135–145)

## 2018-01-17 LAB — GLUCOSE, CAPILLARY
GLUCOSE-CAPILLARY: 132 mg/dL — AB (ref 70–99)
Glucose-Capillary: 165 mg/dL — ABNORMAL HIGH (ref 70–99)
Glucose-Capillary: 207 mg/dL — ABNORMAL HIGH (ref 70–99)
Glucose-Capillary: 189 mg/dL — ABNORMAL HIGH (ref 70–99)

## 2018-01-17 LAB — MAGNESIUM: Magnesium: 1.5 mg/dL — ABNORMAL LOW (ref 1.7–2.4)

## 2018-01-17 MED ORDER — POTASSIUM CHLORIDE 10 MEQ/100ML IV SOLN
INTRAVENOUS | Status: AC
  Administered 2018-01-17: 10 meq via INTRAVENOUS
  Filled 2018-01-17: qty 100

## 2018-01-17 MED ORDER — LACTATED RINGERS IV SOLN
INTRAVENOUS | Status: DC
  Administered 2018-01-17: 17:00:00 via INTRAVENOUS

## 2018-01-17 MED ORDER — MAGNESIUM SULFATE 2 GM/50ML IV SOLN
2.0000 g | Freq: Once | INTRAVENOUS | Status: AC
  Administered 2018-01-17: 2 g via INTRAVENOUS
  Filled 2018-01-17: qty 50

## 2018-01-17 MED ORDER — GERHARDT'S BUTT CREAM
TOPICAL_CREAM | Freq: Two times a day (BID) | CUTANEOUS | Status: DC
  Administered 2018-01-17: 1 via TOPICAL
  Administered 2018-01-17 – 2018-01-21 (×9): via TOPICAL
  Administered 2018-01-22: 1 via TOPICAL
  Administered 2018-01-23 – 2018-01-25 (×5): via TOPICAL
  Filled 2018-01-17: qty 1

## 2018-01-17 NOTE — Progress Notes (Signed)
PROGRESS NOTE  Adrienne Gonzalez ZOX:[id number] DOB: February 03, 1925 DOA: 01/15/2018 PCP: Laurann MontanaWhite, Cynthia, MD  HPI/Recap of past 24 hours:  Appear to have more watery diarrhea at night, however, stool sample has not been collected since hospitalization  She denies ab pain, does report buttock pain from frequent diarrhea, no fever family at bedside  Assessment/Plan: Active Problems:   Hypoglycemia   FTT (failure to thrive) in adult   Hypokalemia   CKD (chronic kidney disease), stage III (HCC)  Watery diarrhea since December with weight loss and progressive weakness --[facility] 1/6ct ab and gi pcr panel was negative for acute findings -family report she used to be on valsartan but changed to olmesartan 30days ago, family reports diarrhea started after that, olmesartan induced colitis is on differential -am cortisol level unremarkable -cdiff pending collection, denies ab pain, no fever, no leukocytosis, denies recent abx use -[facility] Dr Bosie ClosSchooler consulted, input appreciated  Hypoglycemia: With h/o borderline diabetes not on meds a1c 6.4 Family think hypoglycemia is due to poor oral intake since patient started to have diarrhea in December. She is started on d5ns, am blood glucose now elevated, d/c d5ns, change to LR  Hypokalemia/hypomagnesemia : Replace k/ mag  AKI on CKDIII Bun/cr on presentation 16/1.5 Bun/cr today 6/1.08 with hydration Continue hydration due to continued diarrhea and poor oral intake,  Renal dosing meds  HTN:  Discontinue olmesartan Currently on avapro, bp stable  HLD:  continue statin  FTT: prior to diarrhea , patient is highly functional , exercise on stationary bike 45 minutes 3 times a week, able to go ups 18 stairs Since started having diarrhea, she is getting weaker ,now needing physical therapy Will need home health  Code Status: full  Family Communication: patient and family  Disposition Plan: home  with home health, pending clinical improvement and GI clearance    Consultants:  [facility], Dr Bosie ClosSchooler   Procedures:  none  Antibiotics:  none   Objective: BP 134/72 (BP Location: Right Arm)   Pulse 82   Temp 98.8 F (37.1 C) (Oral)   Resp 20   Ht 5' (1.524 m)   Wt 59 kg   SpO2 100%   BMI 25.40 kg/m   Intake/Output Summary (Last 24 hours) at 01/17/2018 1239 Last data filed at 01/16/2018 2300 Gross per 24 hour  Intake 2120 ml  Output -  Net 2120 ml   Filed Weights   01/15/18 1346  Weight: 59 kg    Exam: Patient is examined daily including today on 01/17/2018, exams remain the same as of yesterday except that has changed    General:  NAD, hard of hearing, alert, pleasant, appear younger than stated age  Cardiovascular: RRR  Respiratory: CTABL  Abdomen: Soft/ND/NT, positive BS  Musculoskeletal: No Edema  Neuro: alert, oriented   Data Reviewed: Basic Metabolic Panel: Recent Labs  Lab 01/11/18 0544 01/15/18 1541 01/16/18 0720 01/17/18 0555  NA 140 140 138 138  K 5.0 3.8 3.4* 4.7  CL 121* 113* 115* 118*  CO2 14* 21* 17* 15*  GLUCOSE 124* 114* 191* 187*  BUN 11 16 9  6*  CREATININE 1.47* 1.50* 1.23* 1.08*  CALCIUM 8.2* 8.6* 7.4* 7.5*  MG  --  2.1  --  1.5*   Liver Function Tests: Recent Labs  Lab 01/15/18 1541 01/16/18 0720  AST 34 27  ALT 24 20  ALKPHOS 40 30*  BILITOT 0.8 0.9  PROT 6.5 4.6*  ALBUMIN 3.7 2.6*   Recent Labs  Lab 01/15/18 1541  LIPASE 16   No results for input(s): AMMONIA in the last 168 hours. CBC: Recent Labs  Lab 01/15/18 1543 01/16/18 0720 01/17/18 0555  WBC 5.1 4.7 4.5  NEUTROABS 3.4  --  2.4  HGB 12.1 10.0* 9.8*  HCT 38.6 31.7* 30.5*  MCV 94.8 94.1 91.6  PLT 207 161 PLATELET CLUMPS NOTED ON SMEAR, UNABLE TO ESTIMATE   Cardiac Enzymes:   No results for input(s): CKTOTAL, CKMB, CKMBINDEX, TROPONINI in the last 168 hours. BNP (last 3 results) No results for input(s): BNP in the last 8760  hours.  ProBNP (last 3 results) No results for input(s): PROBNP in the last 8760 hours.  CBG: Recent Labs  Lab 01/15/18 1604 01/15/18 1947 01/16/18 2200 01/17/18 0713 01/17/18 1142  GLUCAP 114* 145* 159* 165* 207*    Recent Results (from the past 240 hour(s))  Gastrointestinal Panel by PCR , Stool     Status: None   Collection Time: 01/08/18  2:53 PM  Result Value Ref Range Status   Campylobacter species NOT DETECTED NOT DETECTED Final   Plesimonas shigelloides NOT DETECTED NOT DETECTED Final   Salmonella species NOT DETECTED NOT DETECTED Final   Yersinia enterocolitica NOT DETECTED NOT DETECTED Final   Vibrio species NOT DETECTED NOT DETECTED Final   Vibrio cholerae NOT DETECTED NOT DETECTED Final   Enteroaggregative E coli (EAEC) NOT DETECTED NOT DETECTED Final   Enteropathogenic E coli (EPEC) NOT DETECTED NOT DETECTED Final   Enterotoxigenic E coli (ETEC) NOT DETECTED NOT DETECTED Final   Shiga like toxin producing E coli (STEC) NOT DETECTED NOT DETECTED Final   Shigella/Enteroinvasive E coli (EIEC) NOT DETECTED NOT DETECTED Final   Cryptosporidium NOT DETECTED NOT DETECTED Final   Cyclospora cayetanensis NOT DETECTED NOT DETECTED Final   Entamoeba histolytica NOT DETECTED NOT DETECTED Final   Giardia lamblia NOT DETECTED NOT DETECTED Final   Adenovirus F40/41 NOT DETECTED NOT DETECTED Final   Astrovirus NOT DETECTED NOT DETECTED Final   Norovirus GI/GII NOT DETECTED NOT DETECTED Final   Rotavirus A NOT DETECTED NOT DETECTED Final   Sapovirus (I, II, IV, and V) NOT DETECTED NOT DETECTED Final    Comment: Performed at [facility], [address], [place], [place] [address]     Studies: No results found.  Scheduled Meds: . aspirin EC  81 mg Oral Daily  . brimonidine  1 drop Both Eyes TID  . cholecalciferol  1,000 Units Oral Daily  . dorzolamide-timolol  1 drop Both Eyes BID  . enoxaparin (LOVENOX) injection  30 mg Subcutaneous Q24H  . feeding supplement  (GLUCERNA SHAKE)  237 mL Oral TID BM  . Gerhardt's butt cream   Topical BID  . irbesartan  37.5 mg Oral Daily  . latanoprost  1 drop Both Eyes QHS  . pravastatin  40 mg Oral Daily  . saccharomyces boulardii  500 mg Oral BID    Continuous Infusions: . dextrose 5 % and 0.9% NaCl 125 mL/hr at 01/16/18 1800     Time spent: I have personally reviewed and interpreted on  01/17/2018 daily labs, imagings as discussed above under date review session and assessment and plans.  I reviewed all nursing notes, pharmacy notes, vitals, pertinent old records  I have discussed plan of care as described above with RN , patient and family on 01/17/2018   Albertine Grates MD, PhD  [facility] Pager [phone]. If 7PM-7AM, please contact night-coverage  at [email or web address], password [email or web address] 01/17/2018, 12:39 PM  LOS: 0 days

## 2018-01-18 DIAGNOSIS — R197 Diarrhea, unspecified: Secondary | ICD-10-CM | POA: Diagnosis not present

## 2018-01-18 DIAGNOSIS — N179 Acute kidney failure, unspecified: Secondary | ICD-10-CM | POA: Diagnosis not present

## 2018-01-18 DIAGNOSIS — R627 Adult failure to thrive: Secondary | ICD-10-CM | POA: Diagnosis not present

## 2018-01-18 LAB — MAGNESIUM: Magnesium: 1.9 mg/dL (ref 1.7–2.4)

## 2018-01-18 LAB — BASIC METABOLIC PANEL
Anion gap: 6 (ref 5–15)
BUN: 8 mg/dL (ref 8–23)
CO2: 17 mmol/L — ABNORMAL LOW (ref 22–32)
CREATININE: 1.01 mg/dL — AB (ref 0.44–1.00)
Calcium: 7.8 mg/dL — ABNORMAL LOW (ref 8.9–10.3)
Chloride: 114 mmol/L — ABNORMAL HIGH (ref 98–111)
GFR calc Af Amer: 57 mL/min — ABNORMAL LOW (ref >60)
GFR calc non Af Amer: 49 mL/min — ABNORMAL LOW (ref >60)
GLUCOSE: 152 mg/dL — AB (ref 70–99)
Potassium: 4.2 mmol/L (ref 3.5–5.1)
Sodium: 137 mmol/L (ref 135–145)

## 2018-01-18 LAB — CBC
HCT: 32.3 % — ABNORMAL LOW (ref 36.0–46.0)
Hemoglobin: 10.3 g/dL — ABNORMAL LOW (ref 12.0–15.0)
MCH: 29.8 pg (ref 26.0–34.0)
MCHC: 31.9 g/dL (ref 30.0–36.0)
MCV: 93.4 fL (ref 80.0–100.0)
PLATELETS: 176 10*3/uL (ref 150–400)
RBC: 3.46 MIL/uL — ABNORMAL LOW (ref 3.87–5.11)
RDW: 13.9 % (ref 11.5–15.5)
WBC: 7.3 10*3/uL (ref 4.0–10.5)
nRBC: 0 % (ref 0.0–0.2)

## 2018-01-18 LAB — GLUCOSE, CAPILLARY
Glucose-Capillary: 132 mg/dL — ABNORMAL HIGH (ref 70–99)
Glucose-Capillary: 143 mg/dL — ABNORMAL HIGH (ref 70–99)
Glucose-Capillary: 147 mg/dL — ABNORMAL HIGH (ref 70–99)
Glucose-Capillary: 161 mg/dL — ABNORMAL HIGH (ref 70–99)

## 2018-01-18 LAB — C DIFFICILE QUICK SCREEN W PCR REFLEX
C Diff antigen: NEGATIVE
C Diff interpretation: NOT DETECTED
C Diff toxin: NEGATIVE

## 2018-01-18 MED ORDER — FLEET ENEMA 7-19 GM/118ML RE ENEM
1.0000 | ENEMA | Freq: Once | RECTAL | Status: AC
  Administered 2018-01-19: 1 via RECTAL
  Filled 2018-01-18: qty 1

## 2018-01-18 MED ORDER — COLESTIPOL HCL 1 G PO TABS
1.0000 g | ORAL_TABLET | Freq: Two times a day (BID) | ORAL | Status: DC
  Administered 2018-01-18 – 2018-01-25 (×13): 1 g via ORAL
  Filled 2018-01-18 (×14): qty 1

## 2018-01-18 MED ORDER — SODIUM CHLORIDE 0.9 % IV SOLN
INTRAVENOUS | Status: DC
  Administered 2018-01-18 – 2018-01-19 (×2): via INTRAVENOUS

## 2018-01-18 NOTE — Consult Note (Signed)
WOC Nurse wound consult note Reason for Consult:Moisture associated skin damage to bilateral buttocks and perineal skin.   Wound type: MASD from incontinence Pressure Injury POA: NA Stage 1 to coccyx was identified at admission. Patient at risk for further breakdown from pressure and moisture Measurement:scattered nonintact lesions to buttocks and perineal skin Wound NWG:NFAO and moist Drainage (amount, consistency, odor) scant weeping Periwound:macerated skin from moisture Dressing procedure/placement/frequency:Gerhardts butt paste twice daily.  Will not follow at this time.  Please re-consult if needed.  Maple Hudson MSN, RN, FNP-BC CWON Wound, Ostomy, Continence Nurse Pager [phone]

## 2018-01-18 NOTE — Progress Notes (Signed)
PROGRESS NOTE  Adrienne Gonzalez EXB:[id number] DOB: 1925/11/13 DOA: 01/15/2018 PCP: Laurann Montana, MD  HPI/Recap of past 24 hours:  Sitting up in chair, rectal tube in place with loose /watery stool  She denies ab pain, does report buttock pain from frequent diarrhea, no fever, no n/v   Assessment/Plan: Active Problems:   Hypoglycemia   FTT (failure to thrive) in adult   Hypokalemia   CKD (chronic kidney disease), stage III (HCC)  Watery diarrhea since December with weight loss and progressive weakness --was hospitalized for the same [facility] from 1/2 to 1/6ct ab and gi pcr panel was negative for acute findings -family report she used to be on valsartan but changed to olmesartan 30days ago, family reports diarrhea started after that, olmesartan induced colitis is on differential -am cortisol level unremarkable -cdiff finally collected now in process, denies ab pain, no fever, no leukocytosis, denies recent abx use -[facility]  consulted, input appreciated  Hypoglycemia: With h/o borderline diabetes not on meds a1c 6.4 Family think hypoglycemia is due to poor oral intake since patient started to have diarrhea in December. She is started on d5ns, am blood glucose now elevated, d/c d5ns, change to LR  Hypokalemia/hypomagnesemia : Replace k/ mag  AKI on CKDIII Bun/cr on presentation 16/1.5 Bun/cr stable around  6/1.08 for the last two days, d/c hydration, encourage oral intake Renal dosing meds  HTN:  Discontinue olmesartan Currently on avapro, bp stable  HLD:  continue statin  FTT: prior to diarrhea , patient is highly functional , exercise on stationary bike 45 minutes 3 times a week, able to go ups 18 stairs Since started having diarrhea, she is getting weaker ,now needing physical therapy Will need home health  Code Status: full  Family Communication: patient  Disposition Plan: home with home health, pending clinical improvement and GI clearance     Consultants:  [facility], Dr Bosie Clos   Procedures:  none  Antibiotics:  none   Objective: BP (!) 159/77 (BP Location: Right Arm)   Pulse 81   Temp 99.2 F (37.3 C) (Oral)   Resp 18   Ht 5' (1.524 m)   Wt 59 kg   SpO2 100%   BMI 25.40 kg/m   Intake/Output Summary (Last 24 hours) at 01/18/2018 1138 Last data filed at 01/18/2018 0700 Gross per 24 hour  Intake 3409.61 ml  Output 2 ml  Net 3407.61 ml   Filed Weights   01/15/18 1346  Weight: 59 kg    Exam: Patient is examined daily including today on 01/18/2018, exams remain the same as of yesterday except that has changed    General:  NAD, hard of hearing, alert, pleasant, appear younger than stated age  Cardiovascular: RRR  Respiratory: CTABL  Abdomen: Soft/ND/NT, positive BS  Musculoskeletal: No Edema  Neuro: alert, oriented x3  Data Reviewed: Basic Metabolic Panel: Recent Labs  Lab 01/15/18 1541 01/16/18 0720 01/17/18 0555 01/18/18 0735  NA 140 138 138 137  K 3.8 3.4* 4.7 4.2  CL 113* 115* 118* 114*  CO2 21* 17* 15* 17*  GLUCOSE 114* 191* 187* 152*  BUN 16 9 6* 8  CREATININE 1.50* 1.23* 1.08* 1.01*  CALCIUM 8.6* 7.4* 7.5* 7.8*  MG 2.1  --  1.5* 1.9   Liver Function Tests: Recent Labs  Lab 01/15/18 1541 01/16/18 0720  AST 34 27  ALT 24 20  ALKPHOS 40 30*  BILITOT 0.8 0.9  PROT 6.5 4.6*  ALBUMIN 3.7 2.6*   Recent  Labs  Lab 01/15/18 1541  LIPASE 16   No results for input(s): AMMONIA in the last 168 hours. CBC: Recent Labs  Lab 01/15/18 1543 01/16/18 0720 01/17/18 0555 01/18/18 0735  WBC 5.1 4.7 4.5 7.3  NEUTROABS 3.4  --  2.4  --   HGB 12.1 10.0* 9.8* 10.3*  HCT 38.6 31.7* 30.5* 32.3*  MCV 94.8 94.1 91.6 93.4  PLT 207 161 PLATELET CLUMPS NOTED ON SMEAR, UNABLE TO ESTIMATE 176   Cardiac Enzymes:   No results for input(s): CKTOTAL, CKMB, CKMBINDEX, TROPONINI in the last 168 hours. BNP (last 3 results) No results for input(s): BNP in the last 8760 hours.  ProBNP  (last 3 results) No results for input(s): PROBNP in the last 8760 hours.  CBG: Recent Labs  Lab 01/17/18 1142 01/17/18 1740 01/17/18 2107 01/18/18 0728 01/18/18 1132  GLUCAP 207* 189* 132* 132* 143*    Recent Results (from the past 240 hour(s))  Gastrointestinal Panel by PCR , Stool     Status: None   Collection Time: 01/08/18  2:53 PM  Result Value Ref Range Status   Campylobacter species NOT DETECTED NOT DETECTED Final   Plesimonas shigelloides NOT DETECTED NOT DETECTED Final   Salmonella species NOT DETECTED NOT DETECTED Final   Yersinia enterocolitica NOT DETECTED NOT DETECTED Final   Vibrio species NOT DETECTED NOT DETECTED Final   Vibrio cholerae NOT DETECTED NOT DETECTED Final   Enteroaggregative E coli (EAEC) NOT DETECTED NOT DETECTED Final   Enteropathogenic E coli (EPEC) NOT DETECTED NOT DETECTED Final   Enterotoxigenic E coli (ETEC) NOT DETECTED NOT DETECTED Final   Shiga like toxin producing E coli (STEC) NOT DETECTED NOT DETECTED Final   Shigella/Enteroinvasive E coli (EIEC) NOT DETECTED NOT DETECTED Final   Cryptosporidium NOT DETECTED NOT DETECTED Final   Cyclospora cayetanensis NOT DETECTED NOT DETECTED Final   Entamoeba histolytica NOT DETECTED NOT DETECTED Final   Giardia lamblia NOT DETECTED NOT DETECTED Final   Adenovirus F40/41 NOT DETECTED NOT DETECTED Final   Astrovirus NOT DETECTED NOT DETECTED Final   Norovirus GI/GII NOT DETECTED NOT DETECTED Final   Rotavirus A NOT DETECTED NOT DETECTED Final   Sapovirus (I, II, IV, and V) NOT DETECTED NOT DETECTED Final    Comment: Performed at [facility], [address] [place], [place] [address]  C Difficile Quick Screen w PCR reflex     Status: None   Collection Time: 01/18/18 10:40 AM  Result Value Ref Range Status   C Diff antigen NEGATIVE NEGATIVE Final   C Diff toxin NEGATIVE NEGATIVE Final   C Diff interpretation No C. difficile detected.  Final    Comment: Performed at [facility], [address] [place], [place] [address]     Studies: No results found.  Scheduled Meds: . aspirin EC  81 mg Oral Daily  . brimonidine  1 drop Both Eyes TID  . cholecalciferol  1,000 Units Oral Daily  . dorzolamide-timolol  1 drop Both Eyes BID  . enoxaparin (LOVENOX) injection  30 mg Subcutaneous Q24H  . feeding supplement (GLUCERNA SHAKE)  237 mL Oral TID BM  . Gerhardt's butt cream   Topical BID  . irbesartan  37.5 mg Oral Daily  . latanoprost  1 drop Both Eyes QHS  . pravastatin  40 mg Oral Daily  . saccharomyces boulardii  500 mg Oral BID    Continuous Infusions: . lactated ringers 75 mL/hr at 01/18/18 [id number]     Time  spent: 25mins, case discussed with GI I have personally reviewed and interpreted on  01/18/2018 daily labs, imagings as discussed above under date review session and assessment and plans.  I reviewed all nursing notes, pharmacy notes, vitals, pertinent old records  I have discussed plan of care as described above with RN , patient  on 01/18/2018   Albertine GratesFang Timya Trimmer MD, PhD  [facility] Pager [phone]. If 7PM-7AM, please contact night-coverage at [email or web address], password [email or web address] 01/18/2018, 11:38 AM  LOS: 0 days

## 2018-01-18 NOTE — Progress Notes (Signed)
Pt with orders for CDiff PCR of stool. Since placement of flexiseal pt has had no stool. @ 0425 pt noted to have a very small amount of stool. Collected what was present and sent to lab. Unlikely that quantity is sufficient to run test, but will attempt to collect and result sample present.

## 2018-01-18 NOTE — Progress Notes (Signed)
[facility] Gastroenterology Progress Note  Adrienne Gonzalez 85 y.o. 11/09/1925  CC: Diarrhea, weight loss, failure to thrive   Subjective: Continues to have loose stools.  Has abdominal pain.  Had 2 episodes of vomiting this morning.  Denies blood in the stool.  ROS : Negative for chest pain and shortness of breath.   Objective: Vital signs in last 24 hours: Vitals:   01/17/18 2050 01/18/18 0430  BP: (!) 158/70 (!) 159/77  Pulse: 78 81  Resp: 18 18  Temp: 98.6 F (37 C) 99.2 F (37.3 C)  SpO2: 100% 100%    Physical Exam:  General.  Elderly patient not in acute distress Heart : RRR ABD : Soft, nontender, nondistended, bowel sounds present. Neuro : A/O x 3,  Psych : Mood and affect normal.  Lab Results: Recent Labs    01/17/18 0555 01/18/18 0735  NA 138 137  K 4.7 4.2  CL 118* 114*  CO2 15* 17*  GLUCOSE 187* 152*  BUN 6* 8  CREATININE 1.08* 1.01*  CALCIUM 7.5* 7.8*  MG 1.5* 1.9   Recent Labs    01/15/18 1541 01/16/18 0720  AST 34 27  ALT 24 20  ALKPHOS 40 30*  BILITOT 0.8 0.9  PROT 6.5 4.6*  ALBUMIN 3.7 2.6*   Recent Labs    01/15/18 1543  01/17/18 0555 01/18/18 0735  WBC 5.1   < > 4.5 7.3  NEUTROABS 3.4  --  2.4  --   HGB 12.1   < > 9.8* 10.3*  HCT 38.6   < > 30.5* 32.3*  MCV 94.8   < > 91.6 93.4  PLT 207   < > PLATELET CLUMPS NOTED ON SMEAR, UNABLE TO ESTIMATE 176   < > = values in this interval not displayed.   No results for input(s): LABPROT, INR in the last 72 hours.    Assessment/Plan: -Persistent diarrhea. ??  Infectious vs olmesartan induced enteropathy versus bile salt diarrhea -Failure to thrive with weight loss -Anemia.  No overt bleeding.  Recommendations ----------------------- -Follow stool studies. -Recommend sigmoidoscopy for further evaluation tomorrow.  discussed with the daughter.  She is in agreement. -If stool studies negative, consider trial of Colestid.   -Discussed with primary team.Olmesartan currently on hold.  GI  will follow.   Kathi Der MD, FACP 01/18/2018, 11:47 AM  Contact #  [phone]

## 2018-01-18 NOTE — Progress Notes (Signed)
Physical Therapy Treatment Patient Details Name: Adrienne Gonzalez MRN: [id number] DOB: 08/03/1925 Today's Date: 01/18/2018    History of Present Illness 85 y.o. female with medical history significant of type 2 diabetes, hypertension admitted with nausea vomiting and diarrhea.  [facility] and discharged 2 days ago for the same complaints.      PT Comments    Pt progressing toward goals; incr tolerance to gait/activity today; will continue to follow in acute setting   Follow Up Recommendations  Home health PT;Supervision/Assistance - 24 hour     Equipment Recommendations  Rolling walker with 5" wheels    Recommendations for Other Services       Precautions / Restrictions Precautions Precautions: Fall Precaution Comments: flexiseal  Restrictions Weight Bearing Restrictions: No    Mobility  Bed Mobility Overal bed mobility: Needs Assistance Bed Mobility: Supine to Sit     Supine to sit: Min assist     General bed mobility comments: light assist with LEs, incr time needed  Transfers Overall transfer level: Needs assistance Equipment used: Rolling walker (2 wheeled) Transfers: Sit to/from Stand Sit to Stand: Min guard         General transfer comment: cues for hand placement and overall safety  Ambulation/Gait Ambulation/Gait assistance: Min assist;Min guard Gait Distance (Feet): 120 Feet Assistive device: Rolling walker (2 wheeled) Gait Pattern/deviations: Step-to pattern;Step-through pattern;Decreased stride length;Wide base of support     General Gait Details: cues for RW position and safety, intermittent assist for maneuvering RW, unsteady but no overt LOB   Stairs             Wheelchair Mobility    Modified Rankin (Stroke Patients Only)       Balance   Sitting-balance support: No upper extremity supported;Feet supported Sitting balance-Leahy Scale: Fair     Standing balance support: Bilateral upper extremity  supported;During functional activity Standing balance-Leahy Scale: Fair Standing balance comment: pt is able to stand statically without support, requires UE support for dynamic activity                            Cognition Arousal/Alertness: Awake/alert Behavior During Therapy: WFL for tasks assessed/performed Overall Cognitive Status: History of cognitive impairments - at baseline                                 General Comments: Pt oriented to self and place, pleasant and cooperative, follows commands,  decr STM      Exercises      General Comments        Pertinent Vitals/Pain Pain Assessment: No/denies pain    Home Living                      Prior Function            PT Goals (current goals can now be found in the care plan section) Acute Rehab PT Goals Patient Stated Goal: to go home PT Goal Formulation: With patient Time For Goal Achievement: 01/30/18 Potential to Achieve Goals: Good Progress towards PT goals: Progressing toward goals    Frequency    Min 3X/week      PT Plan Current plan remains appropriate    Co-evaluation              AM-PAC PT "6 Clicks" Mobility   Outcome Measure  Help needed  turning from your back to your side while in a flat bed without using bedrails?: A Little Help needed moving from lying on your back to sitting on the side of a flat bed without using bedrails?: A Little Help needed moving to and from a bed to a chair (including a wheelchair)?: A Little Help needed standing up from a chair using your arms (e.g., wheelchair or bedside chair)?: A Little Help needed to walk in hospital room?: A Little Help needed climbing 3-5 steps with a railing? : A Lot 6 Click Score: 17    End of Session   Activity Tolerance: Patient tolerated treatment well Patient left: in chair;with call bell/phone within reach(bed alarm not on with PT arrival)   PT Visit Diagnosis: Unsteadiness on feet  (R26.81);Muscle weakness (generalized) (M62.81)     Time: 1100-1120 PT Time Calculation (min) (ACUTE ONLY): 20 min  Charges:  $Gait Training: 8-22 mins                     Drucilla Chaletara Babatunde Seago, PT  Pager: [phone] Acute Rehab Dept [facility]): [phone]   01/18/2018    [facility]ILL[facility]AMS,Laden Fieldhouse 01/18/2018, 11:27 AM

## 2018-01-18 NOTE — H&P (View-Only) (Signed)
[facility] Progress Note  Adrienne Gonzalez 85 y.o. 10/28/1925  CC: Diarrhea, weight loss, failure to thrive   Subjective: Continues to have loose stools.  Has abdominal pain.  Had 2 episodes of vomiting this morning.  Denies blood in the stool.  ROS : Negative for chest pain and shortness of breath.   Objective: Vital signs in last 24 hours: Vitals:   01/17/18 2050 01/18/18 0430  BP: (!) 158/70 (!) 159/77  Pulse: 78 81  Resp: 18 18  Temp: 98.6 F (37 C) 99.2 F (37.3 C)  SpO2: 100% 100%    Physical Exam:  General.  Elderly patient not in acute distress Heart : RRR ABD : Soft, nontender, nondistended, bowel sounds present. Neuro : A/O x 3,  Psych : Mood and affect normal.  Lab Results: Recent Labs    01/17/18 0555 01/18/18 0735  NA 138 137  K 4.7 4.2  CL 118* 114*  CO2 15* 17*  GLUCOSE 187* 152*  BUN 6* 8  CREATININE 1.08* 1.01*  CALCIUM 7.5* 7.8*  MG 1.5* 1.9   Recent Labs    01/15/18 1541 01/16/18 0720  AST 34 27  ALT 24 20  ALKPHOS 40 30*  BILITOT 0.8 0.9  PROT 6.5 4.6*  ALBUMIN 3.7 2.6*   Recent Labs    01/15/18 1543  01/17/18 0555 01/18/18 0735  WBC 5.1   < > 4.5 7.3  NEUTROABS 3.4  --  2.4  --   HGB 12.1   < > 9.8* 10.3*  HCT 38.6   < > 30.5* 32.3*  MCV 94.8   < > 91.6 93.4  PLT 207   < > PLATELET CLUMPS NOTED ON SMEAR, UNABLE TO ESTIMATE 176   < > = values in this interval not displayed.   No results for input(s): LABPROT, INR in the last 72 hours.    Assessment/Plan: -Persistent diarrhea. ??  Infectious vs olmesartan induced enteropathy versus bile salt diarrhea -Failure to thrive with weight loss -Anemia.  No overt bleeding.  Recommendations ----------------------- -Follow stool studies. -Recommend sigmoidoscopy for further evaluation tomorrow.  discussed with the daughter.  She is in agreement. -If stool studies negative, consider trial of Colestid.   -Discussed with primary team.Olmesartan currently on hold.  GI  will follow.   Gilmer Kaminsky MD, FACP 01/18/2018, 11:47 AM  Contact #  [phone] 

## 2018-01-19 ENCOUNTER — Encounter (HOSPITAL_COMMUNITY): Payer: Self-pay | Admitting: *Deleted

## 2018-01-19 ENCOUNTER — Encounter (HOSPITAL_COMMUNITY)
Admission: EM | Disposition: A | Discharge status: Home Health | Payer: Self-pay | Source: Home / Self Care | Attending: Internal Medicine

## 2018-01-19 ENCOUNTER — Inpatient Hospital Stay (HOSPITAL_COMMUNITY): Payer: Medicare Other

## 2018-01-19 DIAGNOSIS — I129 Hypertensive chronic kidney disease with stage 1 through stage 4 chronic kidney disease, or unspecified chronic kidney disease: Secondary | ICD-10-CM | POA: Diagnosis present

## 2018-01-19 DIAGNOSIS — N183 Chronic kidney disease, stage 3 (moderate): Secondary | ICD-10-CM | POA: Diagnosis present

## 2018-01-19 DIAGNOSIS — Z888 Allergy status to other drugs, medicaments and biological substances status: Secondary | ICD-10-CM | POA: Diagnosis not present

## 2018-01-19 DIAGNOSIS — E785 Hyperlipidemia, unspecified: Secondary | ICD-10-CM | POA: Diagnosis present

## 2018-01-19 DIAGNOSIS — R627 Adult failure to thrive: Secondary | ICD-10-CM | POA: Diagnosis present

## 2018-01-19 DIAGNOSIS — N179 Acute kidney failure, unspecified: Secondary | ICD-10-CM | POA: Diagnosis present

## 2018-01-19 DIAGNOSIS — E11649 Type 2 diabetes mellitus with hypoglycemia without coma: Secondary | ICD-10-CM | POA: Diagnosis present

## 2018-01-19 DIAGNOSIS — K648 Other hemorrhoids: Secondary | ICD-10-CM | POA: Diagnosis present

## 2018-01-19 DIAGNOSIS — R197 Diarrhea, unspecified: Secondary | ICD-10-CM | POA: Diagnosis not present

## 2018-01-19 DIAGNOSIS — K5730 Diverticulosis of large intestine without perforation or abscess without bleeding: Secondary | ICD-10-CM | POA: Diagnosis present

## 2018-01-19 DIAGNOSIS — K626 Ulcer of anus and rectum: Secondary | ICD-10-CM | POA: Diagnosis present

## 2018-01-19 DIAGNOSIS — Z8719 Personal history of other diseases of the digestive system: Secondary | ICD-10-CM | POA: Diagnosis not present

## 2018-01-19 DIAGNOSIS — E162 Hypoglycemia, unspecified: Secondary | ICD-10-CM | POA: Diagnosis present

## 2018-01-19 DIAGNOSIS — Z9049 Acquired absence of other specified parts of digestive tract: Secondary | ICD-10-CM | POA: Diagnosis not present

## 2018-01-19 DIAGNOSIS — Z885 Allergy status to narcotic agent status: Secondary | ICD-10-CM | POA: Diagnosis not present

## 2018-01-19 DIAGNOSIS — E876 Hypokalemia: Secondary | ICD-10-CM | POA: Diagnosis present

## 2018-01-19 DIAGNOSIS — Z6825 Body mass index (BMI) 25.0-25.9, adult: Secondary | ICD-10-CM | POA: Diagnosis not present

## 2018-01-19 DIAGNOSIS — D638 Anemia in other chronic diseases classified elsewhere: Secondary | ICD-10-CM | POA: Diagnosis present

## 2018-01-19 DIAGNOSIS — Z7982 Long term (current) use of aspirin: Secondary | ICD-10-CM | POA: Diagnosis not present

## 2018-01-19 DIAGNOSIS — E1122 Type 2 diabetes mellitus with diabetic chronic kidney disease: Secondary | ICD-10-CM | POA: Diagnosis present

## 2018-01-19 DIAGNOSIS — E1143 Type 2 diabetes mellitus with diabetic autonomic (poly)neuropathy: Secondary | ICD-10-CM | POA: Diagnosis present

## 2018-01-19 DIAGNOSIS — B370 Candidal stomatitis: Secondary | ICD-10-CM | POA: Diagnosis not present

## 2018-01-19 DIAGNOSIS — R112 Nausea with vomiting, unspecified: Secondary | ICD-10-CM

## 2018-01-19 HISTORY — PX: FLEXIBLE SIGMOIDOSCOPY: SHX5431

## 2018-01-19 HISTORY — PX: BIOPSY: SHX5522

## 2018-01-19 LAB — BASIC METABOLIC PANEL
Anion gap: 6 (ref 5–15)
BUN: 11 mg/dL (ref 8–23)
CO2: 22 mmol/L (ref 22–32)
Calcium: 7.9 mg/dL — ABNORMAL LOW (ref 8.9–10.3)
Chloride: 107 mmol/L (ref 98–111)
Creatinine, Ser: 0.96 mg/dL (ref 0.44–1.00)
GFR calc Af Amer: >60 mL/min (ref >60)
GFR calc non Af Amer: 52 mL/min — ABNORMAL LOW (ref >60)
Glucose, Bld: 151 mg/dL — ABNORMAL HIGH (ref 70–99)
Potassium: 4 mmol/L (ref 3.5–5.1)
SODIUM: 135 mmol/L (ref 135–145)

## 2018-01-19 LAB — MAGNESIUM: MAGNESIUM: 1.7 mg/dL (ref 1.7–2.4)

## 2018-01-19 LAB — GLUCOSE, CAPILLARY
Glucose-Capillary: 137 mg/dL — ABNORMAL HIGH (ref 70–99)
Glucose-Capillary: 141 mg/dL — ABNORMAL HIGH (ref 70–99)
Glucose-Capillary: 167 mg/dL — ABNORMAL HIGH (ref 70–99)

## 2018-01-19 SURGERY — SIGMOIDOSCOPY, FLEXIBLE

## 2018-01-19 MED ORDER — MAGNESIUM SULFATE 2 GM/50ML IV SOLN
2.0000 g | Freq: Once | INTRAVENOUS | Status: AC
  Administered 2018-01-19: 2 g via INTRAVENOUS
  Filled 2018-01-19: qty 50

## 2018-01-19 MED ORDER — IRBESARTAN 75 MG PO TABS
75.0000 mg | ORAL_TABLET | Freq: Every day | ORAL | Status: DC
  Administered 2018-01-20: 75 mg via ORAL
  Filled 2018-01-19: qty 1

## 2018-01-19 NOTE — Op Note (Signed)
[facility] Patient Name: Adrienne Gonzalez Procedure Date: 01/19/2018 MRN: [id number] Attending MD: Kathi Der , MD Date of Birth: 1925-04-21 CSN: [id number] Age: 85 Admit Type: Inpatient Procedure:                Flexible Sigmoidoscopy Indications:              Clinically significant diarrhea of unexplained                            origin Providers:                Kathi Der, MD, Dwain Sarna, RN, Beryle Beams, Technician Referring MD:              Medicines:                None Complications:            No immediate complications. Estimated Blood Loss:     Estimated blood loss was minimal. Procedure:                Pre-Anesthesia Assessment:                           - Prior to the procedure, a History and Physical                            was performed, and patient medications and                            allergies were reviewed. The patient's tolerance of                            previous anesthesia was also reviewed. The risks                            and benefits of the procedure and the sedation                            options and risks were discussed with the patient.                            All questions were answered, and informed consent                            was obtained. Prior Anticoagulants: The patient has                            taken no previous anticoagulant or antiplatelet                            agents. After reviewing the risks and benefits, the                            patient was deemed in satisfactory condition to  undergo the procedure.                           After obtaining informed consent, the scope was                            passed under direct vision. The GIF-[id number] ([id number]                            Olympus gastroscope was introduced through the anus                            and advanced to the 30 cm from the anal verge. The               flexible sigmoidoscopy was accomplished without                            difficulty. The patient tolerated the procedure                            well. The quality of the bowel preparation was poor. Scope In: 11:55:21 AM Scope Out: 12:05:35 PM Total Procedure Duration: 0 hours 10 minutes 14 seconds  Findings:      The perianal exam findings include hemorrhoids.      A single (solitary) eight mm ulcer was found in the distal rectum. No       bleeding was present.      Normal mucosa was found in the sigmoid colon. Biopsies for histology       were taken with a cold forceps for evaluation of microscopic colitis.      Non-bleeding internal hemorrhoids were found during retroflexion. The       hemorrhoids were small. Impression:               - Preparation of the colon was poor.                           - Hemorrhoids found on perianal exam.                           - A single (solitary) ulcer in the distal rectum.                           - Normal mucosa in the sigmoid colon. Biopsied.                           - Non-bleeding internal hemorrhoids. Moderate Sedation:      Not Applicable Recommendation:           - Return patient to hospital [facility] for ongoing care.                           - Resume previous diet.                           - Continue present medications.                           -  Await pathology results. Procedure Code(s):        --- Professional ---                           [phone], Sigmoidoscopy, flexible; with biopsy, single                            or multiple Diagnosis Code(s):        --- Professional ---                           K64.8, Other hemorrhoids                           K62.6, Ulcer of anus and rectum                           R19.7, Diarrhea, unspecified CPT copyright 2018 American Medical Association. All rights reserved. The codes documented in this report are preliminary and upon coder review may  be revised to meet current  compliance requirements. Kathi Der, MD Kathi Der, MD 01/19/2018 12:20:23 PM Number of Addenda: 0

## 2018-01-19 NOTE — Progress Notes (Addendum)
PROGRESS NOTE  Adrienne Gonzalez ZOX:[id number] DOB: [birth year] DOA: 01/15/2018 PCP: Laurann MontanaWhite, Cynthia, MD  HPI/Recap of past 24 hours:  NPO, awaiting for sigmoidoscopy  Denies pain, no fever Diarrhea has improved, but remain feeling nauseous, no appetite, daughter reports patient vomited yesterday after trying to eat an orange    Family at bedside  Assessment/Plan: Active Problems:   Hypoglycemia   FTT (failure to thrive) in adult   Hypokalemia   CKD (chronic kidney disease), stage III (HCC)  Watery diarrhea since December with weight loss and progressive weakness --[facility] the [facility] from 1/2 to 1/6ct ab and gi pcr panel was negative for acute findings -family report she used to be on valsartan but changed to olmesartan 30days ago, family reports diarrhea started after that, olmesartan induced colitis is on differential -am cortisol level unremarkable -cdiff negative, denies ab pain, no fever, no leukocytosis, denies recent abx use -History of cholecystectomy evidenced by recent CT abdomen however patient could not remember she had history of gallbladder surgery, started on bile salt sequestrant to slow down diarrhea  per gi recommendation -Flexible Sigmoidoscopy today, [facility]  input appreciated, will follow recommendation   Addendum: Flexible Sigmoidoscopy : A single (solitary) ulcer in the distal rectum. Non-bleeding internal hemorrhoids. Normal mucosa in the sigmoid colon. Biopsied.  N/V/poor oral intake, denies ab pain, recent ct ab no acute findings However, she still does not want to eat, case discussed with GI Dr B who recommends kub which is ordered, please follow up with KUB result.  Hypoglycemia: -With h/o insulin dependent diabetes, has been off insulin for at least 2weeks due to poor oral intake and frequent hypoglycemia, family reports her blood glucose drop into the 20's at home a1c 6.4 -She is started on d5ns, am blood glucose now  elevated, d/c d5ns, change to LR, now she is off ivf, encourage oral intake  Hypokalemia/hypomagnesemia : Replace k/ mag , keep K>4, mag >2  AKI on CKDII Bun/cr on presentation 16/1.5 Improving after hydration, Bun/cr 11/0.96 on 1/14 , now off hydration, encourage oral intake Renal dosing meds  Anemia of chronic disease: hgb around 10, no sign of bleeding  HTN:  Discontinue olmesartan Currently on avapro, bp stable  HLD:  continue statin  FTT: prior to diarrhea , patient is highly functional , exercise on stationary bike 45 minutes 3 times a week, able to go ups 18 stairs Since started having diarrhea, she is getting weaker ,now needing physical therapy Will need home health  Code Status: full  Family Communication: patient and family at bedside  Disposition Plan: home with home health, pending clinical improvement , need to be able to eat enough, needs GI clearance    Consultants:  [facility]  Procedures:  Flex sig on 1/14  Antibiotics:  none   Objective: BP (!) 151/77 (BP Location: Right Arm)   Pulse 72   Temp 98.7 F (37.1 C)   Resp 16   Ht 5' (1.524 m)   Wt 59 kg   SpO2 100%   BMI 25.40 kg/m   Intake/Output Summary (Last 24 hours) at 01/19/2018 1028 Last data filed at 01/19/2018 0600 Gross per 24 hour  Intake 769.81 ml  Output 1400 ml  Net -630.19 ml   Filed Weights   01/15/18 1346  Weight: 59 kg    Exam: Patient is examined daily including today on 01/19/2018, exams remain the same as of yesterday except that has changed    General:  NAD, hard of hearing, alert, pleasant, appear younger than stated age  Cardiovascular: RRR  Respiratory: CTABL  Abdomen: Soft/ND/NT, positive BS  Musculoskeletal: No Edema  Neuro: alert, oriented x3  Data Reviewed: Basic Metabolic Panel: Recent Labs  Lab 01/15/18 1541 01/16/18 0720 01/17/18 0555 01/18/18 0735 01/19/18 0927  NA 140 138 138 137 135  K 3.8 3.4* 4.7 4.2 4.0  CL 113* 115* 118* 114*  107  CO2 21* 17* 15* 17* 22  GLUCOSE 114* 191* 187* 152* 151*  BUN 16 9 6* 8 11  CREATININE 1.50* 1.23* 1.08* 1.01* 0.96  CALCIUM 8.6* 7.4* 7.5* 7.8* 7.9*  MG 2.1  --  1.5* 1.9 1.7   Liver Function Tests: Recent Labs  Lab 01/15/18 1541 01/16/18 0720  AST 34 27  ALT 24 20  ALKPHOS 40 30*  BILITOT 0.8 0.9  PROT 6.5 4.6*  ALBUMIN 3.7 2.6*   Recent Labs  Lab 01/15/18 1541  LIPASE 16   No results for input(s): AMMONIA in the last 168 hours. CBC: Recent Labs  Lab 01/15/18 1543 01/16/18 0720 01/17/18 0555 01/18/18 0735  WBC 5.1 4.7 4.5 7.3  NEUTROABS 3.4  --  2.4  --   HGB 12.1 10.0* 9.8* 10.3*  HCT 38.6 31.7* 30.5* 32.3*  MCV 94.8 94.1 91.6 93.4  PLT 207 161 PLATELET CLUMPS NOTED ON SMEAR, UNABLE TO ESTIMATE 176   Cardiac Enzymes:   No results for input(s): CKTOTAL, CKMB, CKMBINDEX, TROPONINI in the last 168 hours. BNP (last 3 results) No results for input(s): BNP in the last 8760 hours.  ProBNP (last 3 results) No results for input(s): PROBNP in the last 8760 hours.  CBG: Recent Labs  Lab 01/18/18 0728 01/18/18 1132 01/18/18 1710 01/18/18 2003 01/19/18 0729  GLUCAP 132* 143* 147* 161* 137*    Recent Results (from the past 240 hour(s))  C Difficile Quick Screen w PCR reflex     Status: None   Collection Time: 01/18/18 10:40 AM  Result Value Ref Range Status   C Diff antigen NEGATIVE NEGATIVE Final   C Diff toxin NEGATIVE NEGATIVE Final   C Diff interpretation No C. difficile detected.  Final    Comment: Performed at [facility], [address] [place], [place] [address]     Studies: No results found.  Scheduled Meds: . aspirin EC  81 mg Oral Daily  . brimonidine  1 drop Both Eyes TID  . cholecalciferol  1,000 Units Oral Daily  . colestipol  1 g Oral BID  . dorzolamide-timolol  1 drop Both Eyes BID  . enoxaparin (LOVENOX) injection  30 mg Subcutaneous Q24H  . feeding supplement (GLUCERNA SHAKE)  237 mL Oral TID BM  .  Gerhardt's butt cream   Topical BID  . irbesartan  37.5 mg Oral Daily  . latanoprost  1 drop Both Eyes QHS  . pravastatin  40 mg Oral Daily  . saccharomyces boulardii  500 mg Oral BID    Continuous Infusions: . sodium chloride 20 mL/hr at 01/19/18 0600     Time spent: , case discussed with GI I have personally reviewed and interpreted on  01/19/2018 daily labs, imagings as discussed above under date review session and assessment and plans.  I reviewed all nursing notes, pharmacy notes, vitals, pertinent old records  I have discussed plan of care as described above with RN , patient  And family on 01/19/2018   Albertine Grates MD, PhD  [facility] Pager [phone]. If 7PM-7AM, please  contact night-coverage at [email or web address], password [facility] 01/19/2018, 10:28 AM  LOS: 0 days

## 2018-01-19 NOTE — Brief Op Note (Signed)
01/15/2018 - 01/19/2018  12:30 PM  PATIENT:  Adrienne Gonzalez  85 y.o. female  PRE-OPERATIVE DIAGNOSIS:  Diarrhea  POST-OPERATIVE DIAGNOSIS:  colon bx  PROCEDURE:  Procedure(s): FLEXIBLE SIGMOIDOSCOPY (N/A) BIOPSY  SURGEON:  Surgeon(s) and Role:    * Kenyata Napier, MD - Primary  Findings --------- -Flexible sigmoidoscopy showed hard stool in the sigmoid colon.  Biopsies taken for microscopic colitis.  Small distal rectal ulcer.  Recommendations ------------------------ -Continue supportive care. -If ongoing diarrhea, consider trial of Colestid at night. -Discussed with Dr. Roda Shutters.  Patient is complaining of chronic nausea.  Recent CT abdomen pelvis without contrast  this month showed no acute changes.  Normal LFTs.  She is status post Collis cystectomy.  Check x-ray abdomen. - If continues to have nausea, consider upper GI series or gastric emptying study. -GI will follow.  Kathi Der MD, FACP 01/19/2018, 12:32 PM  Contact #  [phone]

## 2018-01-19 NOTE — Interval H&P Note (Signed)
History and Physical Interval Note:  01/19/2018 11:35 AM  Adrienne Gonzalez  has presented today for surgery, with the diagnosis of Diarrhea  The various methods of treatment have been discussed with the patient and family. After consideration of risks, benefits and other options for treatment, the patient has consented to  Procedure(s): FLEXIBLE SIGMOIDOSCOPY (N/A) as a surgical intervention .  The patient's history has been reviewed, patient examined, no change in status, stable for surgery.  I have reviewed the patient's chart and labs.  Questions were answered to the patient's satisfaction.    Patient feeling better today.  Denied nausea vomiting to me.  Diarrhea is improving.  Risks (bleeding, infection, bowel perforation that could require surgery, sedation-related changes in cardiopulmonary systems), benefits (identification and possible treatment of source of symptoms, exclusion of certain causes of symptoms), and alternatives (watchful waiting, radiographic imaging studies, empiric medical treatment)  were explained to patient/family in detail and patient wishes to proceed.  Koy Lamp

## 2018-01-20 ENCOUNTER — Encounter (HOSPITAL_COMMUNITY): Payer: Self-pay | Admitting: Gastroenterology

## 2018-01-20 LAB — CBC WITH DIFFERENTIAL/PLATELET
ABS IMMATURE GRANULOCYTES: 0.03 10*3/uL (ref 0.00–0.07)
Basophils Absolute: 0 10*3/uL (ref 0.0–0.1)
Basophils Relative: 1 %
Eosinophils Absolute: 0.2 10*3/uL (ref 0.0–0.5)
Eosinophils Relative: 3 %
HCT: 30.8 % — ABNORMAL LOW (ref 36.0–46.0)
HEMOGLOBIN: 10 g/dL — AB (ref 12.0–15.0)
Immature Granulocytes: 1 %
LYMPHS ABS: 1.8 10*3/uL (ref 0.7–4.0)
Lymphocytes Relative: 28 %
MCH: 30.3 pg (ref 26.0–34.0)
MCHC: 32.5 g/dL (ref 30.0–36.0)
MCV: 93.3 fL (ref 80.0–100.0)
Monocytes Absolute: 0.7 10*3/uL (ref 0.1–1.0)
Monocytes Relative: 12 %
NEUTROS ABS: 3.4 10*3/uL (ref 1.7–7.7)
Neutrophils Relative %: 55 %
Platelets: 163 10*3/uL (ref 150–400)
RBC: 3.3 MIL/uL — ABNORMAL LOW (ref 3.87–5.11)
RDW: 13.3 % (ref 11.5–15.5)
WBC: 6.2 10*3/uL (ref 4.0–10.5)
nRBC: 0 % (ref 0.0–0.2)

## 2018-01-20 LAB — BASIC METABOLIC PANEL
Anion gap: 9 (ref 5–15)
BUN: 15 mg/dL (ref 8–23)
CHLORIDE: 104 mmol/L (ref 98–111)
CO2: 22 mmol/L (ref 22–32)
Calcium: 8 mg/dL — ABNORMAL LOW (ref 8.9–10.3)
Creatinine, Ser: 1.05 mg/dL — ABNORMAL HIGH (ref 0.44–1.00)
GFR calc non Af Amer: 47 mL/min — ABNORMAL LOW (ref >60)
GFR, EST AFRICAN AMERICAN: 54 mL/min — AB (ref >60)
Glucose, Bld: 154 mg/dL — ABNORMAL HIGH (ref 70–99)
Potassium: 3.8 mmol/L (ref 3.5–5.1)
Sodium: 135 mmol/L (ref 135–145)

## 2018-01-20 LAB — GLUCOSE, CAPILLARY
GLUCOSE-CAPILLARY: 135 mg/dL — AB (ref 70–99)
Glucose-Capillary: 213 mg/dL — ABNORMAL HIGH (ref 70–99)
Glucose-Capillary: 184 mg/dL — ABNORMAL HIGH (ref 70–99)
Glucose-Capillary: 159 mg/dL — ABNORMAL HIGH (ref 70–99)

## 2018-01-20 LAB — MAGNESIUM: Magnesium: 2.3 mg/dL (ref 1.7–2.4)

## 2018-01-20 LAB — TSH: TSH: 0.756 u[IU]/mL (ref 0.350–4.500)

## 2018-01-20 MED ORDER — FLUCONAZOLE 100MG IVPB
100.0000 mg | INTRAVENOUS | Status: DC
  Administered 2018-01-20 – 2018-01-24 (×5): 100 mg via INTRAVENOUS
  Filled 2018-01-20 (×5): qty 50

## 2018-01-20 MED ORDER — SODIUM CHLORIDE 0.9 % IV SOLN
INTRAVENOUS | Status: DC
  Administered 2018-01-20 – 2018-01-22 (×3): via INTRAVENOUS

## 2018-01-20 MED ORDER — NYSTATIN 100000 UNIT/ML MT SUSP
5.0000 mL | Freq: Four times a day (QID) | OROMUCOSAL | Status: DC
  Administered 2018-01-20 – 2018-01-25 (×18): 500000 [IU] via ORAL
  Filled 2018-01-20 (×19): qty 5

## 2018-01-20 MED ORDER — BOOST / RESOURCE BREEZE PO LIQD CUSTOM
1.0000 | Freq: Three times a day (TID) | ORAL | Status: DC
  Administered 2018-01-20 – 2018-01-25 (×11): 1 via ORAL

## 2018-01-20 NOTE — Progress Notes (Signed)
Initial Nutrition Assessment  INTERVENTION:   -Continue Boost Breeze po TID, each supplement provides 250 kcal and 9 grams of protein -Provided saltines for pt to snack on  NUTRITION DIAGNOSIS:   Inadequate oral intake related to nausea, vomiting as evidenced by per patient/family report.  GOAL:   Patient will meet greater than or equal to 90% of their needs  MONITOR:   PO intake, Supplement acceptance, Labs, Weight trends, I & O's, Skin  REASON FOR ASSESSMENT:   Malnutrition Screening Tool   ASSESSMENT:   85 y.o. female with medical history significant of type 2 diabetes, hypertension admitted with nausea vomiting and diarrhea.  She was admitted to [facility] 01/07/2018 and discharged 2 days ago for the same complaints.  She has had no fever complains of some abdominal cramps.  1/14: s/p Flexible Sigmoidoscopy   Patient in room with no family at bedside. Pt reports she is still having diarrhea and now N/V. Pt states her diarrhea is improving and she just had an episode of vomiting ~20 minutes prior to RD visit. RD provided pt some saltines to snack on to help with nausea. Pt appreciative. States she has been sipping on [employer] supplements today and likes them. Will continue.   No weight history available in chart. Pt's weight is recorded as the same for 1/2 and 1/14. Pt unable to provide UBW. Per chart review, family had reported pt has lost 10 lb since 1/2.   Medications: Vitamin D tablet daily, Florastor capsule BID Labs reviewed: CBGs: 135-213 GFR:54   NUTRITION - FOCUSED PHYSICAL EXAM:    Most Recent Value  Orbital Region  No depletion  Upper Arm Region  No depletion  Thoracic and Lumbar Region  Unable to assess  Buccal Region  Mild depletion  Temple Region  Mild depletion  Clavicle Bone Region  No depletion  Clavicle and Acromion Bone Region  No depletion  Scapular Bone Region  Unable to assess  Dorsal Hand  No depletion  Patellar Region  No depletion  Anterior  Thigh Region  No depletion  Posterior Calf Region  Mild depletion  Edema (RD Assessment)  None       Diet Order:   Diet Order            Diet NPO time specified  Diet effective midnight        Diet regular Room service appropriate? Yes; Fluid consistency: Thin  Diet effective now              EDUCATION NEEDS:   Education needs have been addressed  Skin:  Skin Assessment: Skin Integrity Issues: Skin Integrity Issues:: Stage I Stage I: coccyx  Last BM:  1/15  Height:   Ht Readings from Last 1 Encounters:  01/19/18 5' (1.524 m)    Weight:   Wt Readings from Last 1 Encounters:  01/19/18 59 kg    Ideal Body Weight:  45.5 kg  BMI:  Body mass index is 25.4 kg/m.  Estimated Nutritional Needs:   Kcal:  1400-1600  Protein:  65-75g  Fluid:  1.6L/day   Tilda Franco, MS, RD, LDN [facility] Inpatient Clinical Dietitian Pager: [phone] After Hours Pager: [phone]

## 2018-01-20 NOTE — Progress Notes (Addendum)
[facility] Progress Note  Adrienne Gonzalez 85 y.o. [birth year]  CC: Diarrhea, weight loss, failure to thrive   Subjective: Patient's diarrhea is improving but now she is complaining of nausea and vomiting.  Denies any blood in the stool or blood in the vomiting.  Complaining of decreased appetite as well.  ROS : Negative for chest pain and shortness of breath.   Objective: Vital signs in last 24 hours: Vitals:   01/19/18 2030 01/20/18 0630  BP: (!) 157/72 131/69  Pulse: 69 68  Resp: 16 16  Temp: 98.6 F (37 C) 97.7 F (36.5 C)  SpO2: 100% 100%    Physical Exam:  General.  Elderly patient not in acute distress Heart : RRR ABD : Soft, nontender, nondistended, bowel sounds present. Neuro : A/O x 3,  Psych : Mood and affect normal.  Lab Results: Recent Labs    01/19/18 0927 01/20/18 0550  NA 135 135  K 4.0 3.8  CL 107 104  CO2 22 22  GLUCOSE 151* 154*  BUN 11 15  CREATININE 0.96 1.05*  CALCIUM 7.9* 8.0*  MG 1.7 2.3   No results for input(s): AST, ALT, ALKPHOS, BILITOT, PROT, ALBUMIN in the last 72 hours. Recent Labs    01/18/18 0735 01/20/18 0550  WBC 7.3 6.2  NEUTROABS  --  3.4  HGB 10.3* 10.0*  HCT 32.3* 30.8*  MCV 93.4 93.3  PLT 176 163   No results for input(s): LABPROT, INR in the last 72 hours.    Assessment/Plan: --Failure to thrive with weight loss, nausea and vomiting.  Recent CT scan showed no acute changes.  She is status post cholecystectomy.  No leukocytosis.  Normal LFTs on January 16, 2018.  Persistent diarrhea. ??  Infectious vs olmesartan induced enteropathy versus bile salt diarrhea. Flexible sigmoidoscopy yesterday showed hard stool in the sigmoid colon and small distal rectal ulcer.  Biopsies negative for microscopic colitis. -C. difficile negative.  -Anemia.  No overt bleeding.  Recommendations ----------------------- -Follow gastric emptying scan.  If negative consider upper GI series for evaluation of nausea and  vomiting. -Continue  Colestid for possible bile salt diarrhea.  -Advance diet as tolerated.  GI will follow.   Kathi Der MD, FACP 01/20/2018, 12:42 PM  Contact #  [phone]

## 2018-01-20 NOTE — Progress Notes (Signed)
Physical Therapy Treatment Patient Details Name: Adrienne Gonzalez MRN: [id number] DOB: 1925/08/31 Today's Date: 01/20/2018    History of Present Illness 85 y.o. female with medical history significant of type 2 diabetes, hypertension admitted with nausea vomiting and diarrhea.  She was admitted to [facility] 01/07/2018 and discharged 2 days ago for the same complaints.      PT Comments    Pt OOB in recliner.  AxO x 2 pleasant.  Assisted with amb to bathroom.  General Gait Details: Amb without walker this session as trial as pt did not use and AD prior to admit.  Noted mild gait instability but no true LOB.  Pt would benefit from a walker in community use.  In home pt prefers furniture, wall, etc.   Follow Up Recommendations  Home health PT;Supervision/Assistance - 24 hour     Equipment Recommendations  Rolling walker with 5" wheels(youth)    Recommendations for Other Services       Precautions / Restrictions Precautions Precautions: Fall Precaution Comments: low vision Restrictions Weight Bearing Restrictions: No    Mobility  Bed Mobility               General bed mobility comments: OOB in recliner   Transfers Overall transfer level: Needs assistance Equipment used: None Transfers: Sit to/from Stand;Stand Pivot Transfers Sit to Stand: Supervision;Min guard Stand pivot transfers: Supervision;Min guard       General transfer comment: cues for hand placement and overall safety due to low vision   Ambulation/Gait Ambulation/Gait assistance: Min guard;Min assist Gait Distance (Feet): 130 Feet Assistive device: None   Gait velocity: decreased    General Gait Details: Amb without walker this session as trial as pt did not use and AD prior to admit.  Noted mild gait instability but no true LOB.  Pt would benefit from a walker in community use.  In home pt prefers furniture, wall, etc.   Stairs             Wheelchair Mobility    Modified Rankin (Stroke Patients  Only)       Balance                                            Cognition Arousal/Alertness: Awake/alert Behavior During Therapy: WFL for tasks assessed/performed Overall Cognitive Status: Within Functional Limits for tasks assessed                                 General Comments: Pt oriented to self and place, pleasant and cooperative, follows commands,  decr STM      Exercises      General Comments        Pertinent Vitals/Pain Pain Assessment: No/denies pain    Home Living                      Prior Function            PT Goals (current goals can now be found in the care plan section) Progress towards PT goals: Progressing toward goals    Frequency    Min 3X/week      PT Plan Current plan remains appropriate    Co-evaluation              AM-PAC PT "6 Clicks" Mobility   Outcome  Measure  Help needed turning from your back to your side while in a flat bed without using bedrails?: A Little Help needed moving from lying on your back to sitting on the side of a flat bed without using bedrails?: A Little Help needed moving to and from a bed to a chair (including a wheelchair)?: A Little Help needed standing up from a chair using your arms (e.g., wheelchair or bedside chair)?: A Little Help needed to walk in hospital room?: A Little Help needed climbing 3-5 steps with a railing? : A Little 6 Click Score: 18    End of Session Equipment Utilized During Treatment: Gait belt Activity Tolerance: Patient tolerated treatment well Patient left: in chair;with call bell/phone within reach;with chair alarm set Nurse Communication: (pt needs a youth RW for home) PT Visit Diagnosis: Unsteadiness on feet (R26.81);Muscle weakness (generalized) (M62.81)     Time: 5329-9242 PT Time Calculation (min) (ACUTE ONLY): 27 min  Charges:  $Gait Training: 8-22 mins $Therapeutic Activity: 8-22 mins                     Felecia Shelling  PTA Acute  Rehabilitation Services Pager      [phone] Office      [phone]

## 2018-01-20 NOTE — Progress Notes (Addendum)
PROGRESS NOTE    Adrienne Gonzalez  [id number] DOB: 1925/07/08 DOA: 01/15/2018 PCP: Laurann MontanaWhite, Cynthia, MD    Brief Narrative: 85 year old with past medical history significant for diabetes, hypertension who was admitted with recurrent nausea vomiting and diarrhea.  She was recently discharged from [facility] prior to this admission for the same complaint.  She had a previous CT that showed diverticulosis without diverticulitis she was discharged on glimepiride.  She presented with low blood sugar.    Assessment & Plan:   Active Problems:   Hypoglycemia   FTT (failure to thrive) in adult   Hypokalemia   CKD (chronic kidney disease), stage III (HCC)   N&V (nausea and vomiting)   Diarrhea, progressive weakness: C. difficile was negative. Cortisol level unremarkable. She was previously on valsartan and subsequently [facility], concern for omelsartan colitis.  She underwent flexible sigmoidoscopy which showed hard stool in the sigmoid colon and a small rectal ulcer. GI is recommending to start colestipol.  Change Glucerna to breeze/ in case of some lactose intolerance Resume IV fluids   Nausea vomiting; She appears to have oral thrush. I will start nystatin and IV Diflucan We will get gastric emptying study GI following. KUB was negative for obstruction.   Oral thrush; starting, IV Diflucan.   Hypokalemia, hypomagnesemia replete.  AKI on chronic kidney disease a stage II; Improving.  Will resume IV fluids due to diarrhea.  Anemia of chronic disease hemoglobin is stable around 10.  Hypertension On Avapro will consider stop Avapro.  Hyperlipidemia; On a statin  FTT: Prior to diarrhea patient was very active she used to exercise on a stationary bike for 45 minutes 3 times a week.  Since she has been having the problem with the diarrhea she is been weak.  She will need PT she will need home health.   Nutrition Problem: Inadequate oral intake Etiology: nausea,  vomiting    Signs/Symptoms: per patient/family report    Interventions: Boost Breeze  Estimated body mass index is 25.4 kg/m as calculated from the following:   Height as of this encounter: 5' (1.524 m).   Weight as of this encounter: 59 kg.   DVT prophylaxis: Lovenox Code Status: Full code Family Communication: Daughter at bedside Disposition Plan: Remain in the hospital for further evaluation of nausea, poor oral intake.  Consultants:   GI   Procedures:  Sigmoidoscopy; show stool in the sigmoid colon, and a small distal rectal ulcer   Antimicrobials:  None   Subjective: Patient still complaining of nausea, she tried to eat She has really poor oral intake. She ate  2 bites of her breakfast She has white plaque in her tongue  Objective: Vitals:   01/19/18 2030 01/20/18 0630 01/20/18 1417 01/20/18 1417  BP: (!) 157/72 131/69  125/67  Pulse: 69 68  65  Resp: 16 16  16   Temp: 98.6 F (37 C) 97.7 F (36.5 C) 97.6 F (36.4 C)   TempSrc:   Oral   SpO2: 100% 100%  100%  Weight:      Height:        Intake/Output Summary (Last 24 hours) at 01/20/2018 1720 Last data filed at 01/20/2018 1400 Gross per 24 hour  Intake 530 ml  Output 550 ml  Net -20 ml   Filed Weights   01/15/18 1346 01/19/18 1100  Weight: 59 kg 59 kg    Examination:  General exam: Appears calm and comfortable  Respiratory system: Clear to auscultation. Respiratory effort normal. Cardiovascular system:  S1 & S2 heard, RRR. No JVD, murmurs, rubs, gallops or clicks. No pedal edema. Gastrointestinal system: Abdomen is soft, nt Central nervous system: Alert and oriented. No focal neurological deficits. Extremities: Symmetric 5 x 5 power. Skin: No rashes, lesions or ulcers   Data Reviewed: I have personally reviewed following labs and imaging studies  CBC: Recent Labs  Lab 01/15/18 1543 01/16/18 0720 01/17/18 0555 01/18/18 0735 01/20/18 0550  WBC 5.1 4.7 4.5 7.3 6.2  NEUTROABS 3.4   --  2.4  --  3.4  HGB 12.1 10.0* 9.8* 10.3* 10.0*  HCT 38.6 31.7* 30.5* 32.3* 30.8*  MCV 94.8 94.1 91.6 93.4 93.3  PLT 207 161 PLATELET CLUMPS NOTED ON SMEAR, UNABLE TO ESTIMATE 176 163   Basic Metabolic Panel: Recent Labs  Lab 01/15/18 1541 01/16/18 0720 01/17/18 0555 01/18/18 0735 01/19/18 0927 01/20/18 0550  NA 140 138 138 137 135 135  K 3.8 3.4* 4.7 4.2 4.0 3.8  CL 113* 115* 118* 114* 107 104  CO2 21* 17* 15* 17* 22 22  GLUCOSE 114* 191* 187* 152* 151* 154*  BUN 16 9 6* 8 11 15   CREATININE 1.50* 1.23* 1.08* 1.01* 0.96 1.05*  CALCIUM 8.6* 7.4* 7.5* 7.8* 7.9* 8.0*  MG 2.1  --  1.5* 1.9 1.7 2.3   GFR: Estimated Creatinine Clearance: 28.6 mL/min (A) (by C-G formula based on SCr of 1.05 mg/dL (H)). Liver Function Tests: Recent Labs  Lab 01/15/18 1541 01/16/18 0720  AST 34 27  ALT 24 20  ALKPHOS 40 30*  BILITOT 0.8 0.9  PROT 6.5 4.6*  ALBUMIN 3.7 2.6*   Recent Labs  Lab 01/15/18 1541  LIPASE 16   No results for input(s): AMMONIA in the last 168 hours. Coagulation Profile: No results for input(s): INR, PROTIME in the last 168 hours. Cardiac Enzymes: No results for input(s): CKTOTAL, CKMB, CKMBINDEX, TROPONINI in the last 168 hours. BNP (last 3 results) No results for input(s): PROBNP in the last 8760 hours. HbA1C: No results for input(s): HGBA1C in the last 72 hours. CBG: Recent Labs  Lab 01/19/18 1703 01/19/18 2035 01/20/18 0741 01/20/18 1118 01/20/18 1629  GLUCAP 141* 167* 135* 213* 184*   Lipid Profile: No results for input(s): CHOL, HDL, LDLCALC, TRIG, CHOLHDL, LDLDIRECT in the last 72 hours. Thyroid Function Tests: Recent Labs    01/20/18 0550  TSH 0.756   Anemia Panel: No results for input(s): VITAMINB12, FOLATE, FERRITIN, TIBC, IRON, RETICCTPCT in the last 72 hours. Sepsis Labs: No results for input(s): PROCALCITON, LATICACIDVEN in the last 168 hours.  Recent Results (from the past 240 hour(s))  C Difficile Quick Screen w PCR reflex      Status: None   Collection Time: 01/18/18 10:40 AM  Result Value Ref Range Status   C Diff antigen NEGATIVE NEGATIVE Final   C Diff toxin NEGATIVE NEGATIVE Final   C Diff interpretation No C. difficile detected.  Final    Comment: Performed at [facility], [address], [place], [place] [address]         Radiology Studies: Dg Abd 1 View  Result Date: 01/19/2018 CLINICAL DATA:  Weight loss and failure to thrive EXAM: ABDOMEN - 1 VIEW COMPARISON:  01/08/2018 FINDINGS: Scattered large and small bowel gas without obstructive change. No free air is noted. No abnormal mass or abnormal calcifications are seen. Degenerative changes of lumbar spine are noted. IMPRESSION: No acute abnormality seen. Electronically Signed   By: Alcide Clever M.D.   On: 01/19/2018  19:01        Scheduled Meds: . aspirin EC  81 mg Oral Daily  . brimonidine  1 drop Both Eyes TID  . cholecalciferol  1,000 Units Oral Daily  . colestipol  1 g Oral BID  . dorzolamide-timolol  1 drop Both Eyes BID  . enoxaparin (LOVENOX) injection  30 mg Subcutaneous Q24H  . feeding supplement  1 Container Oral TID BM  . Gerhardt's butt cream   Topical BID  . irbesartan  75 mg Oral Daily  . latanoprost  1 drop Both Eyes QHS  . nystatin  5 mL Oral QID  . pravastatin  40 mg Oral Daily  . saccharomyces boulardii  500 mg Oral BID   Continuous Infusions: . sodium chloride 50 mL/hr at 01/20/18 1015  . fluconazole (DIFLUCAN) IV 100 mg (01/20/18 1111)     LOS: 1 day    Time spent: 35 minutes.     Alba CoryBelkys A Jaire Pinkham, MD [facility]   If 7PM-7AM, please contact night-coverage [email or web address] Password [email or web address] 01/20/2018, 5:20 PM

## 2018-01-21 LAB — CBC
HCT: 33.7 % — ABNORMAL LOW (ref 36.0–46.0)
Hemoglobin: 10.7 g/dL — ABNORMAL LOW (ref 12.0–15.0)
MCH: 29.4 pg (ref 26.0–34.0)
MCHC: 31.8 g/dL (ref 30.0–36.0)
MCV: 92.6 fL (ref 80.0–100.0)
Platelets: 195 10*3/uL (ref 150–400)
RBC: 3.64 MIL/uL — ABNORMAL LOW (ref 3.87–5.11)
RDW: 13.2 % (ref 11.5–15.5)
WBC: 5.1 10*3/uL (ref 4.0–10.5)
nRBC: 0 % (ref 0.0–0.2)

## 2018-01-21 LAB — GLUCOSE, CAPILLARY
GLUCOSE-CAPILLARY: 154 mg/dL — AB (ref 70–99)
Glucose-Capillary: 142 mg/dL — ABNORMAL HIGH (ref 70–99)
Glucose-Capillary: 158 mg/dL — ABNORMAL HIGH (ref 70–99)
Glucose-Capillary: 168 mg/dL — ABNORMAL HIGH (ref 70–99)

## 2018-01-21 LAB — BASIC METABOLIC PANEL
Anion gap: 7 (ref 5–15)
BUN: 9 mg/dL (ref 8–23)
CO2: 22 mmol/L (ref 22–32)
Calcium: 7.9 mg/dL — ABNORMAL LOW (ref 8.9–10.3)
Chloride: 108 mmol/L (ref 98–111)
Creatinine, Ser: 1.06 mg/dL — ABNORMAL HIGH (ref 0.44–1.00)
GFR calc Af Amer: 54 mL/min — ABNORMAL LOW (ref >60)
GFR calc non Af Amer: 46 mL/min — ABNORMAL LOW (ref >60)
Glucose, Bld: 155 mg/dL — ABNORMAL HIGH (ref 70–99)
Potassium: 3.7 mmol/L (ref 3.5–5.1)
Sodium: 137 mmol/L (ref 135–145)

## 2018-01-21 NOTE — Progress Notes (Signed)
PROGRESS NOTE    Adrienne Gonzalez  [id number] DOB: October 13, 1925 DOA: 01/15/2018 PCP: Laurann MontanaWhite, Cynthia, MD    Brief Narrative: 85 year old with past medical history significant for diabetes, hypertension who was admitted with recurrent nausea vomiting and diarrhea.  [facility] was recently [facility] prior to this admission for the same complaint.  She had a previous CT that showed diverticulosis without diverticulitis she was discharged on glimepiride.  She presented with low blood sugar.    Assessment & Plan:   Active Problems:   Hypoglycemia   FTT (failure to thrive) in adult   Hypokalemia   CKD (chronic kidney disease), stage III (HCC)   N&V (nausea and vomiting)   Diarrhea, progressive weakness: C. difficile was negative. Cortisol level unremarkable. She was previously on valsartan and subsequently changed to [facility], concern for omelsartan colitis.  She underwent flexible sigmoidoscopy which showed hard stool in the sigmoid colon and a small rectal ulcer. GI is recommending to start colestipol.  Change Glucerna to breeze/ in case of some lactose intolerance Continue with  IV fluids  improved.   Nausea vomiting; She appears to have oral thrush. I will start nystatin and IV Diflucan We will get gastric emptying study, unable to get done today. Try tomorrow.  GI following. KUB was negative for obstruction. Improved.   Oral thrush; continue with  IV Diflucan.   Hypokalemia, hypomagnesemia replete.  AKI on chronic kidney disease a stage II; Improving.  Will resume IV fluids due to diarrhea.  Anemia of chronic disease hemoglobin is stable around 10.  Hypertension On Avapro will consider stop Avapro.  Hyperlipidemia; On a statin  FTT: Prior to diarrhea patient was very active she used to exercise on a stationary bike for 45 minutes 3 times a week.  Since she has been having the problem with the diarrhea she is been weak.  She will need PT she will need home  health.   Nutrition Problem: Inadequate oral intake Etiology: nausea, vomiting    Signs/Symptoms: per patient/family report    Interventions: Boost Breeze  Estimated body mass index is 25.4 kg/m as calculated from the following:   Height as of this encounter: 5' (1.524 m).   Weight as of this encounter: 59 kg.   DVT prophylaxis: Lovenox Code Status: Full code Family Communication: Daughter at bedside Disposition Plan: Remain in the hospital for further evaluation of nausea, poor oral intake.  Consultants:   GI   Procedures:  Sigmoidoscopy; show stool in the sigmoid colon, and a small distal rectal ulcer   Antimicrobials:  None   Subjective: She is doing ok, has been NPO was test.  Feels nausea has improved. She will try to eat today   Objective: Vitals:   01/20/18 1417 01/20/18 1417 01/20/18 2053 01/21/18 0519  BP:  125/67 (!) 156/77 (!) 155/71  Pulse:  65 68 76  Resp:  16 16 16   Temp: 97.6 F (36.4 C)  98.4 F (36.9 C) 98.3 F (36.8 C)  TempSrc: Oral  Oral Oral  SpO2:  100% 100% 100%  Weight:      Height:        Intake/Output Summary (Last 24 hours) at 01/21/2018 1444 Last data filed at 01/21/2018 0839 Gross per 24 hour  Intake 417.82 ml  Output -  Net 417.82 ml   Filed Weights   01/15/18 1346 01/19/18 1100  Weight: 59 kg 59 kg    Examination:  General exam: NAD Respiratory system: CTA Cardiovascular system: S 1, S  2 RRR Gastrointestinal system: BS present, soft, nt Central nervous system: alert and oriented.  Extremities: symmetric power.    Data Reviewed: I have personally reviewed following labs and imaging studies  CBC: Recent Labs  Lab 01/15/18 1543 01/16/18 0720 01/17/18 0555 01/18/18 0735 01/20/18 0550 01/21/18 0546  WBC 5.1 4.7 4.5 7.3 6.2 5.1  NEUTROABS 3.4  --  2.4  --  3.4  --   HGB 12.1 10.0* 9.8* 10.3* 10.0* 10.7*  HCT 38.6 31.7* 30.5* 32.3* 30.8* 33.7*  MCV 94.8 94.1 91.6 93.4 93.3 92.6  PLT 207 161  PLATELET CLUMPS NOTED ON SMEAR, UNABLE TO ESTIMATE 176 163 195   Basic Metabolic Panel: Recent Labs  Lab 01/15/18 1541  01/17/18 0555 01/18/18 0735 01/19/18 0927 01/20/18 0550 01/21/18 0546  NA 140   < > 138 137 135 135 137  K 3.8   < > 4.7 4.2 4.0 3.8 3.7  CL 113*   < > 118* 114* 107 104 108  CO2 21*   < > 15* 17* 22 22 22   GLUCOSE 114*   < > 187* 152* 151* 154* 155*  BUN 16   < > 6* 8 11 15 9   CREATININE 1.50*   < > 1.08* 1.01* 0.96 1.05* 1.06*  CALCIUM 8.6*   < > 7.5* 7.8* 7.9* 8.0* 7.9*  MG 2.1  --  1.5* 1.9 1.7 2.3  --    < > = values in this interval not displayed.   GFR: Estimated Creatinine Clearance: 28.3 mL/min (A) (by C-G formula based on SCr of 1.06 mg/dL (H)). Liver Function Tests: Recent Labs  Lab 01/15/18 1541 01/16/18 0720  AST 34 27  ALT 24 20  ALKPHOS 40 30*  BILITOT 0.8 0.9  PROT 6.5 4.6*  ALBUMIN 3.7 2.6*   Recent Labs  Lab 01/15/18 1541  LIPASE 16   No results for input(s): AMMONIA in the last 168 hours. Coagulation Profile: No results for input(s): INR, PROTIME in the last 168 hours. Cardiac Enzymes: No results for input(s): CKTOTAL, CKMB, CKMBINDEX, TROPONINI in the last 168 hours. BNP (last 3 results) No results for input(s): PROBNP in the last 8760 hours. HbA1C: No results for input(s): HGBA1C in the last 72 hours. CBG: Recent Labs  Lab 01/20/18 1118 01/20/18 1629 01/20/18 2057 01/21/18 0749 01/21/18 1142  GLUCAP 213* 184* 159* 142* 158*   Lipid Profile: No results for input(s): CHOL, HDL, LDLCALC, TRIG, CHOLHDL, LDLDIRECT in the last 72 hours. Thyroid Function Tests: Recent Labs    01/20/18 0550  TSH 0.756   Anemia Panel: No results for input(s): VITAMINB12, FOLATE, FERRITIN, TIBC, IRON, RETICCTPCT in the last 72 hours. Sepsis Labs: No results for input(s): PROCALCITON, LATICACIDVEN in the last 168 hours.  Recent Results (from the past 240 hour(s))  C Difficile Quick Screen w PCR reflex     Status: None   Collection  Time: 01/18/18 10:40 AM  Result Value Ref Range Status   C Diff antigen NEGATIVE NEGATIVE Final   C Diff toxin NEGATIVE NEGATIVE Final   C Diff interpretation No C. difficile detected.  Final    Comment: Performed at [facility], [address] [place], [place] [address]         Radiology Studies: Dg Abd 1 View  Result Date: 01/19/2018 CLINICAL DATA:  Weight loss and failure to thrive EXAM: ABDOMEN - 1 VIEW COMPARISON:  01/08/2018 FINDINGS: Scattered large and small bowel gas without obstructive change. No free air is noted.  No abnormal mass or abnormal calcifications are seen. Degenerative changes of lumbar spine are noted. IMPRESSION: No acute abnormality seen. Electronically Signed   By: Alcide CleverMark  Lukens M.D.   On: 01/19/2018 19:01        Scheduled Meds: . aspirin EC  81 mg Oral Daily  . brimonidine  1 drop Both Eyes TID  . cholecalciferol  1,000 Units Oral Daily  . colestipol  1 g Oral BID  . dorzolamide-timolol  1 drop Both Eyes BID  . enoxaparin (LOVENOX) injection  30 mg Subcutaneous Q24H  . feeding supplement  1 Container Oral TID BM  . Gerhardt's butt cream   Topical BID  . latanoprost  1 drop Both Eyes QHS  . nystatin  5 mL Oral QID  . pravastatin  40 mg Oral Daily  . saccharomyces boulardii  500 mg Oral BID   Continuous Infusions: . sodium chloride 75 mL/hr at 01/21/18 0033  . fluconazole (DIFLUCAN) IV 100 mg (01/21/18 1017)     LOS: 2 days    Time spent: 35 minutes.     Alba CoryBelkys A Lacresia Darwish, MD [facility]   If 7PM-7AM, please contact night-coverage [email or web address] Password [email or web address] 01/21/2018, 2:44 PM

## 2018-01-21 NOTE — Progress Notes (Signed)
[facility] Progress Note  Adrienne Gonzalez 85 y.o. 04-08-1925  CC: Diarrhea, weight loss, failure to thrive   Subjective: Patient is feeling better now.  Nausea and vomiting improving.  Tolerating diet.  Diarrhea resolved.  Denies abdominal pain  ROS : Negative for chest pain and shortness of breath.   Objective: Vital signs in last 24 hours: Vitals:   01/20/18 2053 01/21/18 0519  BP: (!) 156/77 (!) 155/71  Pulse: 68 76  Resp: 16 16  Temp: 98.4 F (36.9 C) 98.3 F (36.8 C)  SpO2: 100% 100%    Physical Exam:  General.  Elderly patient not in acute distress Heart : RRR ABD : Soft, nontender, nondistended, bowel sounds present. Neuro : A/O x 3,  Psych : Mood and affect normal.  Lab Results: Recent Labs    01/19/18 0927 01/20/18 0550 01/21/18 0546  NA 135 135 137  K 4.0 3.8 3.7  CL 107 104 108  CO2 22 22 22   GLUCOSE 151* 154* 155*  BUN 11 15 9   CREATININE 0.96 1.05* 1.06*  CALCIUM 7.9* 8.0* 7.9*  MG 1.7 2.3  --    No results for input(s): AST, ALT, ALKPHOS, BILITOT, PROT, ALBUMIN in the last 72 hours. Recent Labs    01/20/18 0550 01/21/18 0546  WBC 6.2 5.1  NEUTROABS 3.4  --   HGB 10.0* 10.7*  HCT 30.8* 33.7*  MCV 93.3 92.6  PLT 163 195   No results for input(s): LABPROT, INR in the last 72 hours.    Assessment/Plan: --Failure to thrive with weight loss, nausea and vomiting.  Recent CT scan showed no acute changes.  She is status post cholecystectomy.  No leukocytosis.  Normal LFTs on January 16, 2018.  Persistent diarrhea. ??  Infectious vs olmesartan induced enteropathy versus bile salt diarrhea. Flexible sigmoidoscopy yesterday showed hard stool in the sigmoid colon and small distal rectal ulcer.  Biopsies negative for microscopic colitis. -C. difficile negative.  -Anemia.  No overt bleeding.  Recommendations ----------------------- - Vomiting has improved.  Diarrhea also improved. -Currently on regular diet. -No further GI  recommendation at this point.  She is scheduled for gastric emptying scan tomorrow.  -Continue Colestid for possible bile salt diarrhea.  -GI will sign off.  Call us back if needed   Kathi Der MD, FACP 01/21/2018, 12:58 PM  Contact #  [phone]

## 2018-01-22 ENCOUNTER — Inpatient Hospital Stay (HOSPITAL_COMMUNITY): Payer: Medicare Other

## 2018-01-22 LAB — GLUCOSE, CAPILLARY
Glucose-Capillary: 142 mg/dL — ABNORMAL HIGH (ref 70–99)
Glucose-Capillary: 114 mg/dL — ABNORMAL HIGH (ref 70–99)
Glucose-Capillary: 148 mg/dL — ABNORMAL HIGH (ref 70–99)

## 2018-01-22 MED ORDER — TECHNETIUM TC 99M SULFUR COLLOID
1.9000 | Freq: Once | INTRAVENOUS | Status: AC
  Administered 2018-01-22: 1.9 via INTRAVENOUS

## 2018-01-22 MED ORDER — METOCLOPRAMIDE HCL 5 MG PO TABS
5.0000 mg | ORAL_TABLET | Freq: Three times a day (TID) | ORAL | Status: DC
  Administered 2018-01-22 – 2018-01-25 (×12): 5 mg via ORAL
  Filled 2018-01-22 (×11): qty 1

## 2018-01-22 MED ORDER — AMLODIPINE BESYLATE 5 MG PO TABS
5.0000 mg | ORAL_TABLET | Freq: Every day | ORAL | Status: DC
  Administered 2018-01-22 – 2018-01-25 (×4): 5 mg via ORAL
  Filled 2018-01-22 (×4): qty 1

## 2018-01-22 MED ORDER — HYDRALAZINE HCL 20 MG/ML IJ SOLN
10.0000 mg | Freq: Four times a day (QID) | INTRAMUSCULAR | Status: DC | PRN
  Administered 2018-01-22 – 2018-01-25 (×2): 10 mg via INTRAVENOUS
  Filled 2018-01-22: qty 1

## 2018-01-22 NOTE — Care Management Important Message (Signed)
Important Message  Patient Details  Name: Valma Metzger MRN: [id number] Date of Birth: 01/04/1926   Medicare Important Message Given:  Yes    Jennalynn Rivard 01/22/2018, 9:07 AM

## 2018-01-22 NOTE — Progress Notes (Signed)
PT Cancellation Note  Patient Details Name: Adrienne Gonzalez MRN: [id number] DOB: 10/30/1925   Cancelled Treatment:    Reason Eval/Treat Not Completed: Patient at procedure or test/unavailable. Will check back as schedule allows.    [name], PT Acute Rehabilitation Services Pager: [phone] Office: [phone]

## 2018-01-22 NOTE — Progress Notes (Signed)
PROGRESS NOTE    Adrienne Gonzalez  ZOX:[id number] DOB: [birth year] DOA: 01/15/2018 PCP: Laurann MontanaWhite, Cynthia, MD    Brief Narrative: 85 year old with past medical history significant for diabetes, hypertension who was admitted with recurrent nausea vomiting and diarrhea.  She was recently [facility] prior to this admission for the same complaint.  She had a previous CT that showed diverticulosis without diverticulitis she was discharged on glimepiride.  She presented with low blood sugar.    Assessment & Plan:   Active Problems:   Hypoglycemia   FTT (failure to thrive) in adult   Hypokalemia   CKD (chronic kidney disease), stage III (HCC)   N&V (nausea and vomiting)   Diarrhea, progressive weakness: C. difficile was negative. Cortisol level unremarkable. She was previously on valsartan and subsequently changed to [facility], concern for omelsartan colitis.  She underwent flexible sigmoidoscopy which showed hard stool in the sigmoid colon and a small rectal ulcer. GI is recommending to start colestipol.  Change Glucerna to breeze/ in case of some lactose intolerance  improved.   Nausea vomiting; She appears to have oral thrush. I will start nystatin and IV Diflucan We will get gastric emptying study, unable to get done today. Try tomorrow.  GI following. KUB was negative for obstruction. Improved.  Awaiting esophagogram results.   Oral thrush; continue with  IV Diflucan.   Hypokalemia, hypomagnesemia replete.  AKI on chronic kidney disease a stage II; Improving.  Will resume IV fluids due to diarrhea.  Anemia of chronic disease hemoglobin is stable around 10.  Hypertension Stop avapro.  She was previously on omelsartan which was thought to cause colitis.  SBP 190. Will give hydralazine and will start Norvasc.   Hyperlipidemia; On a statin  FTT: Prior to diarrhea patient was very active she used to exercise on a stationary bike for 45 minutes 3 times a week.   Since she has been having the problem with the diarrhea she is been weak.  She will need PT she will need home health.   Nutrition Problem: Inadequate oral intake Etiology: nausea, vomiting    Signs/Symptoms: per patient/family report    Interventions: Boost Breeze  Estimated body mass index is 25.4 kg/m as calculated from the following:   Height as of this encounter: 5' (1.524 m).   Weight as of this encounter: 59 kg.   DVT prophylaxis: Lovenox Code Status: Full code Family Communication: Daughter at bedside Disposition Plan: Remain in the hospital for further evaluation of nausea, poor oral intake.  Consultants:   GI   Procedures:  Sigmoidoscopy; show stool in the sigmoid colon, and a small distal rectal ulcer   Antimicrobials:  None   Subjective: She is ok, just came from study.  She denies nausea. She has started to eat more.   Objective: Vitals:   01/21/18 0519 01/21/18 2104 01/22/18 0545 01/22/18 1350  BP: (!) 155/71 (!) 156/68 (!) 144/67 (!) 194/84  Pulse: 76 70 69 69  Resp: 16 16 16    Temp: 98.3 F (36.8 C) 98.4 F (36.9 C) 98.6 F (37 C) 97.7 F (36.5 C)  TempSrc: Oral Oral Oral Oral  SpO2: 100% 100% 100% 100%  Weight:      Height:        Intake/Output Summary (Last 24 hours) at 01/22/2018 1404 Last data filed at 01/22/2018 0600 Gross per 24 hour  Intake 1188.67 ml  Output -  Net 1188.67 ml   Filed Weights   01/15/18 1346 01/19/18 1100  Weight:  59 kg 59 kg    Examination:  General exam: NAD Respiratory system: CTA Cardiovascular system: S 1, S 2 RRR Gastrointestinal system: BS present, soft, nt Central nervous system: Alert and oriented.  Extremities: Symmetric power.    Data Reviewed: I have personally reviewed following labs and imaging studies  CBC: Recent Labs  Lab 01/15/18 1543 01/16/18 0720 01/17/18 0555 01/18/18 0735 01/20/18 0550 01/21/18 0546  WBC 5.1 4.7 4.5 7.3 6.2 5.1  NEUTROABS 3.4  --  2.4  --  3.4  --    HGB 12.1 10.0* 9.8* 10.3* 10.0* 10.7*  HCT 38.6 31.7* 30.5* 32.3* 30.8* 33.7*  MCV 94.8 94.1 91.6 93.4 93.3 92.6  PLT 207 161 PLATELET CLUMPS NOTED ON SMEAR, UNABLE TO ESTIMATE 176 163 195   Basic Metabolic Panel: Recent Labs  Lab 01/15/18 1541  01/17/18 0555 01/18/18 0735 01/19/18 0927 01/20/18 0550 01/21/18 0546  NA 140   < > 138 137 135 135 137  K 3.8   < > 4.7 4.2 4.0 3.8 3.7  CL 113*   < > 118* 114* 107 104 108  CO2 21*   < > 15* 17* 22 22 22   GLUCOSE 114*   < > 187* 152* 151* 154* 155*  BUN 16   < > 6* 8 11 15 9   CREATININE 1.50*   < > 1.08* 1.01* 0.96 1.05* 1.06*  CALCIUM 8.6*   < > 7.5* 7.8* 7.9* 8.0* 7.9*  MG 2.1  --  1.5* 1.9 1.7 2.3  --    < > = values in this interval not displayed.   GFR: Estimated Creatinine Clearance: 28.3 mL/min (A) (by C-G formula based on SCr of 1.06 mg/dL (H)). Liver Function Tests: Recent Labs  Lab 01/15/18 1541 01/16/18 0720  AST 34 27  ALT 24 20  ALKPHOS 40 30*  BILITOT 0.8 0.9  PROT 6.5 4.6*  ALBUMIN 3.7 2.6*   Recent Labs  Lab 01/15/18 1541  LIPASE 16   No results for input(s): AMMONIA in the last 168 hours. Coagulation Profile: No results for input(s): INR, PROTIME in the last 168 hours. Cardiac Enzymes: No results for input(s): CKTOTAL, CKMB, CKMBINDEX, TROPONINI in the last 168 hours. BNP (last 3 results) No results for input(s): PROBNP in the last 8760 hours. HbA1C: No results for input(s): HGBA1C in the last 72 hours. CBG: Recent Labs  Lab 01/21/18 0749 01/21/18 1142 01/21/18 1801 01/21/18 2103 01/22/18 0736  GLUCAP 142* 158* 154* 168* 142*   Lipid Profile: No results for input(s): CHOL, HDL, LDLCALC, TRIG, CHOLHDL, LDLDIRECT in the last 72 hours. Thyroid Function Tests: Recent Labs    01/20/18 0550  TSH 0.756   Anemia Panel: No results for input(s): VITAMINB12, FOLATE, FERRITIN, TIBC, IRON, RETICCTPCT in the last 72 hours. Sepsis Labs: No results for input(s): PROCALCITON, LATICACIDVEN in the  last 168 hours.  Recent Results (from the past 240 hour(s))  C Difficile Quick Screen w PCR reflex     Status: None   Collection Time: 01/18/18 10:40 AM  Result Value Ref Range Status   C Diff antigen NEGATIVE NEGATIVE Final   C Diff toxin NEGATIVE NEGATIVE Final   C Diff interpretation No C. difficile detected.  Final    Comment: Performed at [facility], [address], [place], [place] [address]         Radiology Studies: No results found.      Scheduled Meds: . amLODipine  5 mg Oral Daily  . aspirin  EC  81 mg Oral Daily  . brimonidine  1 drop Both Eyes TID  . cholecalciferol  1,000 Units Oral Daily  . colestipol  1 g Oral BID  . dorzolamide-timolol  1 drop Both Eyes BID  . enoxaparin (LOVENOX) injection  30 mg Subcutaneous Q24H  . feeding supplement  1 Container Oral TID BM  . Gerhardt's butt cream   Topical BID  . latanoprost  1 drop Both Eyes QHS  . nystatin  5 mL Oral QID  . pravastatin  40 mg Oral Daily  . saccharomyces boulardii  500 mg Oral BID   Continuous Infusions: . sodium chloride 75 mL/hr at 01/22/18 0328  . fluconazole (DIFLUCAN) IV Stopped (01/22/18 1019)     LOS: 3 days    Time spent: 35 minutes.     Alba Cory, MD [facility]   If 7PM-7AM, please contact night-coverage [email or web address] Password [email or web address] 01/22/2018, 2:04 PM

## 2018-01-22 NOTE — Progress Notes (Signed)
Physical Therapy Treatment Patient Details Name: Adrienne Gonzalez MRN: [id number] DOB: 1925/01/25 Today's Date: 01/22/2018    History of Present Illness 85 y.o. female with medical history significant of type 2 diabetes, hypertension admitted with nausea vomiting and diarrhea.  She was [facility] to [facility] and discharged 2 days ago for the same complaints.      PT Comments    Pt continues to participate well.    Follow Up Recommendations  Home health PT;Supervision/Assistance - 24 hour     Equipment Recommendations  (youth height)    Recommendations for Other Services       Precautions / Restrictions Precautions Precautions: Fall Precaution Comments: low vision Restrictions Weight Bearing Restrictions: No    Mobility  Bed Mobility Overal bed mobility: Needs Assistance Bed Mobility: Supine to Sit;Sit to Supine     Supine to sit: Min assist Sit to supine: Min assist   General bed mobility comments: Assist for scoot to EOB and LEs back onto bed.   Transfers Overall transfer level: Needs assistance Equipment used: Rolling walker (2 wheeled);None Transfers: Sit to/from Stand Sit to Stand: Min assist         General transfer comment: Assist to rise, steady, control descent. VCs safety, hand placement  Ambulation/Gait Ambulation/Gait assistance: Min assist;Min guard Gait Distance (Feet): 100 Feet Assistive device: Rolling walker (2 wheeled);None Gait Pattern/deviations: Decreased stride length     General Gait Details: Min guard with RW use; Min assist without an assistive device. Cues for safety. Slow gait speed.    Stairs             Wheelchair Mobility    Modified Rankin (Stroke Patients Only)       Balance Overall balance assessment: Needs assistance                                          Cognition Arousal/Alertness: Awake/Gonzalez Behavior During Therapy: WFL for tasks assessed/performed Overall Cognitive Status: Within  Functional Limits for tasks assessed                                        Exercises      General Comments        Pertinent Vitals/Pain Pain Assessment: No/denies pain    Home Living                      Prior Function            PT Goals (current goals can now be found in the care plan section) Progress towards PT goals: Progressing toward goals    Frequency    Min 3X/week      PT Plan Current plan remains appropriate    Co-evaluation              AM-PAC PT "6 Clicks" Mobility   Outcome Measure  Help needed turning from your back to your side while in a flat bed without using bedrails?: A Little Help needed moving from lying on your back to sitting on the side of a flat bed without using bedrails?: A Little Help needed moving to and from a bed to a chair (including a wheelchair)?: A Little Help needed standing up from a chair using your arms (e.g., wheelchair or bedside chair)?: A Little  Help needed to walk in hospital room?: A Little Help needed climbing 3-5 steps with a railing? : A Little 6 Click Score: 18    End of Session Equipment Utilized During Treatment: Gait belt Activity Tolerance: Patient tolerated treatment well Patient left: in bed;with call bell/phone within reach;with bed alarm set   PT Visit Diagnosis: Unsteadiness on feet (R26.81);Muscle weakness (generalized) (M62.81)     Time: 1173-5670 PT Time Calculation (min) (ACUTE ONLY): 25 min  Charges:  $Gait Training: 23-37 mins                        Adrienne Gonzalez, PT Acute Rehabilitation Services Pager: [phone] Office: [phone]

## 2018-01-23 LAB — BASIC METABOLIC PANEL
Anion gap: 8 (ref 5–15)
BUN: 11 mg/dL (ref 8–23)
CALCIUM: 8 mg/dL — AB (ref 8.9–10.3)
CO2: 22 mmol/L (ref 22–32)
Chloride: 109 mmol/L (ref 98–111)
Creatinine, Ser: 0.94 mg/dL (ref 0.44–1.00)
GFR calc Af Amer: >60 mL/min (ref >60)
GFR calc non Af Amer: 53 mL/min — ABNORMAL LOW (ref >60)
Glucose, Bld: 144 mg/dL — ABNORMAL HIGH (ref 70–99)
Potassium: 3.5 mmol/L (ref 3.5–5.1)
Sodium: 139 mmol/L (ref 135–145)

## 2018-01-23 LAB — CBC
HCT: 31 % — ABNORMAL LOW (ref 36.0–46.0)
Hemoglobin: 9.8 g/dL — ABNORMAL LOW (ref 12.0–15.0)
MCH: 29.6 pg (ref 26.0–34.0)
MCHC: 31.6 g/dL (ref 30.0–36.0)
MCV: 93.7 fL (ref 80.0–100.0)
Platelets: 202 10*3/uL (ref 150–400)
RBC: 3.31 MIL/uL — AB (ref 3.87–5.11)
RDW: 13.4 % (ref 11.5–15.5)
WBC: 5.7 10*3/uL (ref 4.0–10.5)
nRBC: 0 % (ref 0.0–0.2)

## 2018-01-23 LAB — GLUCOSE, CAPILLARY
Glucose-Capillary: 127 mg/dL — ABNORMAL HIGH (ref 70–99)
Glucose-Capillary: 219 mg/dL — ABNORMAL HIGH (ref 70–99)
Glucose-Capillary: 184 mg/dL — ABNORMAL HIGH (ref 70–99)
Glucose-Capillary: 229 mg/dL — ABNORMAL HIGH (ref 70–99)

## 2018-01-23 MED ORDER — SODIUM CHLORIDE 0.9 % IV SOLN
INTRAVENOUS | Status: DC
  Administered 2018-01-23 – 2018-01-25 (×4): via INTRAVENOUS

## 2018-01-23 NOTE — Progress Notes (Signed)
PROGRESS NOTE    Adrienne Gonzalez  [id number] DOB: 07/24/1925 DOA: 01/15/2018 PCP: Laurann MontanaWhite, Cynthia, MD    Brief Narrative: 85 year old with past medical history significant for diabetes, hypertension who was admitted with recurrent nausea vomiting and diarrhea.  She was [facility] prior to this admission for the same complaint.  She had a previous CT that showed diverticulosis without diverticulitis she was discharged on glimepiride.  She presented with low blood sugar.    Assessment & Plan:   Active Problems:   Hypoglycemia   FTT (failure to thrive) in adult   Hypokalemia   CKD (chronic kidney disease), stage III (HCC)   N&V (nausea and vomiting)   Diarrhea, progressive weakness: C. difficile was negative. Cortisol level unremarkable. She was previously on valsartan and subsequently changed to [facility], concern for omelsartan colitis.  She underwent flexible sigmoidoscopy which showed hard stool in the sigmoid colon and a small rectal ulcer. GI is recommending to start colestipol.  Change Glucerna to breeze/ in case of some lactose intolerance  improved.   Nausea vomiting; She appears to have oral thrush. I will start nystatin and IV Diflucan We will get gastric emptying study, unable to get done today. Try tomorrow.  GI following. KUB was negative for obstruction. Improved.  Esophagogram showed some degree of gastroparesis. Started on reglan.  Minimal intake.   Oral thrush; continue with  IV Diflucan.   Hypokalemia, hypomagnesemia replete.  AKI on chronic kidney disease a stage II; Improving.  Will resume IV fluids due to diarrhea.  Anemia of chronic disease hemoglobin is stable around 10.  Hypertension Stop avapro.  She was previously on omelsartan which was thought to cause colitis.  Continue with norvasc   Hyperlipidemia; On a statin  FTT: Prior to diarrhea patient was very active she used to exercise on a stationary bike for 45  minutes 3 times a week.  Since she has been having the problem with the diarrhea she is been weak.  She will need PT she will need home health.   Nutrition Problem: Inadequate oral intake Etiology: nausea, vomiting    Signs/Symptoms: per patient/family report    Interventions: Boost Breeze  Estimated body mass index is 25.4 kg/m as calculated from the following:   Height as of this encounter: 5' (1.524 m).   Weight as of this encounter: 59 kg.   DVT prophylaxis: Lovenox Code Status: Full code Family Communication: Daughter at bedside Disposition Plan: Remain in the hospital for further evaluation of nausea, poor oral intake.  Consultants:   GI   Procedures:  Sigmoidoscopy; show stool in the sigmoid colon, and a small distal rectal ulcer   Antimicrobials:  None   Subjective: Per family not eating enough  Only ate small bites Objective: Vitals:   01/22/18 1746 01/22/18 2110 01/23/18 0453 01/23/18 1315  BP: (!) 142/64 (!) 118/57 119/61 110/77  Pulse:  76 71 69  Resp:  16 16 16   Temp:  98.2 F (36.8 C) 98.8 F (37.1 C) 98.1 F (36.7 C)  TempSrc:    Oral  SpO2:  100% 100% 100%  Weight:      Height:        Intake/Output Summary (Last 24 hours) at 01/23/2018 1322 Last data filed at 01/23/2018 1000 Gross per 24 hour  Intake 742.74 ml  Output -  Net 742.74 ml   Filed Weights   01/15/18 1346 01/19/18 1100  Weight: 59 kg 59 kg    Examination:  General exam:  NAD Respiratory system: CTA Cardiovascular system: S 1, S 2 RRR Gastrointestinal system: BS present., soft,  Central nervous system: alert and oriented Extremities: Symmetric power.    Data Reviewed: I have personally reviewed following labs and imaging studies  CBC: Recent Labs  Lab 01/17/18 0555 01/18/18 0735 01/20/18 0550 01/21/18 0546 01/23/18 0819  WBC 4.5 7.3 6.2 5.1 5.7  NEUTROABS 2.4  --  3.4  --   --   HGB 9.8* 10.3* 10.0* 10.7* 9.8*  HCT 30.5* 32.3* 30.8* 33.7* 31.0*  MCV  91.6 93.4 93.3 92.6 93.7  PLT PLATELET CLUMPS NOTED ON SMEAR, UNABLE TO ESTIMATE 176 163 195 202   Basic Metabolic Panel: Recent Labs  Lab 01/17/18 0555 01/18/18 0735 01/19/18 0927 01/20/18 0550 01/21/18 0546 01/23/18 0819  NA 138 137 135 135 137 139  K 4.7 4.2 4.0 3.8 3.7 3.5  CL 118* 114* 107 104 108 109  CO2 15* 17* 22 22 22 22   GLUCOSE 187* 152* 151* 154* 155* 144*  BUN 6* 8 11 15 9 11   CREATININE 1.08* 1.01* 0.96 1.05* 1.06* 0.94  CALCIUM 7.5* 7.8* 7.9* 8.0* 7.9* 8.0*  MG 1.5* 1.9 1.7 2.3  --   --    GFR: Estimated Creatinine Clearance: 32 mL/min (by C-G formula based on SCr of 0.94 mg/dL). Liver Function Tests: No results for input(s): AST, ALT, ALKPHOS, BILITOT, PROT, ALBUMIN in the last 168 hours. No results for input(s): LIPASE, AMYLASE in the last 168 hours. No results for input(s): AMMONIA in the last 168 hours. Coagulation Profile: No results for input(s): INR, PROTIME in the last 168 hours. Cardiac Enzymes: No results for input(s): CKTOTAL, CKMB, CKMBINDEX, TROPONINI in the last 168 hours. BNP (last 3 results) No results for input(s): PROBNP in the last 8760 hours. HbA1C: No results for input(s): HGBA1C in the last 72 hours. CBG: Recent Labs  Lab 01/22/18 0736 01/22/18 1611 01/22/18 2108 01/23/18 0713 01/23/18 1108  GLUCAP 142* 114* 148* 127* 219*   Lipid Profile: No results for input(s): CHOL, HDL, LDLCALC, TRIG, CHOLHDL, LDLDIRECT in the last 72 hours. Thyroid Function Tests: No results for input(s): TSH, T4TOTAL, FREET4, T3FREE, THYROIDAB in the last 72 hours. Anemia Panel: No results for input(s): VITAMINB12, FOLATE, FERRITIN, TIBC, IRON, RETICCTPCT in the last 72 hours. Sepsis Labs: No results for input(s): PROCALCITON, LATICACIDVEN in the last 168 hours.  Recent Results (from the past 240 hour(s))  C Difficile Quick Screen w PCR reflex     Status: None   Collection Time: 01/18/18 10:40 AM  Result Value Ref Range Status   C Diff antigen  NEGATIVE NEGATIVE Final   C Diff toxin NEGATIVE NEGATIVE Final   C Diff interpretation No C. difficile detected.  Final    Comment: Performed at [facility], [address] [place], [place] [address]         Radiology Studies: Nm Gastric Emptying  Result Date: 01/22/2018 CLINICAL DATA:  Early satiety.  Diabetes mellitus EXAM: NUCLEAR MEDICINE GASTRIC EMPTYING SCAN TECHNIQUE: After oral ingestion of radiolabeled meal, sequential abdominal images were obtained for 4 hours. Percentage of activity emptying the stomach was calculated at 1 hour, 2 hour, 3 hour, and 4 hours. RADIOPHARMACEUTICALS:  2.1 mCi Tc-12m sulfur colloid in standardized meal including egg COMPARISON:  None. FINDINGS: Expected location of the stomach in the left upper quadrant. Ingested meal empties the stomach gradually over the course of the study. 31.8% emptied at 1 hr ( normal >= 10%) 51.9% emptied at  2 hr ( normal >= 40%) 63.0% emptied at 3 hr ( normal >= 70%) 71.5% emptied at 4 hr ( normal >= 90%) IMPRESSION: Delayed gastric emptying study at 3 and 4 hours. This study is concerning for a degree gastric paresis. Electronically Signed   By: Bretta Bang III M.D.   On: 01/22/2018 14:12        Scheduled Meds: . amLODipine  5 mg Oral Daily  . aspirin EC  81 mg Oral Daily  . brimonidine  1 drop Both Eyes TID  . cholecalciferol  1,000 Units Oral Daily  . colestipol  1 g Oral BID  . dorzolamide-timolol  1 drop Both Eyes BID  . enoxaparin (LOVENOX) injection  30 mg Subcutaneous Q24H  . feeding supplement  1 Container Oral TID BM  . Gerhardt's butt cream   Topical BID  . latanoprost  1 drop Both Eyes QHS  . metoCLOPramide  5 mg Oral TID AC & HS  . nystatin  5 mL Oral QID  . pravastatin  40 mg Oral Daily  . saccharomyces boulardii  500 mg Oral BID   Continuous Infusions: . fluconazole (DIFLUCAN) IV 100 mg (01/23/18 0935)     LOS: 4 days    Time spent: 35 minutes.     Alba Cory, MD [facility]   If 7PM-7AM, please contact night-coverage [email or web address] Password [email or web address] 01/23/2018, 1:22 PM

## 2018-01-24 LAB — GLUCOSE, CAPILLARY
Glucose-Capillary: 134 mg/dL — ABNORMAL HIGH (ref 70–99)
Glucose-Capillary: 224 mg/dL — ABNORMAL HIGH (ref 70–99)
Glucose-Capillary: 257 mg/dL — ABNORMAL HIGH (ref 70–99)

## 2018-01-24 MED ORDER — FLUCONAZOLE 100 MG PO TABS
100.0000 mg | ORAL_TABLET | Freq: Every day | ORAL | Status: DC
  Administered 2018-01-25: 100 mg via ORAL
  Filled 2018-01-24: qty 1

## 2018-01-24 NOTE — Progress Notes (Signed)
PHARMACIST - PHYSICIAN COMMUNICATION  CONCERNING: Antibiotic IV to Oral Route Change Policy  RECOMMENDATION: This patient is receiving fluconazole by the intravenous route.  Based on criteria approved by the Pharmacy and Therapeutics Committee, the antibiotic(s) is/are being converted to the equivalent oral dose form(s).   DESCRIPTION: These criteria include:  Patient being treated for a respiratory tract infection, urinary tract infection, cellulitis or clostridium difficile associated diarrhea if on metronidazole  The patient is not neutropenic and does not exhibit a GI malabsorption state  The patient is eating (either orally or via tube) and/or has been taking other orally administered medications for a least 24 hours  The patient is improving clinically and has a Tmax < 100.5  If you have questions about this conversion, please contact the Pharmacy Department  []   [phone] )  [facility] []   [phone] )  [facility] []   [phone] )  [facility] []   [phone] )  [facility] [x]   [phone] )  [facility]  Herby Abraham, [place].D  01/24/2018 12:26 PM

## 2018-01-24 NOTE — Progress Notes (Signed)
PROGRESS NOTE    Adrienne Gonzalez  ZOX:[id number] DOB: [birth year] DOA: 01/15/2018 PCP: Laurann MontanaWhite, Cynthia, MD    Brief Narrative: 85 year old with past medical history significant for diabetes, hypertension who was admitted with recurrent nausea vomiting and diarrhea.  She was recently discharged from [facility] prior to this admission for the same complaint.  She had a previous CT that showed diverticulosis without diverticulitis she was discharged on glimepiride.  She presented with low blood sugar.    Assessment & Plan:   Active Problems:   Hypoglycemia   FTT (failure to thrive) in adult   Hypokalemia   CKD (chronic kidney disease), stage III (HCC)   N&V (nausea and vomiting)   Diarrhea, progressive weakness: C. difficile was negative. Cortisol level unremarkable. She was previously on valsartan and subsequently changed to [facility], concern for omelsartan colitis.  She underwent flexible sigmoidoscopy which showed hard stool in the sigmoid colon and a small rectal ulcer. GI is recommending to start colestipol.  Change Glucerna to breeze/ in case of some lactose intolerance  improved.   Nausea vomiting; She appears to have oral thrush. I will start nystatin and IV Diflucan We will get gastric emptying study, unable to get done today. Try tomorrow.  GI following. KUB was negative for obstruction. Improved.  Esophagogram showed some degree of gastroparesis. Started on reglan.  Intake improving, requiring IV fluids still.  Hopefully home tomorrow.   Oral thrush; continue with  IV Diflucan.   Hypokalemia, hypomagnesemia replete.  AKI on chronic kidney disease a stage II; Improving.  Will resume IV fluids due to diarrhea.  Anemia of chronic disease hemoglobin is stable around 10.  Hypertension Stop avapro.  She was previously on omelsartan which was thought to cause colitis.  Continue with norvasc   Hyperlipidemia; On a statin  FTT: Prior to diarrhea patient was very  active she used to exercise on a stationary bike for 45 minutes 3 times a week.  Since she has been having the problem with the diarrhea she is been weak.  She will need PT she will need home health.   Nutrition Problem: Inadequate oral intake Etiology: nausea, vomiting    Signs/Symptoms: per patient/family report    Interventions: Boost Breeze  Estimated body mass index is 25.4 kg/m as calculated from the following:   Height as of this encounter: 5' (1.524 m).   Weight as of this encounter: 59 kg.   DVT prophylaxis: Lovenox Code Status: Full code Family Communication: Daughter at bedside Disposition Plan: Remain in the hospital for further evaluation of nausea, poor oral intake.  Consultants:   GI   Procedures:  Sigmoidoscopy; show stool in the sigmoid colon, and a small distal rectal ulcer   Antimicrobials:  None   Subjective: Ate more yesterday, 35 %.  She relates improvement of nausea .  Plan to see how she eats today   Objective: Vitals:   01/23/18 0453 01/23/18 1315 01/23/18 2012 01/24/18 0443  BP: 119/61 110/77 128/60 139/68  Pulse: 71 69 70 71  Resp: 16 16 20 18   Temp: 98.8 F (37.1 C) 98.1 F (36.7 C) 99.1 F (37.3 C) 98.7 F (37.1 C)  TempSrc:  Oral Oral Oral  SpO2: 100% 100% 100% 100%  Weight:      Height:        Intake/Output Summary (Last 24 hours) at 01/24/2018 1418 Last data filed at 01/24/2018 0600 Gross per 24 hour  Intake 329 ml  Output -  Net 329 ml  Filed Weights   01/15/18 1346 01/19/18 1100  Weight: 59 kg 59 kg    Examination:  General exam: NAD Respiratory system: CTA Cardiovascular system: S 1, S 2 RRR Gastrointestinal system: BS present, soft, nt Central nervous system:alert Extremities: Symmetric power.    Data Reviewed: I have personally reviewed following labs and imaging studies  CBC: Recent Labs  Lab 01/18/18 0735 01/20/18 0550 01/21/18 0546 01/23/18 0819  WBC 7.3 6.2 5.1 5.7  NEUTROABS  --  3.4   --   --   HGB 10.3* 10.0* 10.7* 9.8*  HCT 32.3* 30.8* 33.7* 31.0*  MCV 93.4 93.3 92.6 93.7  PLT 176 163 195 202   Basic Metabolic Panel: Recent Labs  Lab 01/18/18 0735 01/19/18 0927 01/20/18 0550 01/21/18 0546 01/23/18 0819  NA 137 135 135 137 139  K 4.2 4.0 3.8 3.7 3.5  CL 114* 107 104 108 109  CO2 17* 22 22 22 22   GLUCOSE 152* 151* 154* 155* 144*  BUN 8 11 15 9 11   CREATININE 1.01* 0.96 1.05* 1.06* 0.94  CALCIUM 7.8* 7.9* 8.0* 7.9* 8.0*  MG 1.9 1.7 2.3  --   --    GFR: Estimated Creatinine Clearance: 32 mL/min (by C-G formula based on SCr of 0.94 mg/dL). Liver Function Tests: No results for input(s): AST, ALT, ALKPHOS, BILITOT, PROT, ALBUMIN in the last 168 hours. No results for input(s): LIPASE, AMYLASE in the last 168 hours. No results for input(s): AMMONIA in the last 168 hours. Coagulation Profile: No results for input(s): INR, PROTIME in the last 168 hours. Cardiac Enzymes: No results for input(s): CKTOTAL, CKMB, CKMBINDEX, TROPONINI in the last 168 hours. BNP (last 3 results) No results for input(s): PROBNP in the last 8760 hours. HbA1C: No results for input(s): HGBA1C in the last 72 hours. CBG: Recent Labs  Lab 01/23/18 1108 01/23/18 1647 01/23/18 2010 01/24/18 0844 01/24/18 1241  GLUCAP 219* 184* 229* 134* 224*   Lipid Profile: No results for input(s): CHOL, HDL, LDLCALC, TRIG, CHOLHDL, LDLDIRECT in the last 72 hours. Thyroid Function Tests: No results for input(s): TSH, T4TOTAL, FREET4, T3FREE, THYROIDAB in the last 72 hours. Anemia Panel: No results for input(s): VITAMINB12, FOLATE, FERRITIN, TIBC, IRON, RETICCTPCT in the last 72 hours. Sepsis Labs: No results for input(s): PROCALCITON, LATICACIDVEN in the last 168 hours.  Recent Results (from the past 240 hour(s))  C Difficile Quick Screen w PCR reflex     Status: None   Collection Time: 01/18/18 10:40 AM  Result Value Ref Range Status   C Diff antigen NEGATIVE NEGATIVE Final   C Diff toxin  NEGATIVE NEGATIVE Final   C Diff interpretation No C. difficile detected.  Final    Comment: Performed at [facility], [address], [place], [place] [address]         Radiology Studies: No results found.      Scheduled Meds: . amLODipine  5 mg Oral Daily  . aspirin EC  81 mg Oral Daily  . brimonidine  1 drop Both Eyes TID  . cholecalciferol  1,000 Units Oral Daily  . colestipol  1 g Oral BID  . dorzolamide-timolol  1 drop Both Eyes BID  . enoxaparin (LOVENOX) injection  30 mg Subcutaneous Q24H  . feeding supplement  1 Container Oral TID BM  . [START ON 01/25/2018] fluconazole  100 mg Oral Daily  . Gerhardt's butt cream   Topical BID  . latanoprost  1 drop Both Eyes QHS  . metoCLOPramide  5 mg Oral TID AC & HS  . nystatin  5 mL Oral QID  . pravastatin  40 mg Oral Daily  . saccharomyces boulardii  500 mg Oral BID   Continuous Infusions: . sodium chloride 100 mL/hr at 01/24/18 1042     LOS: 5 days    Time spent: 35 minutes.     Alba CoryBelkys A Husayn Reim, MD [facility]   If 7PM-7AM, please contact night-coverage [email or web address] Password [email or web address] 01/24/2018, 2:18 PM

## 2018-01-25 LAB — GLUCOSE, CAPILLARY
Glucose-Capillary: 145 mg/dL — ABNORMAL HIGH (ref 70–99)
Glucose-Capillary: 192 mg/dL — ABNORMAL HIGH (ref 70–99)

## 2018-01-25 MED ORDER — SACCHAROMYCES BOULARDII 250 MG PO CAPS: 500.0000 mg | ORAL_CAPSULE | Freq: Two times a day (BID) | ORAL | 0 refills | Status: DC

## 2018-01-25 MED ORDER — COLESTIPOL HCL 1 G PO TABS: 1.0000 g | ORAL_TABLET | Freq: Two times a day (BID) | ORAL | 0 refills | Status: AC

## 2018-01-25 MED ORDER — METOCLOPRAMIDE HCL 5 MG PO TABS: 5.0000 mg | ORAL_TABLET | Freq: Three times a day (TID) | ORAL | 0 refills | Status: DC

## 2018-01-25 MED ORDER — AMLODIPINE BESYLATE 5 MG PO TABS: 5.0000 mg | ORAL_TABLET | Freq: Every day | ORAL | 0 refills | Status: AC

## 2018-01-25 MED ORDER — FLUCONAZOLE 100 MG PO TABS: 100.0000 mg | ORAL_TABLET | Freq: Every day | ORAL | 0 refills | Status: DC

## 2018-01-25 NOTE — Discharge Summary (Signed)
Physician Discharge Summary  Adrienne Gonzalez NUU:[id number] DOB: 08-17-1925 DOA: 01/15/2018  PCP: Laurann Montana, MD  Admit date: 01/15/2018 Discharge date: 01/25/2018  Admitted From: home  Disposition: Home   Recommendations for Outpatient Follow-up:  1. Follow up with PCP in 1-2 weeks 2. Please obtain BMP/CBC in one week 3. Follow up with GI for further care gastroparesis.   Home Health: yes, HH, RN, PT>   Discharge Condition: stable.  CODE STATUS: full code.  Diet recommendation: Carb Modified  Brief/Interim Summary: 85 year old with past medical history significant for diabetes, hypertension who was admitted with recurrent nausea vomiting and diarrhea.  She was recently discharged from [facility] prior to this admission for the same complaint.  She had a previous CT that showed diverticulosis without diverticulitis she was discharged on glimepiride.  She presented with low blood sugar.   Diarrhea, progressive weakness: C. difficile was negative. Cortisol level unremarkable. She was previously on valsartan and subsequently changed to [facility], concern for omelsartan colitis.  She underwent flexible sigmoidoscopy which showed hard stool in the sigmoid colon and a small rectal ulcer. GI is recommending to start colestipol.  Change Glucerna to breeze/ in case of some lactose intolerance  improved.   Nausea vomiting; She appears to have oral thrush. I will start nystatin and IV Diflucan We will get gastric emptying study, unable to get done today. Try tomorrow.  GI following. KUB was negative for obstruction. Improved.  Esophagogram showed some degree of gastroparesis. Started on reglan.  Intake improving, on reglan. Plan to discharge home today.   Oral thrush; continue with  IV Diflucan. Improved. Will provide 7 more days of fluconazole.    Hypokalemia, hypomagnesemia replete.  AKI on chronic kidney disease a stage II; Improving.  treated with IV fluids.   Anemia of  chronic disease hemoglobin is stable around 10.  Hypertension Stop avapro.  She was previously on omelsartan which was thought to cause colitis.  Continue with norvasc , tolerating it.   Hyperlipidemia; On a statin  FTT: Prior to diarrhea patient was very active she used to exercise on a stationary bike for 45 minutes 3 times a week.  Since she has been having the problem with the diarrhea she is been weak.   Improved.  I have arrange HH  Inadequate oral intake;  Improved at discharge.   DM;  She presented with hypoglycemia, from not eating. She was on insulin and glipizide.  CBG higher at 200. In am today 135.  Continue to hold hypoglycemic agents until she is able to eat more.   Discharge Diagnoses:  Active Problems:   Hypoglycemia   FTT (failure to thrive) in adult   Hypokalemia   CKD (chronic kidney disease), stage III (HCC)   N&V (nausea and vomiting)    Discharge Instructions  Discharge Instructions    Diet - low sodium heart healthy   Complete by:  As directed    Increase activity slowly   Complete by:  As directed      Allergies as of 01/25/2018      Reactions   Benzocaine Other (See Comments)   confusion   Metformin And Related Diarrhea   Morphine And Related Itching      Medication List    STOP taking these medications   olmesartan 40 MG tablet Commonly known as:  BENICAR     TAKE these medications   ALPHAGAN P 0.1 % Soln Generic drug:  brimonidine Place 1 drop into both eyes 3 (three) times  daily.   amLODipine 5 MG tablet Commonly known as:  NORVASC Take 1 tablet (5 mg total) by mouth daily. Start taking on:  January 26, 2018   aspirin EC 81 MG tablet Take 81 mg by mouth daily.   bimatoprost 0.01 % Soln Commonly known as:  LUMIGAN Place 1 drop into both eyes at bedtime.   cholecalciferol 25 MCG (1000 UT) tablet Commonly known as:  VITAMIN D3 Take 1,000 Units by mouth daily.   colestipol 1 g tablet Commonly known as:   COLESTID Take 1 tablet (1 g total) by mouth 2 (two) times daily.   dorzolamide-timolol 22.3-6.8 MG/ML ophthalmic solution Commonly known as:  COSOPT Place 1 drop into both eyes 2 (two) times daily.   fluconazole 100 MG tablet Commonly known as:  DIFLUCAN Take 1 tablet (100 mg total) by mouth daily. Start taking on:  January 26, 2018   metoCLOPramide 5 MG tablet Commonly known as:  REGLAN Take 1 tablet (5 mg total) by mouth 4 (four) times daily -  before meals and at bedtime.   pravastatin 40 MG tablet Commonly known as:  PRAVACHOL Take 40 mg by mouth daily.   saccharomyces boulardii 250 MG capsule Commonly known as:  FLORASTOR Take 2 capsules (500 mg total) by mouth 2 (two) times daily.            Durable Medical Equipment  (From admission, onward)         Start     Ordered   01/20/18 1222  For home use only DME Walker rolling  Once    Question:  Patient needs a walker to treat with the following condition  Answer:  Weakness generalized   01/20/18 1221         Follow-up Information    Laurann Montana, MD Follow up in 1 week(s).   Specialty:  Family Medicine Why:  hospital discharge follow up, repeat cbc/bmp at follow up.  Contact information: [address] [place] [address] [phone]        Gastroenterology, Deboraha Sprang Follow up.   Contact information: [address] [place] [address] [phone]          Allergies  Allergen Reactions  . Benzocaine Other (See Comments)    confusion  . Metformin And Related Diarrhea  . Morphine And Related Itching    Consultations:  [facility]    Procedures/Studies: Ct Abdomen Pelvis Wo Contrast  Result Date: 01/08/2018 CLINICAL DATA:  Watery diarrhea for several days EXAM: CT ABDOMEN AND PELVIS WITHOUT CONTRAST TECHNIQUE: Multidetector CT imaging of the abdomen and pelvis was performed following the standard protocol without IV contrast. COMPARISON:  08/10/2007. FINDINGS:  Lower chest: No acute abnormality. Hepatobiliary: No focal liver abnormality is seen. Status post cholecystectomy. No biliary dilatation. Pancreas: Unremarkable. No pancreatic ductal dilatation or surrounding inflammatory changes. Spleen: Normal in size without focal abnormality. Adrenals/Urinary Tract: Adrenal glands are within normal limits. Kidneys demonstrate no renal calculi or obstructive change. Stable left renal cyst is noted measuring 2.1 cm. Bladder is decompressed. Stomach/Bowel: Diverticular change of the colon is noted without evidence of diverticulitis. Fluid and fecal material is noted throughout the colon. The appendix is within normal limits. No small bowel obstructive changes are seen. Vascular/Lymphatic: Aortic atherosclerosis. No enlarged abdominal or pelvic lymph nodes. Reproductive: Uterus is not well visualized and has likely been surgically removed. No adnexal mass is noted. Other: No abdominal wall hernia or abnormality. No abdominopelvic ascites. Musculoskeletal: Degenerative changes  of lumbar spine are seen. No acute bony abnormality is noted. IMPRESSION: Diverticulosis without diverticulitis. Fluid and fecal material throughout the colon consistent with the given clinical history of diarrhea. Chronic changes without acute abnormality. Electronically Signed   By: Alcide CleverMark  Lukens M.D.   On: 01/08/2018 15:42   Dg Abd 1 View  Result Date: 01/19/2018 CLINICAL DATA:  Weight loss and failure to thrive EXAM: ABDOMEN - 1 VIEW COMPARISON:  01/08/2018 FINDINGS: Scattered large and small bowel gas without obstructive change. No free air is noted. No abnormal mass or abnormal calcifications are seen. Degenerative changes of lumbar spine are noted. IMPRESSION: No acute abnormality seen. Electronically Signed   By: Alcide CleverMark  Lukens M.D.   On: 01/19/2018 19:01   Nm Gastric Emptying  Result Date: 01/22/2018 CLINICAL DATA:  Early satiety.  Diabetes mellitus EXAM: NUCLEAR MEDICINE GASTRIC EMPTYING SCAN  TECHNIQUE: After oral ingestion of radiolabeled meal, sequential abdominal images were obtained for 4 hours. Percentage of activity emptying the stomach was calculated at 1 hour, 2 hour, 3 hour, and 4 hours. RADIOPHARMACEUTICALS:  2.1 mCi [id number] sulfur colloid in standardized meal including egg COMPARISON:  None. FINDINGS: Expected location of the stomach in the left upper quadrant. Ingested meal empties the stomach gradually over the course of the study. 31.8% emptied at 1 hr ( normal >= 10%) 51.9% emptied at 2 hr ( normal >= 40%) 63.0% emptied at 3 hr ( normal >= 70%) 71.5% emptied at 4 hr ( normal >= 90%) IMPRESSION: Delayed gastric emptying study at 3 and 4 hours. This study is concerning for a degree gastric paresis. Electronically Signed   By: Bretta BangWilliam  Woodruff III M.D.   On: 01/22/2018 14:12    Subjective: She has started to eat more. No nausea.   Discharge Exam: Vitals:   01/25/18 0443 01/25/18 0900  BP: (!) 153/68 129/67  Pulse: 70 94  Resp: 20   Temp: 98.5 F (36.9 C)   SpO2: 100% 100%   Vitals:   01/24/18 2200 01/25/18 0200 01/25/18 0443 01/25/18 0900  BP: (!) 146/66 138/67 (!) 153/68 129/67  Pulse:  76 70 94  Resp:   20   Temp:   98.5 F (36.9 C)   TempSrc:   Oral   SpO2:   100% 100%  Weight:      Height:        General: Pt is alert, awake, not in acute distress Cardiovascular: RRR, S1/S2 +, no rubs, no gallops Respiratory: CTA bilaterally, no wheezing, no rhonchi Abdominal: Soft, NT, ND, bowel sounds + Extremities: no edema, no cyanosis    The results of significant diagnostics from this hospitalization (including imaging, microbiology, ancillary and laboratory) are listed below for reference.     Microbiology: Recent Results (from the past 240 hour(s))  C Difficile Quick Screen w PCR reflex     Status: None   Collection Time: 01/18/18 10:40 AM  Result Value Ref Range Status   C Diff antigen NEGATIVE NEGATIVE Final   C Diff toxin NEGATIVE NEGATIVE Final   C  Diff interpretation No C. difficile detected.  Final    Comment: Performed at [facility], [address] [place], [place] [address]     Labs: BNP (last 3 results) No results for input(s): BNP in the last 8760 hours. Basic Metabolic Panel: Recent Labs  Lab 01/19/18 0927 01/20/18 0550 01/21/18 0546 01/23/18 0819  NA 135 135 137 139  K 4.0 3.8 3.7 3.5  CL 107 104 108 109  CO2 22 22 22 22   GLUCOSE 151* 154* 155* 144*  BUN 11 15 9 11   CREATININE 0.96 1.05* 1.06* 0.94  CALCIUM 7.9* 8.0* 7.9* 8.0*  MG 1.7 2.3  --   --    Liver Function Tests: No results for input(s): AST, ALT, ALKPHOS, BILITOT, PROT, ALBUMIN in the last 168 hours. No results for input(s): LIPASE, AMYLASE in the last 168 hours. No results for input(s): AMMONIA in the last 168 hours. CBC: Recent Labs  Lab 01/20/18 0550 01/21/18 0546 01/23/18 0819  WBC 6.2 5.1 5.7  NEUTROABS 3.4  --   --   HGB 10.0* 10.7* 9.8*  HCT 30.8* 33.7* 31.0*  MCV 93.3 92.6 93.7  PLT 163 195 202   Cardiac Enzymes: No results for input(s): CKTOTAL, CKMB, CKMBINDEX, TROPONINI in the last 168 hours. BNP: Invalid input(s): POCBNP CBG: Recent Labs  Lab 01/24/18 0844 01/24/18 1241 01/24/18 1636 01/25/18 0751 01/25/18 1145  GLUCAP 134* 224* 257* 145* 192*   D-Dimer No results for input(s): DDIMER in the last 72 hours. Hgb A1c No results for input(s): HGBA1C in the last 72 hours. Lipid Profile No results for input(s): CHOL, HDL, LDLCALC, TRIG, CHOLHDL, LDLDIRECT in the last 72 hours. Thyroid function studies No results for input(s): TSH, T4TOTAL, T3FREE, THYROIDAB in the last 72 hours.  Invalid input(s): FREET3 Anemia work up No results for input(s): VITAMINB12, FOLATE, FERRITIN, TIBC, IRON, RETICCTPCT in the last 72 hours. Urinalysis    Component Value Date/Time   COLORURINE YELLOW 01/16/2018 0315   APPEARANCEUR HAZY (A) 01/16/2018 0315   LABSPEC 1.009 01/16/2018 0315   PHURINE 5.0 01/16/2018 0315    GLUCOSEU 50 (A) 01/16/2018 0315   HGBUR SMALL (A) 01/16/2018 0315   BILIRUBINUR NEGATIVE 01/16/2018 0315   KETONESUR NEGATIVE 01/16/2018 0315   PROTEINUR NEGATIVE 01/16/2018 0315   UROBILINOGEN 0.2 06/28/2007 2238   NITRITE NEGATIVE 01/16/2018 0315   LEUKOCYTESUR NEGATIVE 01/16/2018 0315   Sepsis Labs Invalid input(s): PROCALCITONIN,  WBC,  LACTICIDVEN Microbiology Recent Results (from the past 240 hour(s))  C Difficile Quick Screen w PCR reflex     Status: None   Collection Time: 01/18/18 10:40 AM  Result Value Ref Range Status   C Diff antigen NEGATIVE NEGATIVE Final   C Diff toxin NEGATIVE NEGATIVE Final   C Diff interpretation No C. difficile detected.  Final    Comment: Performed at [facility], [address] [place], [place] [address]     Time coordinating discharge: Over 30 minutes  SIGNED:   Alba CoryBelkys A Mahesh Sizemore, MD  [facility] 01/25/2018, 2:58 PM Pager   If 7PM-7AM, please contact night-coverage [email or web address] Password [email or web address]

## 2018-01-25 NOTE — Plan of Care (Signed)
  Problem: Activity: Goal: Risk for activity intolerance will decrease Outcome: Progressing   Problem: Nutrition: Goal: Adequate nutrition will be maintained Outcome: Progressing   Problem: Pain Managment: Goal: General experience of comfort will improve Outcome: Progressing   

## 2018-01-25 NOTE — Progress Notes (Signed)
01/25/2018  @ 1300  REVIEWED DISCHARGE INSTRUCTIONS WITH PATIENT AND HER DAUGHTER. Pt's daughter verbalized understanding of discharge instructions. Copy of discharge instructions and prescriptions given to patient's daughter. Rolling walker with seat was also given to patient.

## 2018-01-25 NOTE — Care Management Note (Signed)
Case Management Note  Patient Details  Name: Adrienne Gonzalez MRN: [id number] Date of Birth: [birth year]  Subjective/Objective:                  discharged  Action/Plan: Returning to home with daughter [name], hhc needs- [facility] would like [facility].  [facility]n, pt, ot and [facility]  Expected Discharge Date:  01/25/18               Expected Discharge Plan:  Home w Home Health Services  In-House Referral:     Discharge planning Services  CM Consult  Post Acute Care Choice:  Durable Medical Equipment, Home Health Choice offered to:  NA  DME Arranged:  Walker rolling with seat DME Agency:  [facility]  HH Arranged:  RN, PT, OT, Nurse's Aide HH Agency:  [facility]  Status of Service:  Completed, signed off  If discussed at Long Length of Stay Meetings, dates discussed:    Additional Comments:  [name], Rhonda Lynn, RN 01/25/2018, 11:47 AM

## 2018-01-25 NOTE — Progress Notes (Signed)
Pt. Blood pressures was high and based on the PRN parameter

## 2018-03-02 ENCOUNTER — Other Ambulatory Visit: Payer: Self-pay

## 2018-03-02 ENCOUNTER — Encounter: Payer: Self-pay | Admitting: Podiatry

## 2018-03-02 ENCOUNTER — Ambulatory Visit: Payer: Medicare Other | Admitting: Podiatry

## 2018-03-02 VITALS — BP 132/68

## 2018-03-02 DIAGNOSIS — L84 Corns and callosities: Secondary | ICD-10-CM

## 2018-03-02 DIAGNOSIS — M79675 Pain in left toe(s): Secondary | ICD-10-CM

## 2018-03-02 DIAGNOSIS — B351 Tinea unguium: Secondary | ICD-10-CM | POA: Diagnosis not present

## 2018-03-02 DIAGNOSIS — M79674 Pain in right toe(s): Secondary | ICD-10-CM

## 2018-03-02 DIAGNOSIS — E119 Type 2 diabetes mellitus without complications: Secondary | ICD-10-CM

## 2018-03-02 NOTE — Patient Instructions (Signed)

## 2018-03-08 ENCOUNTER — Encounter: Payer: Self-pay | Admitting: Podiatry

## 2018-03-08 NOTE — Progress Notes (Signed)
Subjective: Adrienne Gonzalez presents today referred by Laurann Montana, MD for diabetic foot exam.  Patient presents with history of diabetes and cc of painful, elongated, discolored, thick toenails which interfere with daily activities.  Duration is several weeks. Pain is aggravated when wearing enclosed shoe gear.  She has not tried anything for relief and is afraid to cut her toenails because she is diabetic.    Past Medical History:  Diagnosis Date  . Chronic renal insufficiency, stage 1   . Diabetes mellitus without complication (HCC)   . Hypertension     Patient Active Problem List   Diagnosis Date Noted  . N&V (nausea and vomiting)   . FTT (failure to thrive) in adult   . Hypokalemia   . CKD (chronic kidney disease), stage III (HCC)   . Chronic renal insufficiency, stage 1 01/10/2018  . Metabolic acidosis 01/09/2018  . Hypoglycemia 01/08/2018  . Diarrhea 01/08/2018  . AKI (acute kidney injury) (HCC) 01/08/2018  . Diabetes mellitus without complication (HCC)   . Hypertension     Past Surgical History:  Procedure Laterality Date  . BIOPSY  01/19/2018   Procedure: BIOPSY;  Surgeon: Kathi Der, MD;  Location: [facility];  Service: Gastroenterology;;  . Wenda Low SIGMOIDOSCOPY N/A 01/19/2018   Procedure: FLEXIBLE SIGMOIDOSCOPY;  Surgeon: Kathi Der, MD;  Location: [facility];  Service: Gastroenterology;  Laterality: N/A;     Current Outpatient Medications:  .  amLODipine (NORVASC) 5 MG tablet, Take 1 tablet (5 mg total) by mouth daily., Disp: 30 tablet, Rfl: 0 .  aspirin EC 81 MG tablet, Take 81 mg by mouth daily., Disp: , Rfl:  .  bimatoprost (LUMIGAN) 0.01 % SOLN, Place 1 drop into both eyes at bedtime., Disp: , Rfl:  .  brimonidine (ALPHAGAN P) 0.1 % SOLN, Place 1 drop into both eyes 3 (three) times daily., Disp: , Rfl:  .  cholecalciferol (VITAMIN D3) 25 MCG (1000 UT) tablet, Take 1,000 Units by mouth daily., Disp: , Rfl:  .  colestipol (COLESTID) 1 g tablet,  Take 1 tablet (1 g total) by mouth 2 (two) times daily., Disp: 30 tablet, Rfl: 0 .  dorzolamide-timolol (COSOPT) 22.3-6.8 MG/ML ophthalmic solution, Place 1 drop into both eyes 2 (two) times daily., Disp: , Rfl:  .  glipiZIDE (GLUCOTROL) 5 MG tablet, TAKE 1 TABLET BY MOUTH ONCE A DAY BEFORE BREAKFAST IF SUGAR IS ABOVE 200, Disp: , Rfl:  .  metoCLOPramide (REGLAN) 5 MG tablet, Take 1 tablet (5 mg total) by mouth 4 (four) times daily -  before meals and at bedtime., Disp: 90 tablet, Rfl: 0 .  pravastatin (PRAVACHOL) 40 MG tablet, Take 40 mg by mouth daily., Disp: , Rfl:  .  fluconazole (DIFLUCAN) 100 MG tablet, Take 1 tablet (100 mg total) by mouth daily. (Patient not taking: Reported on 03/02/2018), Disp: 7 tablet, Rfl: 0 .  ondansetron (ZOFRAN) 4 MG tablet, TAKE 1 TABLET BY MOUTH 3 TIMES DAILY AS NEEDED FOR NASEA, Disp: , Rfl:  .  saccharomyces boulardii (FLORASTOR) 250 MG capsule, Take 2 capsules (500 mg total) by mouth 2 (two) times daily. (Patient not taking: Reported on 03/02/2018), Disp: 60 capsule, Rfl: 0  Allergies  Allergen Reactions  . Benzocaine Other (See Comments)    confusion  . Metformin And Related Diarrhea  . Morphine And Related Itching  . Olmesartan Medoxomil-Hctz Diarrhea    Social History   Occupational History  . Not on file  Tobacco Use  . Smoking status: Never Smoker  .  Smokeless tobacco: Never Used  Substance and Sexual Activity  . Alcohol use: Never    Frequency: Never  . Drug use: Never  . Sexual activity: Not on file    History reviewed. No pertinent family history.   There is no immunization history on file for this patient.   Review of systems: Positive Findings in bold print.  Constitutional:  chills, fatigue, fever, sweats, weight change Communication: Nurse, learning disability, sign Presenter, broadcasting, hand writing, iPad/Android device Head: headaches, head injury Eyes: changes in vision, eye pain, glaucoma, cataracts, macular degeneration, diplopia, glare,   light sensitivity, eyeglasses or contacts, blindness Ears nose mouth throat: Hard of hearing, ringing in ears, deaf, sign language,  vertigo,   nosebleeds,  rhinitis,  cold sores, snoring, swollen glands Cardiovascular: HTN, edema, arrhythmia, pacemaker in place, defibrillator in place,  chest pain/tightness, chronic anticoagulation, blood clot, heart failure Peripheral Vascular: leg cramps, varicose veins, blood clots, lymphedema Respiratory:  difficulty breathing, denies congestion, SOB, wheezing, cough, emphysema Gastrointestinal: change in appetite or weight, abdominal pain, constipation, diarrhea, nausea, vomiting, vomiting blood, change in bowel habits, abdominal pain, jaundice, rectal bleeding, hemorrhoids, Genitourinary:  nocturia,  pain on urination,  blood in urine, Foley catheter, urinary urgency Musculoskeletal: uses mobility aid,  cramping, stiff joints, painful joints, decreased joint motion, fractures, OA, gout Skin: +changes in toenails, color change, dryness, itching, mole changes,  rash  Neurological: headaches, numbness in feet, paresthesias in feet, burning in feet, fainting,  seizures, change in speech. denies headaches, memory problems/poor historian, cerebral palsy, weakness, paralysis Endocrine: diabetes, hypothyroidism, hyperthyroidism,  goiter, dry mouth, flushing, heat intolerance,  cold intolerance,  excessive thirst, denies polyuria,  nocturia Hematological:  easy bleeding, excessive bleeding, easy bruising, enlarged lymph nodes, on long term blood thinner, history of past transusions Allergy/immunological:  hives, eczema, frequent infections, multiple drug allergies, seasonal allergies, transplant recipient Psychiatric:  anxiety, depression, mood disorder, suicidal ideations, hallucinations   Objective: Vascular Examination: Capillary refill time immediate x 10 digits.  Dorsalis pedis and posterior tibial pulses present b/l.  No digital hair x 10 digits.  Skin  temperature gradient WNL b/l.  Dermatological Examination: Skin with normal turgor, texture and tone b/l  Toenails 1-5 b/l discolored, thick, dystrophic with subungual debris and pain with palpation to nailbeds due to thickness of nails.  Hyperkeratotic lesions noted submetatarsal head 5 bilaterally.  There is tenderness to palpation.  No erythema, no edema, no drainage, no flocculence noted.  Musculoskeletal: Muscle strength 5/5 to all LE muscle groups.  Hallux abductovalgus with bunion deformity noted bilaterally.  Hammertoe deformity noted digits 2-5 bilaterally.  Neurological: Sensation intact with 10 gram monofilament bilaterally.  Vibratory sensation intact bilaterally.  Assessment: 1. Painful onychomycosis toenails 1-5 b/l  2. Callus submetatarsal head 5 bilaterally 3. NIDDM  Plan: 1. Discussed diabetic foot care principles. Literature dispensed on today. 2. Toenails 1-5 b/l were debrided in length and girth without iatrogenic bleeding. 3. Calluses pared with sterile scalpel blade submetatarsal head 5 bilaterally without incident. 4. Patient to continue soft, supportive shoe gear daily. 5. Patient to report any pedal injuries to medical professional immediately. 6. Follow up 3 months.  7. Patient/POA to call should there be a concern in the interim.

## 2018-03-26 ENCOUNTER — Ambulatory Visit: Payer: Medicare Other | Admitting: Orthotics

## 2018-03-29 ENCOUNTER — Ambulatory Visit: Payer: Medicare Other | Admitting: Orthotics

## 2018-05-18 ENCOUNTER — Other Ambulatory Visit: Payer: Self-pay

## 2018-05-18 ENCOUNTER — Ambulatory Visit (INDEPENDENT_AMBULATORY_CARE_PROVIDER_SITE_OTHER): Payer: Self-pay | Admitting: Orthotics

## 2018-05-18 ENCOUNTER — Ambulatory Visit: Payer: Medicare Other | Admitting: Podiatry

## 2018-05-18 VITALS — Temp 96.6°F

## 2018-05-18 DIAGNOSIS — M79675 Pain in left toe(s): Secondary | ICD-10-CM

## 2018-05-18 DIAGNOSIS — M79674 Pain in right toe(s): Secondary | ICD-10-CM | POA: Diagnosis not present

## 2018-05-18 DIAGNOSIS — L84 Corns and callosities: Secondary | ICD-10-CM

## 2018-05-18 DIAGNOSIS — B351 Tinea unguium: Secondary | ICD-10-CM

## 2018-05-18 DIAGNOSIS — E119 Type 2 diabetes mellitus without complications: Secondary | ICD-10-CM

## 2018-05-18 NOTE — Patient Instructions (Signed)
Diabetes Mellitus and Foot Care Foot care is an important part of your health, especially when you have diabetes. Diabetes may cause you to have problems because of poor blood flow (circulation) to your feet and legs, which can cause your skin to:  Become thinner and drier.  Break more easily.  Heal more slowly.  Peel and crack. You may also have nerve damage (neuropathy) in your legs and feet, causing decreased feeling in them. This means that you may not notice minor injuries to your feet that could lead to more serious problems. Noticing and addressing any potential problems early is the best way to prevent future foot problems. How to care for your feet Foot hygiene  Wash your feet daily with warm water and mild soap. Do not use hot water. Then, pat your feet and the areas between your toes until they are completely dry. Do not soak your feet as this can dry your skin.  Trim your toenails straight across. Do not dig under them or around the cuticle. File the edges of your nails with an emery board or nail file.  Apply a moisturizing lotion or petroleum jelly to the skin on your feet and to dry, brittle toenails. Use lotion that does not contain alcohol and is unscented. Do not apply lotion between your toes. Shoes and socks  Wear clean socks or stockings every day. Make sure they are not too tight. Do not wear knee-high stockings since they may decrease blood flow to your legs.  Wear shoes that fit properly and have enough cushioning. Always look in your shoes before you put them on to be sure there are no objects inside.  To break in new shoes, wear them for just a few hours a day. This prevents injuries on your feet. Wounds, scrapes, corns, and calluses  Check your feet daily for blisters, cuts, bruises, sores, and redness. If you cannot see the bottom of your feet, use a mirror or ask someone for help.  Do not cut corns or calluses or try to remove them with medicine.  If you  find a minor scrape, cut, or break in the skin on your feet, keep it and the skin around it clean and dry. You may clean these areas with mild soap and water. Do not clean the area with peroxide, alcohol, or iodine.  If you have a wound, scrape, corn, or callus on your foot, look at it several times a day to make sure it is healing and not infected. Check for: ? Redness, swelling, or pain. ? Fluid or blood. ? Warmth. ? Pus or a bad smell. General instructions  Do not cross your legs. This may decrease blood flow to your feet.  Do not use heating pads or hot water bottles on your feet. They may burn your skin. If you have lost feeling in your feet or legs, you may not know this is happening until it is too late.  Protect your feet from hot and cold by wearing shoes, such as at the beach or on hot pavement.  Schedule a complete foot exam at least once a year (annually) or more often if you have foot problems. If you have foot problems, report any cuts, sores, or bruises to your health care provider immediately. Contact a health care provider if:  You have a medical condition that increases your risk of infection and you have any cuts, sores, or bruises on your feet.  You have an injury that is not   healing.  You have redness on your legs or feet.  You feel burning or tingling in your legs or feet.  You have pain or cramps in your legs and feet.  Your legs or feet are numb.  Your feet always feel cold.  You have pain around a toenail. Get help right away if:  You have a wound, scrape, corn, or callus on your foot and: ? You have pain, swelling, or redness that gets worse. ? You have fluid or blood coming from the wound, scrape, corn, or callus. ? Your wound, scrape, corn, or callus feels warm to the touch. ? You have pus or a bad smell coming from the wound, scrape, corn, or callus. ? You have a fever. ? You have a red line going up your leg. Summary  Check your feet every day  for cuts, sores, red spots, swelling, and blisters.  Moisturize feet and legs daily.  Wear shoes that fit properly and have enough cushioning.  If you have foot problems, report any cuts, sores, or bruises to your health care provider immediately.  Schedule a complete foot exam at least once a year (annually) or more often if you have foot problems. This information is not intended to replace advice given to you by your health care provider. Make sure you discuss any questions you have with your health care provider. Document Released: 12/21/1999 Document Revised: 02/04/2017 Document Reviewed: 01/25/2016 Elsevier Interactive Patient Education  2019 [employer]  Onychomycosis/Fungal Toenails  WHAT IS IT? An infection that lies within the keratin of your nail plate that is caused by a fungus.  WHY ME? Fungal infections affect all ages, sexes, races, and creeds.  There may be many factors that predispose you to a fungal infection such as age, coexisting medical conditions such as diabetes, or an autoimmune disease; stress, medications, fatigue, genetics, etc.  Bottom line: fungus thrives in a warm, moist environment and your shoes offer such a location.  IS IT CONTAGIOUS? Theoretically, yes.  You do not want to share shoes, nail clippers or files with someone who has fungal toenails.  Walking around barefoot in the same room or sleeping in the same bed is unlikely to transfer the organism.  It is important to realize, however, that fungus can spread easily from one nail to the next on the same foot.  HOW DO WE TREAT THIS?  There are several ways to treat this condition.  Treatment may depend on many factors such as age, medications, pregnancy, liver and kidney conditions, etc.  It is best to ask your doctor which options are available to you.  1. No treatment.   Unlike many other medical concerns, you can live with this condition.  However for many people this can be a painful condition and  may lead to ingrown toenails or a bacterial infection.  It is recommended that you keep the nails cut short to help reduce the amount of fungal nail. 2. Topical treatment.  These range from herbal remedies to prescription strength nail lacquers.  About 40-50% effective, topicals require twice daily application for approximately 9 to 12 months or until an entirely new nail has grown out.  The most effective topicals are medical grade medications available through physicians offices. 3. Oral antifungal medications.  With an 80-90% cure rate, the most common oral medication requires 3 to 4 months of therapy and stays in your system for a year as the new nail grows out.  Oral antifungal medications do require   blood work to make sure it is a safe drug for you.  A liver function panel will be performed prior to starting the medication and after the first month of treatment.  It is important to have the blood work performed to avoid any harmful side effects.  In general, this medication safe but blood work is required. 4. Laser Therapy.  This treatment is performed by applying a specialized laser to the affected nail plate.  This therapy is noninvasive, fast, and non-painful.  It is not covered by insurance and is therefore, out of pocket.  The results have been very good with a 80-95% cure rate.  The [facility] is the only practice in the area to offer this therapy. 5. Permanent Nail Avulsion.  Removing the entire nail so that a new nail will not grow back. 

## 2018-05-18 NOTE — Progress Notes (Signed)
Patient had appointment today for definitive and final diabetic shoe fitting and delivery.  Patient was seen by Betha, C.Ped, [facility].   The inserts fit well and accomplished full contact with the plantar surface of the foot bilateral; the shoes fit well and offered forefoot freedom, no noticible heel slippage, and good toe clearance w/ the insert in place.  Patient was advised to monitor of any skin irritation, breakdown.  Patient was satisfied with fit and function. 

## 2018-05-23 ENCOUNTER — Encounter: Payer: Self-pay | Admitting: Podiatry

## 2018-05-23 NOTE — Progress Notes (Addendum)
Subjective: Adrienne Gonzalez is a 85 y.o. y.o. female is here for preventative diabetic foot care who presents today with cc of painful, discolored, thick toenails and plantar calluses which interfere with daily activities. Pain is aggravated when wearing enclosed shoe gear and relieved with periodic professional debridement.  Laurann Montana, MD is her PCP.    Current Outpatient Medications:  .  amLODipine (NORVASC) 5 MG tablet, Take 1 tablet (5 mg total) by mouth daily., Disp: 30 tablet, Rfl: 0 .  aspirin EC 81 MG tablet, Take 81 mg by mouth daily., Disp: , Rfl:  .  bimatoprost (LUMIGAN) 0.01 % SOLN, Place 1 drop into both eyes at bedtime., Disp: , Rfl:  .  brimonidine (ALPHAGAN P) 0.1 % SOLN, Place 1 drop into both eyes 3 (three) times daily., Disp: , Rfl:  .  cholecalciferol (VITAMIN D3) 25 MCG (1000 UT) tablet, Take 1,000 Units by mouth daily., Disp: , Rfl:  .  colestipol (COLESTID) 1 g tablet, Take 1 tablet (1 g total) by mouth 2 (two) times daily., Disp: 30 tablet, Rfl: 0 .  dorzolamide-timolol (COSOPT) 22.3-6.8 MG/ML ophthalmic solution, Place 1 drop into both eyes 2 (two) times daily., Disp: , Rfl:  .  fluconazole (DIFLUCAN) 100 MG tablet, Take 1 tablet (100 mg total) by mouth daily. (Patient not taking: Reported on 03/02/2018), Disp: 7 tablet, Rfl: 0 .  glipiZIDE (GLUCOTROL) 5 MG tablet, TAKE 1 TABLET BY MOUTH ONCE A DAY BEFORE BREAKFAST IF SUGAR IS ABOVE 200, Disp: , Rfl:  .  metoCLOPramide (REGLAN) 5 MG tablet, Take 1 tablet (5 mg total) by mouth 4 (four) times daily -  before meals and at bedtime., Disp: 90 tablet, Rfl: 0 .  ondansetron (ZOFRAN) 4 MG tablet, TAKE 1 TABLET BY MOUTH 3 TIMES DAILY AS NEEDED FOR NASEA, Disp: , Rfl:  .  pravastatin (PRAVACHOL) 40 MG tablet, Take 40 mg by mouth daily., Disp: , Rfl:  .  saccharomyces boulardii (FLORASTOR) 250 MG capsule, Take 2 capsules (500 mg total) by mouth 2 (two) times daily. (Patient not taking: Reported on 03/02/2018), Disp: 60 capsule, Rfl:  0  Allergies  Allergen Reactions  . Benzocaine Other (See Comments)    confusion  . Metformin And Related Diarrhea  . Morphine And Related Itching  . Olmesartan Medoxomil-Hctz Diarrhea    Objective:  Vascular Examination: Capillary refill time immediate x 10 digits.  Dorsalis pedis pulses palpable b/l.  Posterior tibial pulses palpable b/l.  Digital hair absent x 10 digits.  Skin temperature gradient WNL b/l.  Dermatological Examination: Skin with normal turgor, texture and tone b/l.  Toenails 1-5 b/l discolored, thick, dystrophic with subungual debris and pain with palpation to nailbeds due to thickness of nails.  Hyperkeratotic lesion submet head 5 b/l. No erythema, no edema, no drainage, no flocculence noted.   Musculoskeletal: Muscle strength 5/5 to all LE muscle groups.  Hammertoes 2-5 b/l.  HAV with bunion b/l.  Neurological: Sensation intact with 10 gram monofilament.  Vibratory sensation intact.  Assessment: 1. Painful onychomycosis toenails 1-5 b/l 2.   Callus submet head 5 b/l 3.   NIDDM  Plan: 1. Continue diabetic foot care principles. Literature dispensed on today. 2. Toenails 1-5 b/l were debrided in length and girth without iatrogenic bleeding. 3. Hyperkeratotic lesion(s) pared submetatarsal head 5 b/l with sterile scalpel blade without incident. 4. Patient to continue soft, supportive shoe gear daily. 5. Patient to report any pedal injuries to medical professional immediately. 6. Follow up 3 months.  7.  Patient/POA to call should there be a concern in the interim.

## 2018-08-24 ENCOUNTER — Ambulatory Visit (INDEPENDENT_AMBULATORY_CARE_PROVIDER_SITE_OTHER): Payer: Medicare Other | Admitting: Podiatry

## 2018-08-24 ENCOUNTER — Other Ambulatory Visit: Payer: Self-pay

## 2018-08-24 ENCOUNTER — Encounter: Payer: Self-pay | Admitting: Podiatry

## 2018-08-24 DIAGNOSIS — B351 Tinea unguium: Secondary | ICD-10-CM | POA: Diagnosis not present

## 2018-08-24 DIAGNOSIS — E119 Type 2 diabetes mellitus without complications: Secondary | ICD-10-CM | POA: Diagnosis not present

## 2018-08-24 DIAGNOSIS — M79674 Pain in right toe(s): Secondary | ICD-10-CM | POA: Diagnosis not present

## 2018-08-24 DIAGNOSIS — M79675 Pain in left toe(s): Secondary | ICD-10-CM

## 2018-08-24 NOTE — Patient Instructions (Signed)
Diabetes Mellitus and Foot Care Foot care is an important part of your health, especially when you have diabetes. Diabetes may cause you to have problems because of poor blood flow (circulation) to your feet and legs, which can cause your skin to:  Become thinner and drier.  Break more easily.  Heal more slowly.  Peel and crack. You may also have nerve damage (neuropathy) in your legs and feet, causing decreased feeling in them. This means that you may not notice minor injuries to your feet that could lead to more serious problems. Noticing and addressing any potential problems early is the best way to prevent future foot problems. How to care for your feet Foot hygiene  Wash your feet daily with warm water and mild soap. Do not use hot water. Then, pat your feet and the areas between your toes until they are completely dry. Do not soak your feet as this can dry your skin.  Trim your toenails straight across. Do not dig under them or around the cuticle. File the edges of your nails with an emery board or nail file.  Apply a moisturizing lotion or petroleum jelly to the skin on your feet and to dry, brittle toenails. Use lotion that does not contain alcohol and is unscented. Do not apply lotion between your toes. Shoes and socks  Wear clean socks or stockings every day. Make sure they are not too tight. Do not wear knee-high stockings since they may decrease blood flow to your legs.  Wear shoes that fit properly and have enough cushioning. Always look in your shoes before you put them on to be sure there are no objects inside.  To break in new shoes, wear them for just a few hours a day. This prevents injuries on your feet. Wounds, scrapes, corns, and calluses  Check your feet daily for blisters, cuts, bruises, sores, and redness. If you cannot see the bottom of your feet, use a mirror or ask someone for help.  Do not cut corns or calluses or try to remove them with medicine.  If you  find a minor scrape, cut, or break in the skin on your feet, keep it and the skin around it clean and dry. You may clean these areas with mild soap and water. Do not clean the area with peroxide, alcohol, or iodine.  If you have a wound, scrape, corn, or callus on your foot, look at it several times a day to make sure it is healing and not infected. Check for: ? Redness, swelling, or pain. ? Fluid or blood. ? Warmth. ? Pus or a bad smell. General instructions  Do not cross your legs. This may decrease blood flow to your feet.  Do not use heating pads or hot water bottles on your feet. They may burn your skin. If you have lost feeling in your feet or legs, you may not know this is happening until it is too late.  Protect your feet from hot and cold by wearing shoes, such as at the beach or on hot pavement.  Schedule a complete foot exam at least once a year (annually) or more often if you have foot problems. If you have foot problems, report any cuts, sores, or bruises to your health care provider immediately. Contact a health care provider if:  You have a medical condition that increases your risk of infection and you have any cuts, sores, or bruises on your feet.  You have an injury that is not   healing.  You have redness on your legs or feet.  You feel burning or tingling in your legs or feet.  You have pain or cramps in your legs and feet.  Your legs or feet are numb.  Your feet always feel cold.  You have pain around a toenail. Get help right away if:  You have a wound, scrape, corn, or callus on your foot and: ? You have pain, swelling, or redness that gets worse. ? You have fluid or blood coming from the wound, scrape, corn, or callus. ? Your wound, scrape, corn, or callus feels warm to the touch. ? You have pus or a bad smell coming from the wound, scrape, corn, or callus. ? You have a fever. ? You have a red line going up your leg. Summary  Check your feet every day  for cuts, sores, red spots, swelling, and blisters.  Moisturize feet and legs daily.  Wear shoes that fit properly and have enough cushioning.  If you have foot problems, report any cuts, sores, or bruises to your health care provider immediately.  Schedule a complete foot exam at least once a year (annually) or more often if you have foot problems. This information is not intended to replace advice given to you by your health care provider. Make sure you discuss any questions you have with your health care provider. Document Released: 12/21/1999 Document Revised: 02/04/2017 Document Reviewed: 01/25/2016 Elsevier Patient Education  2020 [employer]   Onychomycosis/Fungal Toenails  WHAT IS IT? An infection that lies within the keratin of your nail plate that is caused by a fungus.  WHY ME? Fungal infections affect all ages, sexes, races, and creeds.  There may be many factors that predispose you to a fungal infection such as age, coexisting medical conditions such as diabetes, or an autoimmune disease; stress, medications, fatigue, genetics, etc.  Bottom line: fungus thrives in a warm, moist environment and your shoes offer such a location.  IS IT CONTAGIOUS? Theoretically, yes.  You do not want to share shoes, nail clippers or files with someone who has fungal toenails.  Walking around barefoot in the same room or sleeping in the same bed is unlikely to transfer the organism.  It is important to realize, however, that fungus can spread easily from one nail to the next on the same foot.  HOW DO WE TREAT THIS?  There are several ways to treat this condition.  Treatment may depend on many factors such as age, medications, pregnancy, liver and kidney conditions, etc.  It is best to ask your doctor which options are available to you.  1. No treatment.   Unlike many other medical concerns, you can live with this condition.  However for many people this can be a painful condition and may lead to  ingrown toenails or a bacterial infection.  It is recommended that you keep the nails cut short to help reduce the amount of fungal nail. 2. Topical treatment.  These range from herbal remedies to prescription strength nail lacquers.  About 40-50% effective, topicals require twice daily application for approximately 9 to 12 months or until an entirely new nail has grown out.  The most effective topicals are medical grade medications available through physicians offices. 3. Oral antifungal medications.  With an 80-90% cure rate, the most common oral medication requires 3 to 4 months of therapy and stays in your system for a year as the new nail grows out.  Oral antifungal medications do require   blood work to make sure it is a safe drug for you.  A liver function panel will be performed prior to starting the medication and after the first month of treatment.  It is important to have the blood work performed to avoid any harmful side effects.  In general, this medication safe but blood work is required. 4. Laser Therapy.  This treatment is performed by applying a specialized laser to the affected nail plate.  This therapy is noninvasive, fast, and non-painful.  It is not covered by insurance and is therefore, out of pocket.  The results have been very good with a 80-95% cure rate.  [facility] is the only practice in the area to offer this therapy. 5. Permanent Nail Avulsion.  Removing the entire nail so that a new nail will not grow back. 

## 2018-09-02 NOTE — Progress Notes (Signed)
Subjective: Adrienne Gonzalez is a 85 y.o. y.o. female who presents today for preventative diabetic foot care.  She is seen for painful, discolored, thick toenails and calluses which interfere with daily activities. Pain is aggravated when wearing enclosed shoe gear and relieved with periodic professional debridement.  She has received her diabetic shoes and is pleased with them.  She voices no problems with them.  Harlan Stains, MD is her PCP.   Current Outpatient Medications:  .  amLODipine (NORVASC) 5 MG tablet, Take 1 tablet (5 mg total) by mouth daily., Disp: 30 tablet, Rfl: 0 .  aspirin EC 81 MG tablet, Take 81 mg by mouth daily., Disp: , Rfl:  .  bimatoprost (LUMIGAN) 0.01 % SOLN, Place 1 drop into both eyes at bedtime., Disp: , Rfl:  .  brimonidine (ALPHAGAN P) 0.1 % SOLN, Place 1 drop into both eyes 3 (three) times daily., Disp: , Rfl:  .  cholecalciferol (VITAMIN D3) 25 MCG (1000 UT) tablet, Take 1,000 Units by mouth daily., Disp: , Rfl:  .  colestipol (COLESTID) 1 g tablet, Take 1 tablet (1 g total) by mouth 2 (two) times daily., Disp: 30 tablet, Rfl: 0 .  dorzolamide-timolol (COSOPT) 22.3-6.8 MG/ML ophthalmic solution, Place 1 drop into both eyes 2 (two) times daily., Disp: , Rfl:  .  fluconazole (DIFLUCAN) 100 MG tablet, Take 1 tablet (100 mg total) by mouth daily. (Patient not taking: Reported on 03/02/2018), Disp: 7 tablet, Rfl: 0 .  glipiZIDE (GLUCOTROL) 5 MG tablet, TAKE 1 TABLET BY MOUTH ONCE A DAY BEFORE BREAKFAST IF SUGAR IS ABOVE 200, Disp: , Rfl:  .  metoCLOPramide (REGLAN) 5 MG tablet, Take 1 tablet (5 mg total) by mouth 4 (four) times daily -  before meals and at bedtime., Disp: 90 tablet, Rfl: 0 .  ondansetron (ZOFRAN) 4 MG tablet, TAKE 1 TABLET BY MOUTH 3 TIMES DAILY AS NEEDED FOR NASEA, Disp: , Rfl:  .  pravastatin (PRAVACHOL) 40 MG tablet, Take 40 mg by mouth daily., Disp: , Rfl:  .  saccharomyces boulardii (FLORASTOR) 250 MG capsule, Take 2 capsules (500 mg total) by mouth  2 (two) times daily. (Patient not taking: Reported on 03/02/2018), Disp: 60 capsule, Rfl: 0  Allergies  Allergen Reactions  . Benzocaine Other (See Comments)    confusion  . Metformin And Related Diarrhea  . Morphine And Related Itching  . Olmesartan Medoxomil-Hctz Diarrhea    Objective: There were no vitals filed for this visit.  Vascular Examination: Capillary refill time immediate x10 digits.  Dorsalis pedis pulses remain palpable bilaterally.  Posterior tibial pulses remain palpable bilaterally.  There is no digital hair noted bilaterally.  Skin temperature gradient is within normal limits bilaterally.  Dermatological Examination: Skin with normal turgor, texture and tone b/l.  Toenails 1-5 b/l discolored, thick, dystrophic with subungual debris and pain with palpation to nailbeds due to thickness of nails.  Resolved hyperkeratotic lesion submetatarsal head 5 bilaterally  Hyperkeratotic lesions resolved submetatarsal head 5 bilaterally  Porokeratotic lesions submet with tenderness to palpation. No erythema, no edema, no drainage, no flocculence.   Musculoskeletal: Muscle strength 5/5 to all LE muscle groups.  Hammertoes 2 through 5 bilaterally.  Hallux abductovalgus with bunion deformity bilaterally.  Neurological: Sensation intact 5/5 bilaterally with 10 gram monofilament.  Vibratory sensation intact bilaterally.  Assessment: 1. Painful onychomycosis toenails 1-5 b/l 2.  Callus submetatarsal head 5 bilaterally, resolved with new diabetic shoe gear 3.  NIDDM  Plan: 1. Continue diabetic foot care principles. Literature  dispensed on today. 2. Toenails 1-5 b/l were debrided in length and girth without iatrogenic bleeding. 3. Patient to continue soft, supportive shoe gear daily. 4. Patient to report any pedal injuries to medical professional immediately. 5. Follow up 3 months.  6. Patient/POA to call should there be a concern in the interim.

## 2018-11-23 ENCOUNTER — Ambulatory Visit: Payer: Medicare Other | Admitting: Podiatry

## 2018-11-23 ENCOUNTER — Other Ambulatory Visit: Payer: Self-pay

## 2018-11-23 ENCOUNTER — Encounter: Payer: Self-pay | Admitting: Podiatry

## 2018-11-23 DIAGNOSIS — M79674 Pain in right toe(s): Secondary | ICD-10-CM | POA: Diagnosis not present

## 2018-11-23 DIAGNOSIS — M79675 Pain in left toe(s): Secondary | ICD-10-CM

## 2018-11-23 DIAGNOSIS — L84 Corns and callosities: Secondary | ICD-10-CM

## 2018-11-23 DIAGNOSIS — E119 Type 2 diabetes mellitus without complications: Secondary | ICD-10-CM | POA: Diagnosis not present

## 2018-11-23 DIAGNOSIS — B351 Tinea unguium: Secondary | ICD-10-CM | POA: Diagnosis not present

## 2018-11-23 NOTE — Patient Instructions (Signed)
Diabetes Mellitus and Foot Care Foot care is an important part of your health, especially when you have diabetes. Diabetes may cause you to have problems because of poor blood flow (circulation) to your feet and legs, which can cause your skin to:  Become thinner and drier.  Break more easily.  Heal more slowly.  Peel and crack. You may also have nerve damage (neuropathy) in your legs and feet, causing decreased feeling in them. This means that you may not notice minor injuries to your feet that could lead to more serious problems. Noticing and addressing any potential problems early is the best way to prevent future foot problems. How to care for your feet Foot hygiene  Wash your feet daily with warm water and mild soap. Do not use hot water. Then, pat your feet and the areas between your toes until they are completely dry. Do not soak your feet as this can dry your skin.  Trim your toenails straight across. Do not dig under them or around the cuticle. File the edges of your nails with an emery board or nail file.  Apply a moisturizing lotion or petroleum jelly to the skin on your feet and to dry, brittle toenails. Use lotion that does not contain alcohol and is unscented. Do not apply lotion between your toes. Shoes and socks  Wear clean socks or stockings every day. Make sure they are not too tight. Do not wear knee-high stockings since they may decrease blood flow to your legs.  Wear shoes that fit properly and have enough cushioning. Always look in your shoes before you put them on to be sure there are no objects inside.  To break in new shoes, wear them for just a few hours a day. This prevents injuries on your feet. Wounds, scrapes, corns, and calluses  Check your feet daily for blisters, cuts, bruises, sores, and redness. If you cannot see the bottom of your feet, use a mirror or ask someone for help.  Do not cut corns or calluses or try to remove them with medicine.  If you  find a minor scrape, cut, or break in the skin on your feet, keep it and the skin around it clean and dry. You may clean these areas with mild soap and water. Do not clean the area with peroxide, alcohol, or iodine.  If you have a wound, scrape, corn, or callus on your foot, look at it several times a day to make sure it is healing and not infected. Check for: ? Redness, swelling, or pain. ? Fluid or blood. ? Warmth. ? Pus or a bad smell. General instructions  Do not cross your legs. This may decrease blood flow to your feet.  Do not use heating pads or hot water bottles on your feet. They may burn your skin. If you have lost feeling in your feet or legs, you may not know this is happening until it is too late.  Protect your feet from hot and cold by wearing shoes, such as at the beach or on hot pavement.  Schedule a complete foot exam at least once a year (annually) or more often if you have foot problems. If you have foot problems, report any cuts, sores, or bruises to your health care provider immediately. Contact a health care provider if:  You have a medical condition that increases your risk of infection and you have any cuts, sores, or bruises on your feet.  You have an injury that is not   healing.  You have redness on your legs or feet.  You feel burning or tingling in your legs or feet.  You have pain or cramps in your legs and feet.  Your legs or feet are numb.  Your feet always feel cold.  You have pain around a toenail. Get help right away if:  You have a wound, scrape, corn, or callus on your foot and: ? You have pain, swelling, or redness that gets worse. ? You have fluid or blood coming from the wound, scrape, corn, or callus. ? Your wound, scrape, corn, or callus feels warm to the touch. ? You have pus or a bad smell coming from the wound, scrape, corn, or callus. ? You have a fever. ? You have a red line going up your leg. Summary  Check your feet every day  for cuts, sores, red spots, swelling, and blisters.  Moisturize feet and legs daily.  Wear shoes that fit properly and have enough cushioning.  If you have foot problems, report any cuts, sores, or bruises to your health care provider immediately.  Schedule a complete foot exam at least once a year (annually) or more often if you have foot problems. This information is not intended to replace advice given to you by your health care provider. Make sure you discuss any questions you have with your health care provider. Document Released: 12/21/1999 Document Revised: 02/04/2017 Document Reviewed: 01/25/2016 Elsevier Patient Education  2020 [employer]  

## 2018-11-30 NOTE — Progress Notes (Signed)
Subjective: Adrienne Gonzalez is a 85 y.o. y.o. female who presents today for preventative diabetic foot care with cc of painful, discolored, thick toenails and calluses which interfere with daily activities. Pain is aggravated when wearing enclosed shoe gear and relieved with periodic professional debridement.  Harlan Stains, MD is her PCP.  Medications reviewed in chart.  Allergies  Allergen Reactions  . Benzocaine Other (See Comments)    confusion  . Metformin And Related Diarrhea  . Morphine And Related Itching  . Olmesartan Medoxomil-Hctz Diarrhea   Daughter states she lost her husband on 10/26/2018 and her Mom had been staying with her other child, a son. She is now back at daughter's house.  Objective:  Vascular Examination: Capillary refill time immediate b/l.  Dorsalis pedis pulses palpable b/l.  Posterior tibial pulses palpable b/l.  Digital hair absent b/l.  Skin temperature gradient WNL b/l.  Dermatological Examination: Skin with normal turgor, texture and tone b/l.  Toenails 1-5 b/l discolored, thick, dystrophic with subungual debris and pain with palpation to nailbeds due to thickness of nails.  Hyperkeratotic lesions submetatarsal head 5 b/l. No erythema, no edema, no drainage, no flocculence noted.   Musculoskeletal: Muscle strength 5/5 to all LE muscle groups b/l.  Hammertoes 2-5 b/l.  Neurological: Sensation intact 5/5 b/l with 10 gram monofilament.  Vibratory sensation intact b/l.  Assessment: 1. Painful onychomycosis toenails 1-5 b/l 2.  Calluses submet head 5 b/l 3.  NIDDM  Plan: 1. Continue diabetic foot care principles. Literature dispensed on today. 2. Toenails 1-5 b/l were debrided in length and girth without iatrogenic bleeding. 3. Hyperkeratotic lesions submet head 5 b/l pared with sterile scalpel blade without incident. 4. Patient to continue soft, supportive shoe gear daily. 5. Patient to report any pedal injuries to medical professional  immediately. 6. Follow up 3 months.  7. Patient/POA to call should there be a concern in the interim.

## 2019-02-22 ENCOUNTER — Ambulatory Visit: Payer: Medicare PPO | Admitting: Podiatry

## 2019-02-22 ENCOUNTER — Encounter: Payer: Self-pay | Admitting: Podiatry

## 2019-02-22 ENCOUNTER — Other Ambulatory Visit: Payer: Self-pay

## 2019-02-22 DIAGNOSIS — M79674 Pain in right toe(s): Secondary | ICD-10-CM | POA: Diagnosis not present

## 2019-02-22 DIAGNOSIS — E119 Type 2 diabetes mellitus without complications: Secondary | ICD-10-CM

## 2019-02-22 DIAGNOSIS — M79675 Pain in left toe(s): Secondary | ICD-10-CM

## 2019-02-22 DIAGNOSIS — B351 Tinea unguium: Secondary | ICD-10-CM | POA: Diagnosis not present

## 2019-02-22 DIAGNOSIS — L84 Corns and callosities: Secondary | ICD-10-CM | POA: Diagnosis not present

## 2019-02-22 NOTE — Patient Instructions (Signed)
Diabetes Mellitus and Foot Care Foot care is an important part of your health, especially when you have diabetes. Diabetes may cause you to have problems because of poor blood flow (circulation) to your feet and legs, which can cause your skin to:  Become thinner and drier.  Break more easily.  Heal more slowly.  Peel and crack. You may also have nerve damage (neuropathy) in your legs and feet, causing decreased feeling in them. This means that you may not notice minor injuries to your feet that could lead to more serious problems. Noticing and addressing any potential problems early is the best way to prevent future foot problems. How to care for your feet Foot hygiene  Wash your feet daily with warm water and mild soap. Do not use hot water. Then, pat your feet and the areas between your toes until they are completely dry. Do not soak your feet as this can dry your skin.  Trim your toenails straight across. Do not dig under them or around the cuticle. File the edges of your nails with an emery board or nail file.  Apply a moisturizing lotion or petroleum jelly to the skin on your feet and to dry, brittle toenails. Use lotion that does not contain alcohol and is unscented. Do not apply lotion between your toes. Shoes and socks  Wear clean socks or stockings every day. Make sure they are not too tight. Do not wear knee-high stockings since they may decrease blood flow to your legs.  Wear shoes that fit properly and have enough cushioning. Always look in your shoes before you put them on to be sure there are no objects inside.  To break in new shoes, wear them for just a few hours a day. This prevents injuries on your feet. Wounds, scrapes, corns, and calluses  Check your feet daily for blisters, cuts, bruises, sores, and redness. If you cannot see the bottom of your feet, use a mirror or ask someone for help.  Do not cut corns or calluses or try to remove them with medicine.  If you  find a minor scrape, cut, or break in the skin on your feet, keep it and the skin around it clean and dry. You may clean these areas with mild soap and water. Do not clean the area with peroxide, alcohol, or iodine.  If you have a wound, scrape, corn, or callus on your foot, look at it several times a day to make sure it is healing and not infected. Check for: ? Redness, swelling, or pain. ? Fluid or blood. ? Warmth. ? Pus or a bad smell. General instructions  Do not cross your legs. This may decrease blood flow to your feet.  Do not use heating pads or hot water bottles on your feet. They may burn your skin. If you have lost feeling in your feet or legs, you may not know this is happening until it is too late.  Protect your feet from hot and cold by wearing shoes, such as at the beach or on hot pavement.  Schedule a complete foot exam at least once a year (annually) or more often if you have foot problems. If you have foot problems, report any cuts, sores, or bruises to your health care provider immediately. Contact a health care provider if:  You have a medical condition that increases your risk of infection and you have any cuts, sores, or bruises on your feet.  You have an injury that is not   healing.  You have redness on your legs or feet.  You feel burning or tingling in your legs or feet.  You have pain or cramps in your legs and feet.  Your legs or feet are numb.  Your feet always feel cold.  You have pain around a toenail. Get help right away if:  You have a wound, scrape, corn, or callus on your foot and: ? You have pain, swelling, or redness that gets worse. ? You have fluid or blood coming from the wound, scrape, corn, or callus. ? Your wound, scrape, corn, or callus feels warm to the touch. ? You have pus or a bad smell coming from the wound, scrape, corn, or callus. ? You have a fever. ? You have a red line going up your leg. Summary  Check your feet every day  for cuts, sores, red spots, swelling, and blisters.  Moisturize feet and legs daily.  Wear shoes that fit properly and have enough cushioning.  If you have foot problems, report any cuts, sores, or bruises to your health care provider immediately.  Schedule a complete foot exam at least once a year (annually) or more often if you have foot problems. This information is not intended to replace advice given to you by your health care provider. Make sure you discuss any questions you have with your health care provider. Document Revised: 09/15/2018 Document Reviewed: 01/25/2016 Elsevier Patient Education  2020 [employer]  

## 2019-02-24 NOTE — Progress Notes (Signed)
Subjective: Adrienne Gonzalez presents today for follow up of preventative diabetic foot care and callus(es) b/l and painful mycotic toenails b/l that are difficult to trim. Pain interferes with ambulation. Aggravating factors include wearing enclosed shoe gear. Pain is relieved with periodic professional debridement..   Allergies  Allergen Reactions  . Benzocaine Other (See Comments)    confusion  . Metformin And Related Diarrhea  . Morphine And Related Itching  . Olmesartan Medoxomil-Hctz Diarrhea     Objective: There were no vitals filed for this visit.  Vascular Examination:  Capillary refill time to digits immediate b/l, palpable DP pulses b/l, palpable PT pulses b/l, pedal hair absent b/l and skin temperature gradient within normal limits b/l  Dermatological Examination: Pedal skin with normal turgor, texture and tone bilaterally, no open wounds bilaterally, no interdigital macerations bilaterally, toenails 1-5 b/l elongated, dystrophic, thickened, crumbly with subungual debris and hyperkeratotic lesion(s) submet head 5 b/l and left heel.  No erythema, no edema, no drainage, no flocculence  Musculoskeletal: Normal muscle strength 5/5 to all lower extremity muscle groups bilaterally, no pain crepitus or joint limitation noted with ROM b/l and hammertoes noted to the  2-5 bilaterally  Neurological: Protective sensation intact 5/5 intact bilaterally with 10g monofilament b/l and vibratory sensation intact b/l  Assessment: 1. Pain due to onychomycosis of toenails of both feet   2. Callus   3. Controlled type 2 diabetes mellitus without complication, without long-term current use of insulin (HCC)    Plan: -Continue diabetic foot care principles. Literature dispensed on today.  -Toenails 1-5 b/l were debrided in length and girth without iatrogenic bleeding. -Calluses were debrided without complication or incident. Total number debrided =2, submet head 5 b/l and left heel -Patient to  continue soft, supportive shoe gear daily. -Patient to report any pedal injuries to medical professional immediately. -Patient/POA to call should there be question/concern in the interim.  Return in about 3 months (around 05/22/2019) for diabetic nail trim.

## 2019-03-11 ENCOUNTER — Ambulatory Visit: Payer: Medicare PPO | Attending: Internal Medicine

## 2019-03-11 DIAGNOSIS — Z23 Encounter for immunization: Secondary | ICD-10-CM | POA: Insufficient documentation

## 2019-03-11 NOTE — Progress Notes (Signed)
   Covid-19 Vaccination Clinic  Name:  Adrienne Gonzalez    MRN: [id number] DOB: [birth year]  03/11/2019  [name] was observed post Covid-19 immunization for 15 minutes without incident. She was provided with Vaccine Information Sheet and instruction to access the V-Safe system.   [name] was instructed to call 911 with any severe reactions post vaccine: Marland Kitchen Difficulty breathing  . Swelling of face and throat  . A fast heartbeat  . A bad rash all over body  . Dizziness and weakness   Immunizations Administered    Name Date Dose VIS Date Route   Pfizer COVID-19 Vaccine 03/11/2019  9:52 AM 0.3 mL 12/17/2018 Intramuscular   Manufacturer: [employer], [employer]   Lot: [id number]   NDC: [address]-8

## 2019-04-13 ENCOUNTER — Ambulatory Visit: Payer: Medicare PPO | Attending: Internal Medicine

## 2019-04-13 DIAGNOSIS — Z23 Encounter for immunization: Secondary | ICD-10-CM

## 2019-04-13 NOTE — Progress Notes (Signed)
   Covid-19 Vaccination Clinic  Name:  Adrienne Gonzalez    MRN: [id number] DOB: 1925-01-23  04/13/2019  [name] was observed post Covid-19 immunization for 15 minutes without incident. She was provided with Vaccine Information Sheet and instruction to access the V-Safe system.   [name] was instructed to call 911 with any severe reactions post vaccine: Marland Kitchen Difficulty breathing  . Swelling of face and throat  . A fast heartbeat  . A bad rash all over body  . Dizziness and weakness   Immunizations Administered    Name Date Dose VIS Date Route   Pfizer COVID-19 Vaccine 04/13/2019  1:42 PM 0.3 mL 12/17/2018 Intramuscular   Manufacturer: [employer], [employer]   Lot: [id number]   NDC: [address]-0

## 2019-05-24 ENCOUNTER — Ambulatory Visit: Payer: Medicare PPO | Admitting: Podiatry

## 2019-07-19 ENCOUNTER — Other Ambulatory Visit: Payer: Self-pay

## 2019-07-19 ENCOUNTER — Encounter: Payer: Self-pay | Admitting: Podiatry

## 2019-07-19 ENCOUNTER — Ambulatory Visit: Payer: Medicare PPO | Admitting: Podiatry

## 2019-07-19 DIAGNOSIS — B351 Tinea unguium: Secondary | ICD-10-CM

## 2019-07-19 DIAGNOSIS — M79674 Pain in right toe(s): Secondary | ICD-10-CM

## 2019-07-19 DIAGNOSIS — L84 Corns and callosities: Secondary | ICD-10-CM | POA: Diagnosis not present

## 2019-07-19 DIAGNOSIS — E119 Type 2 diabetes mellitus without complications: Secondary | ICD-10-CM

## 2019-07-19 DIAGNOSIS — M79675 Pain in left toe(s): Secondary | ICD-10-CM

## 2019-07-23 NOTE — Progress Notes (Signed)
Subjective: Adrienne Gonzalez presents today for follow up of preventative diabetic foot care and callus(es) b/l and painful mycotic toenails b/l that are difficult to trim. Pain interferes with ambulation. Aggravating factors include wearing enclosed shoe gear. Pain is relieved with periodic professional debridement..  She voices no new pedal problems on today's visit.   Last A1c was 6.4%.  Allergies  Allergen Reactions  . Benzocaine Other (See Comments)    confusion  . Metformin And Related Diarrhea  . Morphine And Related Itching  . Olmesartan Medoxomil-Hctz Diarrhea     Objective: There were no vitals filed for this visit. No change in physical examination on today's visit. Vascular Examination:  Capillary refill time to digits immediate b/l, palpable DP pulses b/l, palpable PT pulses b/l, pedal hair absent b/l and skin temperature gradient within normal limits b/l  Dermatological Examination: Pedal skin with normal turgor, texture and tone bilaterally, no open wounds bilaterally, no interdigital macerations bilaterally, toenails 1-5 b/l elongated, dystrophic, thickened, crumbly with subungual debris and hyperkeratotic lesion(s) submet head 5 b/l and left heel.  No erythema, no edema, no drainage, no flocculence  Musculoskeletal: Normal muscle strength 5/5 to all lower extremity muscle groups bilaterally, no pain crepitus or joint limitation noted with ROM b/l and hammertoes noted to the  2-5 bilaterally  Neurological: Protective sensation intact 5/5 intact bilaterally with 10g monofilament b/l and vibratory sensation intact b/l  Assessment: 1. Pain due to onychomycosis of toenails of both feet   2. Callus   3. Controlled type 2 diabetes mellitus without complication, without long-term current use of insulin (HCC)    Plan: -Examined patient. -No new findings. No new orders. -Continue diabetic foot care principles. -Toenails 1-5 b/l were debrided in length and girth with sterile  nail nippers and dremel without iatrogenic bleeding.  -Callus(es) submet head 5 left foot, submet head 5 right foot and left heel pared utilizing sterile scalpel blade without complication or incident. Total number debrided =3. -Patient to report any pedal injuries to medical professional immediately. -Patient to continue soft, supportive shoe gear daily. -Patient/POA to call should there be question/concern in the interim.  Return in about 3 months (around 10/19/2019) for diabetic nail and callus trim.

## 2019-07-27 ENCOUNTER — Encounter (INDEPENDENT_AMBULATORY_CARE_PROVIDER_SITE_OTHER): Payer: Self-pay | Admitting: Ophthalmology

## 2019-07-27 ENCOUNTER — Ambulatory Visit (INDEPENDENT_AMBULATORY_CARE_PROVIDER_SITE_OTHER): Payer: Medicare PPO | Admitting: Ophthalmology

## 2019-07-27 ENCOUNTER — Other Ambulatory Visit: Payer: Self-pay

## 2019-07-27 DIAGNOSIS — E119 Type 2 diabetes mellitus without complications: Secondary | ICD-10-CM | POA: Diagnosis not present

## 2019-07-27 DIAGNOSIS — E113393 Type 2 diabetes mellitus with moderate nonproliferative diabetic retinopathy without macular edema, bilateral: Secondary | ICD-10-CM | POA: Diagnosis not present

## 2019-07-27 DIAGNOSIS — H35371 Puckering of macula, right eye: Secondary | ICD-10-CM

## 2019-07-27 DIAGNOSIS — H35372 Puckering of macula, left eye: Secondary | ICD-10-CM | POA: Insufficient documentation

## 2019-07-27 DIAGNOSIS — H401133 Primary open-angle glaucoma, bilateral, severe stage: Secondary | ICD-10-CM | POA: Diagnosis not present

## 2019-07-27 NOTE — Progress Notes (Signed)
07/27/2019     CHIEF COMPLAINT Patient presents for Retina Follow Up   HISTORY OF PRESENT ILLNESS: Adrienne Gonzalez is a 86 y.o. female who presents to the clinic today for:   HPI    Retina Follow Up    Patient presents with  Diabetic Retinopathy.  In both eyes.  This started 4 years ago.  Severity is mild.  Duration of 4 years.  Since onset it is stable.          Comments    Refer-back by Dr. Venetia Maxon - Diabetic Retinopathy OU, ERM OU  Pt c/o decreasing VA OS x 2 years. Pt denies ocular pain OU. Pt sts VA OD is stable.  A1c: does not recall, "I think it's 7" LBS: 150 this AM  Patient is sent for evaluation to determine if visual loss is retina or potentially glaucoma in origin  Past ocular history disclosed significant macular and retinal and RNFL atrophy on previous OCT last taking here April 2017.  Distorting epiretinal membrane noted OD, and OS with diffuse retinal atrophy, cup-to-disc ratio of 0.9 OD and 0.9-95 OS at that time.  Pallor was also noted to the optic nerve at that time.         Last edited by Hurman Horn, MD on 07/27/2019  3:02 PM. (History)      Referring physician: Harlan Stains, MD [address] [place],  [place] [address]  HISTORICAL INFORMATION:   Selected notes from the MEDICAL RECORD NUMBER    Lab Results  Component Value Date   HGBA1C 6.4 (H) 01/10/2018     CURRENT MEDICATIONS: Current Outpatient Medications (Ophthalmic Drugs)  Medication Sig  . brimonidine (ALPHAGAN P) 0.1 % SOLN Place 1 drop into both eyes 3 (three) times daily.  . dorzolamide-timolol (COSOPT) 22.3-6.8 MG/ML ophthalmic solution Place 1 drop into both eyes 2 (two) times daily.  Marland Kitchen VYZULTA 0.024 % SOLN   . bimatoprost (LUMIGAN) 0.01 % SOLN Place 1 drop into both eyes at bedtime. (Patient not taking: Reported on 07/27/2019)   No current facility-administered medications for this visit. (Ophthalmic Drugs)   Current Outpatient Medications (Other)  Medication  Sig  . ACCU-CHEK GUIDE test strip   . Accu-Chek Softclix Lancets lancets   . amLODipine (NORVASC) 5 MG tablet Take 1 tablet (5 mg total) by mouth daily.  Marland Kitchen aspirin EC 81 MG tablet Take 81 mg by mouth daily.  . Blood Glucose Monitoring Suppl (ACCU-CHEK GUIDE) w/Device KIT   . cholecalciferol (VITAMIN D3) 25 MCG (1000 UT) tablet Take 1,000 Units by mouth daily.  . colestipol (COLESTID) 1 g tablet Take 1 tablet (1 g total) by mouth 2 (two) times daily.  Marland Kitchen glipiZIDE (GLUCOTROL) 5 MG tablet TAKE 1 TABLET BY MOUTH ONCE A DAY BEFORE BREAKFAST IF SUGAR IS ABOVE 200  . HUMALOG MIX 75/25 KWIKPEN (75-25) 100 UNIT/ML Kwikpen SMARTSIG:16 Unit(s) SUB-Q Every Morning  . metoCLOPramide (REGLAN) 5 MG tablet Take 1 tablet (5 mg total) by mouth 4 (four) times daily -  before meals and at bedtime.  Marland Kitchen NOVOLOG MIX 70/30 FLEXPEN (70-30) 100 UNIT/ML FlexPen   . pravastatin (PRAVACHOL) 40 MG tablet Take 40 mg by mouth daily.   No current facility-administered medications for this visit. (Other)      REVIEW OF SYSTEMS:    ALLERGIES Allergies  Allergen Reactions  . Benzocaine Other (See Comments)    confusion  . Metformin And Related Diarrhea  . Morphine And Related Itching  .  Olmesartan Medoxomil-Hctz Diarrhea    PAST MEDICAL HISTORY Past Medical History:  Diagnosis Date  . Chronic renal insufficiency, stage 1   . Diabetes mellitus without complication ([place])   . Hypertension    Past Surgical History:  Procedure Laterality Date  . BIOPSY  01/19/2018   Procedure: BIOPSY;  Surgeon: Otis Brace, MD;  Location: [facility];  Service: Gastroenterology;;  . Otho Darner SIGMOIDOSCOPY N/A 01/19/2018   Procedure: FLEXIBLE SIGMOIDOSCOPY;  Surgeon: Otis Brace, MD;  Location: [facility];  Service: Gastroenterology;  Laterality: N/A;    FAMILY HISTORY History reviewed. No pertinent family history.  SOCIAL HISTORY Social History   Tobacco Use  . Smoking status: Never Smoker  . Smokeless  tobacco: Never Used  Vaping Use  . Vaping Use: Never used  Substance Use Topics  . Alcohol use: Never  . Drug use: Never         OPHTHALMIC EXAM:  Base Eye Exam    Visual Acuity (ETDRS)      Right Left   Dist cc CF at 1' HM   Dist ph cc NI NI   Correction: Glasses       Tonometry (Tonopen, 1:56 PM)      Right Left   Pressure 23 20       Pupils      Dark Light Shape React APD   Right 2 2 Irregular Minimal None   Left 2 2 Round Minimal None       Visual Fields (Counting fingers)      Left Right   Restrictions Total superior temporal, inferior temporal, superior nasal, inferior nasal deficiencies Total superior temporal, inferior temporal, superior nasal, inferior nasal deficiencies       Extraocular Movement      Right Left    Full Full       Neuro/Psych    Oriented x3: Yes   Mood/Affect: Normal       Dilation    Both eyes: 1.0% Mydriacyl, 2.5% Phenylephrine @ 1:56 PM        Slit Lamp and Fundus Exam    Slit Lamp Exam      Right Left   Lids/Lashes Normal Normal   Conjunctiva/Sclera White and quiet White and quiet   Cornea Clear Clear   Anterior Chamber Deep and quiet Deep and quiet   Iris Lites poorly, old posterior synechiae Lites poorly, old posterior synechiae   Lens Posterior chamber intraocular lens, Centered posterior chamber intraocular lens Posterior chamber intraocular lens, Centered posterior chamber intraocular lens   Anterior Vitreous Normal Normal       Fundus Exam      Right Left   Posterior Vitreous Clear media Clear media   Disc 4+ Optic disc atrophy, 4+ Pallor 4+ Optic disc atrophy, 4+ Pallor   C/D Ratio 0.95 -1.0 0.95 -1.0   Macula Normal Normal   Vessels Normal Normal   Periphery Normal, 28 diopter and 90 diopter exam Normal, 28 diopter and 90 diopter exam          IMAGING AND PROCEDURES  Imaging and Procedures for 07/27/19  OCT, Retina - OU - Both Eyes       Right Eye Quality was borderline. Scan locations included  subfoveal. Progression has no prior data. Findings include central retinal atrophy, outer retinal atrophy, inner retinal atrophy.   Left Eye Quality was borderline. Progression has no prior data. Findings include central retinal atrophy, outer retinal atrophy, inner retinal atrophy.   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 Right epiretinal membrane  H35.371 OCT, Retina - OU - Both Eyes  2. Primary open angle glaucoma of both eyes, severe stage  H40.1133   3. Moderate nonproliferative diabetic retinopathy of both eyes without macular edema associated with type 2 diabetes mellitus ([place])  K80.0349   4. Diabetes mellitus without complication ([place])  Z79.1     1.  Currently there is no retinal cause for this poor advanced visual acuity.  Explained her that the advanced atrophy in the papillomacular bundle OU, from longstanding glaucoma likely accounts for fixation loss OU in addition to the widespread peripheral vision losses.  2.  [place] up with Dr. Venetia Maxon as scheduled.  3.  Ophthalmic Meds Ordered this visit:  No orders of the defined types were placed in this encounter.      Return in about 2 years (around 07/26/2021) for DILATE OU, OCT.  There are no Patient Instructions on file for this visit.   Explained the diagnoses, plan, and follow up with the patient and they expressed understanding.  Patient expressed understanding of the importance of proper follow up care.   Clent Demark Shannie Kontos M.D. Diseases & Surgery of the Retina and Vitreous [facility] [place] 07/27/19     Abbreviations: M myopia (nearsighted); A astigmatism; H hyperopia (farsighted); P presbyopia;  Mrx spectacle prescription;  CTL contact lenses; OD right eye; OS left eye; OU both eyes  XT exotropia; ET esotropia; PEK punctate epithelial keratitis; PEE punctate epithelial erosions; DES dry eye syndrome; MGD meibomian gland dysfunction; ATs artificial tears; PFAT's preservative free artificial tears; [place] nuclear sclerotic cataract; PSC posterior subcapsular cataract; ERM epi-retinal membrane; PVD posterior vitreous detachment; RD retinal detachment; DM diabetes mellitus; DR diabetic retinopathy; NPDR non-proliferative diabetic retinopathy; PDR proliferative diabetic retinopathy; CSME clinically significant macular edema; DME diabetic macular edema; dbh dot blot hemorrhages; CWS cotton wool spot; POAG primary open angle glaucoma; C/D cup-to-disc ratio; HVF humphrey visual field; GVF goldmann visual field; OCT optical coherence tomography; IOP intraocular pressure; BRVO Branch retinal vein occlusion; CRVO central retinal vein occlusion; CRAO central retinal artery occlusion; BRAO branch retinal artery occlusion; RT retinal tear; SB scleral buckle; PPV pars plana vitrectomy; VH Vitreous hemorrhage; PRP panretinal laser photocoagulation; IVK intravitreal kenalog; VMT vitreomacular traction; MH Macular hole;  NVD neovascularization of the disc; NVE neovascularization elsewhere; AREDS age related eye disease study; ARMD age related macular degeneration; POAG primary open angle glaucoma; EBMD epithelial/anterior basement membrane dystrophy; ACIOL anterior chamber intraocular lens; IOL intraocular lens; PCIOL posterior chamber intraocular lens; Phaco/IOL phacoemulsification with intraocular lens placement; [place] photorefractive keratectomy; LASIK laser assisted in situ keratomileusis; HTN hypertension; DM diabetes mellitus; COPD chronic obstructive pulmonary disease

## 2019-07-27 NOTE — Assessment & Plan Note (Signed)
Minor OD, in the past with no topo distortion

## 2019-07-27 NOTE — Assessment & Plan Note (Signed)
No detectable diabetic retinopathy as diffuse optic nerve pallor from advanced glaucoma likely prevents this from ever developing

## 2019-07-28 DIAGNOSIS — E1122 Type 2 diabetes mellitus with diabetic chronic kidney disease: Secondary | ICD-10-CM | POA: Diagnosis not present

## 2019-07-28 DIAGNOSIS — G20 Parkinson's disease: Secondary | ICD-10-CM | POA: Diagnosis not present

## 2019-07-28 DIAGNOSIS — N183 Chronic kidney disease, stage 3 unspecified: Secondary | ICD-10-CM | POA: Diagnosis not present

## 2019-07-28 DIAGNOSIS — E440 Moderate protein-calorie malnutrition: Secondary | ICD-10-CM | POA: Diagnosis not present

## 2019-07-28 DIAGNOSIS — E11319 Type 2 diabetes mellitus with unspecified diabetic retinopathy without macular edema: Secondary | ICD-10-CM | POA: Diagnosis not present

## 2019-07-28 DIAGNOSIS — I129 Hypertensive chronic kidney disease with stage 1 through stage 4 chronic kidney disease, or unspecified chronic kidney disease: Secondary | ICD-10-CM | POA: Diagnosis not present

## 2019-07-28 DIAGNOSIS — F0280 Dementia in other diseases classified elsewhere without behavioral disturbance: Secondary | ICD-10-CM | POA: Diagnosis not present

## 2019-07-28 DIAGNOSIS — E785 Hyperlipidemia, unspecified: Secondary | ICD-10-CM | POA: Diagnosis not present

## 2019-07-28 DIAGNOSIS — E559 Vitamin D deficiency, unspecified: Secondary | ICD-10-CM | POA: Diagnosis not present

## 2019-08-02 ENCOUNTER — Ambulatory Visit: Payer: Medicare PPO | Admitting: Orthotics

## 2019-08-02 ENCOUNTER — Other Ambulatory Visit: Payer: Self-pay

## 2019-08-02 DIAGNOSIS — N183 Chronic kidney disease, stage 3 unspecified: Secondary | ICD-10-CM | POA: Diagnosis not present

## 2019-08-02 DIAGNOSIS — E119 Type 2 diabetes mellitus without complications: Secondary | ICD-10-CM

## 2019-08-02 DIAGNOSIS — F0280 Dementia in other diseases classified elsewhere without behavioral disturbance: Secondary | ICD-10-CM | POA: Diagnosis not present

## 2019-08-02 DIAGNOSIS — E11319 Type 2 diabetes mellitus with unspecified diabetic retinopathy without macular edema: Secondary | ICD-10-CM | POA: Diagnosis not present

## 2019-08-02 DIAGNOSIS — E440 Moderate protein-calorie malnutrition: Secondary | ICD-10-CM | POA: Diagnosis not present

## 2019-08-02 DIAGNOSIS — M79675 Pain in left toe(s): Secondary | ICD-10-CM

## 2019-08-02 DIAGNOSIS — E1122 Type 2 diabetes mellitus with diabetic chronic kidney disease: Secondary | ICD-10-CM | POA: Diagnosis not present

## 2019-08-02 DIAGNOSIS — L84 Corns and callosities: Secondary | ICD-10-CM

## 2019-08-02 DIAGNOSIS — E559 Vitamin D deficiency, unspecified: Secondary | ICD-10-CM | POA: Diagnosis not present

## 2019-08-02 DIAGNOSIS — E785 Hyperlipidemia, unspecified: Secondary | ICD-10-CM | POA: Diagnosis not present

## 2019-08-02 DIAGNOSIS — G20 Parkinson's disease: Secondary | ICD-10-CM | POA: Diagnosis not present

## 2019-08-02 DIAGNOSIS — I129 Hypertensive chronic kidney disease with stage 1 through stage 4 chronic kidney disease, or unspecified chronic kidney disease: Secondary | ICD-10-CM | POA: Diagnosis not present

## 2019-08-02 NOTE — Progress Notes (Signed)

## 2019-08-04 DIAGNOSIS — H409 Unspecified glaucoma: Secondary | ICD-10-CM | POA: Diagnosis not present

## 2019-08-04 DIAGNOSIS — F0390 Unspecified dementia without behavioral disturbance: Secondary | ICD-10-CM | POA: Diagnosis not present

## 2019-08-04 DIAGNOSIS — F0280 Dementia in other diseases classified elsewhere without behavioral disturbance: Secondary | ICD-10-CM | POA: Diagnosis not present

## 2019-08-04 DIAGNOSIS — E559 Vitamin D deficiency, unspecified: Secondary | ICD-10-CM | POA: Diagnosis not present

## 2019-08-04 DIAGNOSIS — Z885 Allergy status to narcotic agent status: Secondary | ICD-10-CM | POA: Diagnosis not present

## 2019-08-04 DIAGNOSIS — Z7982 Long term (current) use of aspirin: Secondary | ICD-10-CM | POA: Diagnosis not present

## 2019-08-04 DIAGNOSIS — G20 Parkinson's disease: Secondary | ICD-10-CM | POA: Diagnosis not present

## 2019-08-04 DIAGNOSIS — E119 Type 2 diabetes mellitus without complications: Secondary | ICD-10-CM | POA: Diagnosis not present

## 2019-08-04 DIAGNOSIS — R269 Unspecified abnormalities of gait and mobility: Secondary | ICD-10-CM | POA: Diagnosis not present

## 2019-08-04 DIAGNOSIS — Z888 Allergy status to other drugs, medicaments and biological substances status: Secondary | ICD-10-CM | POA: Diagnosis not present

## 2019-08-04 DIAGNOSIS — I10 Essential (primary) hypertension: Secondary | ICD-10-CM | POA: Diagnosis not present

## 2019-08-04 DIAGNOSIS — Z794 Long term (current) use of insulin: Secondary | ICD-10-CM | POA: Diagnosis not present

## 2019-08-04 DIAGNOSIS — R32 Unspecified urinary incontinence: Secondary | ICD-10-CM | POA: Diagnosis not present

## 2019-08-04 DIAGNOSIS — Z87891 Personal history of nicotine dependence: Secondary | ICD-10-CM | POA: Diagnosis not present

## 2019-08-04 DIAGNOSIS — E1122 Type 2 diabetes mellitus with diabetic chronic kidney disease: Secondary | ICD-10-CM | POA: Diagnosis not present

## 2019-08-04 DIAGNOSIS — E785 Hyperlipidemia, unspecified: Secondary | ICD-10-CM | POA: Diagnosis not present

## 2019-08-04 DIAGNOSIS — I129 Hypertensive chronic kidney disease with stage 1 through stage 4 chronic kidney disease, or unspecified chronic kidney disease: Secondary | ICD-10-CM | POA: Diagnosis not present

## 2019-08-04 DIAGNOSIS — N183 Chronic kidney disease, stage 3 unspecified: Secondary | ICD-10-CM | POA: Diagnosis not present

## 2019-08-04 DIAGNOSIS — E11319 Type 2 diabetes mellitus with unspecified diabetic retinopathy without macular edema: Secondary | ICD-10-CM | POA: Diagnosis not present

## 2019-08-04 DIAGNOSIS — E440 Moderate protein-calorie malnutrition: Secondary | ICD-10-CM | POA: Diagnosis not present

## 2019-08-09 DIAGNOSIS — H401133 Primary open-angle glaucoma, bilateral, severe stage: Secondary | ICD-10-CM | POA: Diagnosis not present

## 2019-08-09 DIAGNOSIS — E785 Hyperlipidemia, unspecified: Secondary | ICD-10-CM | POA: Diagnosis not present

## 2019-08-09 DIAGNOSIS — I129 Hypertensive chronic kidney disease with stage 1 through stage 4 chronic kidney disease, or unspecified chronic kidney disease: Secondary | ICD-10-CM | POA: Diagnosis not present

## 2019-08-09 DIAGNOSIS — E1122 Type 2 diabetes mellitus with diabetic chronic kidney disease: Secondary | ICD-10-CM | POA: Diagnosis not present

## 2019-08-09 DIAGNOSIS — G20 Parkinson's disease: Secondary | ICD-10-CM | POA: Diagnosis not present

## 2019-08-09 DIAGNOSIS — N183 Chronic kidney disease, stage 3 unspecified: Secondary | ICD-10-CM | POA: Diagnosis not present

## 2019-08-09 DIAGNOSIS — E11319 Type 2 diabetes mellitus with unspecified diabetic retinopathy without macular edema: Secondary | ICD-10-CM | POA: Diagnosis not present

## 2019-08-09 DIAGNOSIS — E440 Moderate protein-calorie malnutrition: Secondary | ICD-10-CM | POA: Diagnosis not present

## 2019-08-09 DIAGNOSIS — F0280 Dementia in other diseases classified elsewhere without behavioral disturbance: Secondary | ICD-10-CM | POA: Diagnosis not present

## 2019-08-09 DIAGNOSIS — E559 Vitamin D deficiency, unspecified: Secondary | ICD-10-CM | POA: Diagnosis not present

## 2019-08-12 DIAGNOSIS — E559 Vitamin D deficiency, unspecified: Secondary | ICD-10-CM | POA: Diagnosis not present

## 2019-08-12 DIAGNOSIS — E785 Hyperlipidemia, unspecified: Secondary | ICD-10-CM | POA: Diagnosis not present

## 2019-08-12 DIAGNOSIS — G20 Parkinson's disease: Secondary | ICD-10-CM | POA: Diagnosis not present

## 2019-08-12 DIAGNOSIS — F0280 Dementia in other diseases classified elsewhere without behavioral disturbance: Secondary | ICD-10-CM | POA: Diagnosis not present

## 2019-08-12 DIAGNOSIS — E11319 Type 2 diabetes mellitus with unspecified diabetic retinopathy without macular edema: Secondary | ICD-10-CM | POA: Diagnosis not present

## 2019-08-12 DIAGNOSIS — E440 Moderate protein-calorie malnutrition: Secondary | ICD-10-CM | POA: Diagnosis not present

## 2019-08-12 DIAGNOSIS — N183 Chronic kidney disease, stage 3 unspecified: Secondary | ICD-10-CM | POA: Diagnosis not present

## 2019-08-12 DIAGNOSIS — I129 Hypertensive chronic kidney disease with stage 1 through stage 4 chronic kidney disease, or unspecified chronic kidney disease: Secondary | ICD-10-CM | POA: Diagnosis not present

## 2019-08-12 DIAGNOSIS — E1122 Type 2 diabetes mellitus with diabetic chronic kidney disease: Secondary | ICD-10-CM | POA: Diagnosis not present

## 2019-08-17 DIAGNOSIS — E559 Vitamin D deficiency, unspecified: Secondary | ICD-10-CM | POA: Diagnosis not present

## 2019-08-17 DIAGNOSIS — E11319 Type 2 diabetes mellitus with unspecified diabetic retinopathy without macular edema: Secondary | ICD-10-CM | POA: Diagnosis not present

## 2019-08-17 DIAGNOSIS — I129 Hypertensive chronic kidney disease with stage 1 through stage 4 chronic kidney disease, or unspecified chronic kidney disease: Secondary | ICD-10-CM | POA: Diagnosis not present

## 2019-08-17 DIAGNOSIS — G20 Parkinson's disease: Secondary | ICD-10-CM | POA: Diagnosis not present

## 2019-08-17 DIAGNOSIS — F0280 Dementia in other diseases classified elsewhere without behavioral disturbance: Secondary | ICD-10-CM | POA: Diagnosis not present

## 2019-08-17 DIAGNOSIS — E1122 Type 2 diabetes mellitus with diabetic chronic kidney disease: Secondary | ICD-10-CM | POA: Diagnosis not present

## 2019-08-17 DIAGNOSIS — E785 Hyperlipidemia, unspecified: Secondary | ICD-10-CM | POA: Diagnosis not present

## 2019-08-17 DIAGNOSIS — N183 Chronic kidney disease, stage 3 unspecified: Secondary | ICD-10-CM | POA: Diagnosis not present

## 2019-08-17 DIAGNOSIS — E440 Moderate protein-calorie malnutrition: Secondary | ICD-10-CM | POA: Diagnosis not present

## 2019-08-26 DIAGNOSIS — E1122 Type 2 diabetes mellitus with diabetic chronic kidney disease: Secondary | ICD-10-CM | POA: Diagnosis not present

## 2019-08-26 DIAGNOSIS — E440 Moderate protein-calorie malnutrition: Secondary | ICD-10-CM | POA: Diagnosis not present

## 2019-08-26 DIAGNOSIS — N183 Chronic kidney disease, stage 3 unspecified: Secondary | ICD-10-CM | POA: Diagnosis not present

## 2019-08-26 DIAGNOSIS — E11319 Type 2 diabetes mellitus with unspecified diabetic retinopathy without macular edema: Secondary | ICD-10-CM | POA: Diagnosis not present

## 2019-08-26 DIAGNOSIS — F0280 Dementia in other diseases classified elsewhere without behavioral disturbance: Secondary | ICD-10-CM | POA: Diagnosis not present

## 2019-08-26 DIAGNOSIS — I129 Hypertensive chronic kidney disease with stage 1 through stage 4 chronic kidney disease, or unspecified chronic kidney disease: Secondary | ICD-10-CM | POA: Diagnosis not present

## 2019-08-26 DIAGNOSIS — E785 Hyperlipidemia, unspecified: Secondary | ICD-10-CM | POA: Diagnosis not present

## 2019-08-26 DIAGNOSIS — E559 Vitamin D deficiency, unspecified: Secondary | ICD-10-CM | POA: Diagnosis not present

## 2019-08-26 DIAGNOSIS — G20 Parkinson's disease: Secondary | ICD-10-CM | POA: Diagnosis not present

## 2019-09-01 DIAGNOSIS — E1121 Type 2 diabetes mellitus with diabetic nephropathy: Secondary | ICD-10-CM | POA: Diagnosis not present

## 2019-09-01 DIAGNOSIS — N183 Chronic kidney disease, stage 3 unspecified: Secondary | ICD-10-CM | POA: Diagnosis not present

## 2019-09-01 DIAGNOSIS — E559 Vitamin D deficiency, unspecified: Secondary | ICD-10-CM | POA: Diagnosis not present

## 2019-09-01 DIAGNOSIS — E1139 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: Secondary | ICD-10-CM | POA: Diagnosis not present

## 2019-09-01 DIAGNOSIS — I129 Hypertensive chronic kidney disease with stage 1 through stage 4 chronic kidney disease, or unspecified chronic kidney disease: Secondary | ICD-10-CM | POA: Diagnosis not present

## 2019-09-01 DIAGNOSIS — E785 Hyperlipidemia, unspecified: Secondary | ICD-10-CM | POA: Diagnosis not present

## 2019-09-03 DIAGNOSIS — F0280 Dementia in other diseases classified elsewhere without behavioral disturbance: Secondary | ICD-10-CM | POA: Diagnosis not present

## 2019-09-03 DIAGNOSIS — E559 Vitamin D deficiency, unspecified: Secondary | ICD-10-CM | POA: Diagnosis not present

## 2019-09-03 DIAGNOSIS — E785 Hyperlipidemia, unspecified: Secondary | ICD-10-CM | POA: Diagnosis not present

## 2019-09-03 DIAGNOSIS — E440 Moderate protein-calorie malnutrition: Secondary | ICD-10-CM | POA: Diagnosis not present

## 2019-09-03 DIAGNOSIS — G20 Parkinson's disease: Secondary | ICD-10-CM | POA: Diagnosis not present

## 2019-09-03 DIAGNOSIS — E11319 Type 2 diabetes mellitus with unspecified diabetic retinopathy without macular edema: Secondary | ICD-10-CM | POA: Diagnosis not present

## 2019-09-03 DIAGNOSIS — I129 Hypertensive chronic kidney disease with stage 1 through stage 4 chronic kidney disease, or unspecified chronic kidney disease: Secondary | ICD-10-CM | POA: Diagnosis not present

## 2019-09-03 DIAGNOSIS — E1122 Type 2 diabetes mellitus with diabetic chronic kidney disease: Secondary | ICD-10-CM | POA: Diagnosis not present

## 2019-09-03 DIAGNOSIS — N183 Chronic kidney disease, stage 3 unspecified: Secondary | ICD-10-CM | POA: Diagnosis not present

## 2019-09-07 DIAGNOSIS — E559 Vitamin D deficiency, unspecified: Secondary | ICD-10-CM | POA: Diagnosis not present

## 2019-09-07 DIAGNOSIS — F0280 Dementia in other diseases classified elsewhere without behavioral disturbance: Secondary | ICD-10-CM | POA: Diagnosis not present

## 2019-09-07 DIAGNOSIS — N183 Chronic kidney disease, stage 3 unspecified: Secondary | ICD-10-CM | POA: Diagnosis not present

## 2019-09-07 DIAGNOSIS — E440 Moderate protein-calorie malnutrition: Secondary | ICD-10-CM | POA: Diagnosis not present

## 2019-09-07 DIAGNOSIS — E11319 Type 2 diabetes mellitus with unspecified diabetic retinopathy without macular edema: Secondary | ICD-10-CM | POA: Diagnosis not present

## 2019-09-07 DIAGNOSIS — I129 Hypertensive chronic kidney disease with stage 1 through stage 4 chronic kidney disease, or unspecified chronic kidney disease: Secondary | ICD-10-CM | POA: Diagnosis not present

## 2019-09-07 DIAGNOSIS — E785 Hyperlipidemia, unspecified: Secondary | ICD-10-CM | POA: Diagnosis not present

## 2019-09-07 DIAGNOSIS — G20 Parkinson's disease: Secondary | ICD-10-CM | POA: Diagnosis not present

## 2019-09-07 DIAGNOSIS — E1122 Type 2 diabetes mellitus with diabetic chronic kidney disease: Secondary | ICD-10-CM | POA: Diagnosis not present

## 2019-09-16 ENCOUNTER — Ambulatory Visit: Payer: Medicare PPO | Admitting: Orthotics

## 2019-09-16 ENCOUNTER — Other Ambulatory Visit: Payer: Self-pay

## 2019-09-16 DIAGNOSIS — N183 Chronic kidney disease, stage 3 unspecified: Secondary | ICD-10-CM | POA: Diagnosis not present

## 2019-09-16 DIAGNOSIS — G20 Parkinson's disease: Secondary | ICD-10-CM | POA: Diagnosis not present

## 2019-09-16 DIAGNOSIS — E11319 Type 2 diabetes mellitus with unspecified diabetic retinopathy without macular edema: Secondary | ICD-10-CM | POA: Diagnosis not present

## 2019-09-16 DIAGNOSIS — E1122 Type 2 diabetes mellitus with diabetic chronic kidney disease: Secondary | ICD-10-CM | POA: Diagnosis not present

## 2019-09-16 DIAGNOSIS — M2041 Other hammer toe(s) (acquired), right foot: Secondary | ICD-10-CM | POA: Diagnosis not present

## 2019-09-16 DIAGNOSIS — E785 Hyperlipidemia, unspecified: Secondary | ICD-10-CM | POA: Diagnosis not present

## 2019-09-16 DIAGNOSIS — E559 Vitamin D deficiency, unspecified: Secondary | ICD-10-CM | POA: Diagnosis not present

## 2019-09-16 DIAGNOSIS — L84 Corns and callosities: Secondary | ICD-10-CM | POA: Diagnosis not present

## 2019-09-16 DIAGNOSIS — M2042 Other hammer toe(s) (acquired), left foot: Secondary | ICD-10-CM | POA: Diagnosis not present

## 2019-09-16 DIAGNOSIS — F0280 Dementia in other diseases classified elsewhere without behavioral disturbance: Secondary | ICD-10-CM | POA: Diagnosis not present

## 2019-09-16 DIAGNOSIS — E119 Type 2 diabetes mellitus without complications: Secondary | ICD-10-CM | POA: Diagnosis not present

## 2019-09-16 DIAGNOSIS — I129 Hypertensive chronic kidney disease with stage 1 through stage 4 chronic kidney disease, or unspecified chronic kidney disease: Secondary | ICD-10-CM | POA: Diagnosis not present

## 2019-09-16 DIAGNOSIS — E440 Moderate protein-calorie malnutrition: Secondary | ICD-10-CM | POA: Diagnosis not present

## 2019-10-24 ENCOUNTER — Ambulatory Visit: Payer: Medicare PPO | Admitting: Podiatry

## 2019-10-25 ENCOUNTER — Other Ambulatory Visit: Payer: Self-pay

## 2019-10-25 ENCOUNTER — Ambulatory Visit (INDEPENDENT_AMBULATORY_CARE_PROVIDER_SITE_OTHER): Payer: Medicare PPO | Admitting: Podiatry

## 2019-10-25 DIAGNOSIS — M79674 Pain in right toe(s): Secondary | ICD-10-CM | POA: Diagnosis not present

## 2019-10-25 DIAGNOSIS — M79675 Pain in left toe(s): Secondary | ICD-10-CM | POA: Diagnosis not present

## 2019-10-25 DIAGNOSIS — L84 Corns and callosities: Secondary | ICD-10-CM

## 2019-10-25 DIAGNOSIS — B351 Tinea unguium: Secondary | ICD-10-CM | POA: Diagnosis not present

## 2019-10-25 DIAGNOSIS — E119 Type 2 diabetes mellitus without complications: Secondary | ICD-10-CM

## 2019-10-30 ENCOUNTER — Encounter: Payer: Self-pay | Admitting: Podiatry

## 2019-10-30 NOTE — Progress Notes (Signed)
Subjective: Adrienne Gonzalez presents today for follow up of preventative diabetic foot care and callus(es) b/l and painful mycotic toenails b/l that are difficult to trim. Pain interferes with ambulation. Aggravating factors include wearing enclosed shoe gear. Pain is relieved with periodic professional debridement.She voices no new pedal problems on today's visit.   Patient states she has been staying with her son and daughter in law when daughter was under medical care. She did attempt to cut her toenails. Her daughter is present during today's visit. Daughter states she Adrienne be having another medical procedure done soon.   Allergies  Allergen Reactions  . Benzocaine Other (See Comments)    confusion  . Metformin And Related Diarrhea  . Morphine And Related Itching  . Olmesartan Medoxomil-Hctz Diarrhea     Objective: There were no vitals filed for this visit. No change in physical examination on today's visit. Vascular Examination:  Capillary refill time to digits immediate b/l, palpable DP pulses b/l, palpable PT pulses b/l, pedal hair absent b/l and skin temperature gradient within normal limits b/l  Dermatological Examination: Pedal skin with normal turgor, texture and tone bilaterally, no open wounds bilaterally, no interdigital macerations bilaterally, toenails 1-5 b/l elongated, dystrophic, thickened, crumbly with subungual debris and hyperkeratotic lesion(s) submet head 5 b/l and left heel.  No erythema, no edema, no drainage, no flocculence  Musculoskeletal: Normal muscle strength 5/5 to all lower extremity muscle groups bilaterally, no pain crepitus or joint limitation noted with ROM b/l and hammertoes noted to the  2-5 bilaterally  Neurological: Protective sensation intact 5/5 intact bilaterally with 10g monofilament b/l and vibratory sensation intact b/l  Assessment: 1. Pain due to onychomycosis of toenails of both feet   2. Callus   3. Controlled type 2 diabetes mellitus  without complication, without long-term current use of insulin (HCC)    Plan: -Examined patient. -No new findings. No new orders. -Continue diabetic foot care principles. -Toenails 1-5 b/l were debrided in length and girth with sterile nail nippers and dremel without iatrogenic bleeding.  -Callus(es) submet head 5 left foot, submet head 5 right foot and left heel pared utilizing sterile scalpel blade without complication or incident. Total number debrided =3. -Patient to report any pedal injuries to medical professional immediately. -Patient to continue soft, supportive shoe gear daily. -Patient/POA to call should there be question/concern in the interim.  Return in about 3 months (around 01/25/2020).

## 2019-11-09 DIAGNOSIS — N183 Chronic kidney disease, stage 3 unspecified: Secondary | ICD-10-CM | POA: Diagnosis not present

## 2019-11-15 DIAGNOSIS — N2581 Secondary hyperparathyroidism of renal origin: Secondary | ICD-10-CM | POA: Diagnosis not present

## 2019-11-15 DIAGNOSIS — N1832 Chronic kidney disease, stage 3b: Secondary | ICD-10-CM | POA: Diagnosis not present

## 2019-11-15 DIAGNOSIS — D631 Anemia in chronic kidney disease: Secondary | ICD-10-CM | POA: Diagnosis not present

## 2019-11-15 DIAGNOSIS — I129 Hypertensive chronic kidney disease with stage 1 through stage 4 chronic kidney disease, or unspecified chronic kidney disease: Secondary | ICD-10-CM | POA: Diagnosis not present

## 2019-12-19 DIAGNOSIS — Z23 Encounter for immunization: Secondary | ICD-10-CM | POA: Diagnosis not present

## 2019-12-19 DIAGNOSIS — E785 Hyperlipidemia, unspecified: Secondary | ICD-10-CM | POA: Diagnosis not present

## 2019-12-19 DIAGNOSIS — N183 Chronic kidney disease, stage 3 unspecified: Secondary | ICD-10-CM | POA: Diagnosis not present

## 2019-12-19 DIAGNOSIS — E1121 Type 2 diabetes mellitus with diabetic nephropathy: Secondary | ICD-10-CM | POA: Diagnosis not present

## 2019-12-19 DIAGNOSIS — I129 Hypertensive chronic kidney disease with stage 1 through stage 4 chronic kidney disease, or unspecified chronic kidney disease: Secondary | ICD-10-CM | POA: Diagnosis not present

## 2019-12-19 DIAGNOSIS — E1139 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: Secondary | ICD-10-CM | POA: Diagnosis not present

## 2020-01-31 ENCOUNTER — Encounter: Payer: Self-pay | Admitting: Podiatry

## 2020-01-31 ENCOUNTER — Other Ambulatory Visit: Payer: Self-pay

## 2020-01-31 ENCOUNTER — Ambulatory Visit (INDEPENDENT_AMBULATORY_CARE_PROVIDER_SITE_OTHER): Payer: Medicare PPO | Admitting: Podiatry

## 2020-01-31 DIAGNOSIS — L84 Corns and callosities: Secondary | ICD-10-CM

## 2020-01-31 DIAGNOSIS — M79674 Pain in right toe(s): Secondary | ICD-10-CM

## 2020-01-31 DIAGNOSIS — B351 Tinea unguium: Secondary | ICD-10-CM

## 2020-01-31 DIAGNOSIS — M2041 Other hammer toe(s) (acquired), right foot: Secondary | ICD-10-CM | POA: Diagnosis not present

## 2020-01-31 DIAGNOSIS — M2042 Other hammer toe(s) (acquired), left foot: Secondary | ICD-10-CM | POA: Diagnosis not present

## 2020-01-31 DIAGNOSIS — M79675 Pain in left toe(s): Secondary | ICD-10-CM | POA: Diagnosis not present

## 2020-01-31 DIAGNOSIS — E119 Type 2 diabetes mellitus without complications: Secondary | ICD-10-CM | POA: Diagnosis not present

## 2020-02-04 NOTE — Progress Notes (Signed)
ANNUAL DIABETIC FOOT EXAM  Subjective: Adrienne Gonzalez presents today for for annual diabetic foot examination and painful thick toenails that are difficult to trim. Pain interferes with ambulation. Aggravating factors include wearing enclosed shoe gear. Pain is relieved with periodic professional debridement..  Patient relates 30 year h/o diabetes.  Patient denies h/o foot wounds.  Patient denies symptoms of foot numbness.  Patient denies symptoms of foot tingling.  Patient denies symptoms of burning in feet.  Patient's blood sugar was 131 mg/dl this morning.   Harlan Stains, MD is patient's PCP. Last visit was 01/10/2020.  Past Medical History:  Diagnosis Date  . Chronic renal insufficiency, stage 1   . Diabetes mellitus without complication ([place])   . Hypertension    Patient Active Problem List   Diagnosis Date Noted  . Right epiretinal membrane 07/27/2019  . Primary open angle glaucoma of both eyes, severe stage 07/27/2019  . Left epiretinal membrane 07/27/2019  . Moderate nonproliferative diabetic retinopathy of both eyes ([place]) 07/27/2019  . N&V (nausea and vomiting)   . FTT (failure to thrive) in adult   . Hypokalemia   . CKD (chronic kidney disease), stage III ([place])   . Chronic renal insufficiency, stage 1 01/10/2018  . Metabolic acidosis 29/56/2130  . Hypoglycemia 01/08/2018  . Diarrhea 01/08/2018  . AKI (acute kidney injury) ([place]) 01/08/2018  . Diabetes mellitus without complication ([place])   . Hypertension    Past Surgical History:  Procedure Laterality Date  . BIOPSY  01/19/2018   Procedure: BIOPSY;  Surgeon: Otis Brace, MD;  Location: [facility];  Service: Gastroenterology;;  . Otho Darner SIGMOIDOSCOPY N/A 01/19/2018   Procedure: FLEXIBLE SIGMOIDOSCOPY;  Surgeon: Otis Brace, MD;  Location: [facility];  Service: Gastroenterology;  Laterality: N/A;   Current Outpatient Medications on File Prior to Visit  Medication Sig Dispense Refill  . ACCU-CHEK  GUIDE test strip     . Accu-Chek Softclix Lancets lancets     . amLODipine (NORVASC) 5 MG tablet Take 1 tablet (5 mg total) by mouth daily. 30 tablet 0  . aspirin EC 81 MG tablet Take 81 mg by mouth daily.    . B-D ULTRAFINE III SHORT PEN 31G X 8 MM MISC     . bimatoprost (LUMIGAN) 0.01 % SOLN Place 1 drop into both eyes at bedtime. (Patient not taking: Reported on 07/27/2019)    . Blood Glucose Monitoring Suppl (ACCU-CHEK GUIDE) w/Device KIT     . brimonidine (ALPHAGAN P) 0.1 % SOLN Place 1 drop into both eyes 3 (three) times daily.    . cholecalciferol (VITAMIN D3) 25 MCG (1000 UT) tablet Take 1,000 Units by mouth daily.    . colestipol (COLESTID) 1 g tablet Take 1 tablet (1 g total) by mouth 2 (two) times daily. 30 tablet 0  . dorzolamide-timolol (COSOPT) 22.3-6.8 MG/ML ophthalmic solution Place 1 drop into both eyes 2 (two) times daily.    Marland Kitchen glipiZIDE (GLUCOTROL) 5 MG tablet TAKE 1 TABLET BY MOUTH ONCE A DAY BEFORE BREAKFAST IF SUGAR IS ABOVE 200    . HUMALOG MIX 75/25 KWIKPEN (75-25) 100 UNIT/ML Kwikpen SMARTSIG:16 Unit(s) SUB-Q Every Morning    . metoCLOPramide (REGLAN) 5 MG tablet Take 1 tablet (5 mg total) by mouth 4 (four) times daily -  before meals and at bedtime. 90 tablet 0  . NOVOLOG MIX 70/30 FLEXPEN (70-30) 100 UNIT/ML FlexPen     . pravastatin (PRAVACHOL) 40 MG tablet Take 40 mg by mouth daily.    Marland Kitchen VYZULTA 0.024 %  SOLN      No current facility-administered medications on file prior to visit.    Allergies  Allergen Reactions  . Benzocaine Other (See Comments)    confusion  . Metformin And Related Diarrhea  . Morphine And Related Itching  . Olmesartan Medoxomil-Hctz Diarrhea   Social History   Occupational History  . Not on file  Tobacco Use  . Smoking status: Never Smoker  . Smokeless tobacco: Never Used  Vaping Use  . Vaping Use: Never used  Substance and Sexual Activity  . Alcohol use: Never  . Drug use: Never  . Sexual activity: Not on file   History  reviewed. No pertinent family history. Immunization History  Administered Date(s) Administered  . PFIZER(Purple Top)SARS-COV-2 Vaccination 03/11/2019, 04/13/2019     Review of Systems: Negative except as noted in the HPI.  Objective: There were no vitals filed for this visit.  Adrienne Gonzalez is a pleasant 87 y.o. female in NAD. AAO X 3.  Vascular Examination: Capillary refill time to digits immediate b/l. Palpable pedal pulses b/l LE. Pedal hair absent. Lower extremity skin temperature gradient within normal limits.  Dermatological Examination: Pedal skin with normal turgor, texture and tone bilaterally. No open wounds bilaterally. No interdigital macerations bilaterally. Toenails 1-5 b/l elongated, discolored, dystrophic, thickened, crumbly with subungual debris and tenderness to dorsal palpation. Hyperkeratotic lesion(s) left heel, submet head 5 left foot and submet head 5 right foot.  No erythema, no edema, no drainage, no fluctuance.  Musculoskeletal Examination: Normal muscle strength 5/5 to all lower extremity muscle groups bilaterally. No pain crepitus or joint limitation noted with ROM b/l. Hammertoes noted to the 2-5 bilaterally.  Footwear Assessment: Does the patient wear appropriate shoes? Yes. Does the patient need inserts/orthotics? No.  Neurological Examination: Protective sensation intact 5/5 intact bilaterally with 10g monofilament b/l. Vibratory sensation intact b/l.  Assessment: 1. Pain due to onychomycosis of toenails of both feet   2. Callus   3. Controlled type 2 diabetes mellitus without complication, without long-term current use of insulin ([place])   4. Acquired hammertoes of both feet   5. Encounter for diabetic foot exam ([place])      ADA Risk Categorization: Low Risk :  Patient has all of the following: Intact protective sensation No prior foot ulcer  No severe deformity Pedal pulses present  Plan: -Examined patient. -Diabetic foot examination performed  on today's visit. -Patient to continue soft, supportive shoe gear daily. -Toenails 1-5 b/l were debrided in length and girth with sterile nail nippers and dremel without iatrogenic bleeding.  -Callus(es) left heel, submet head 5 left foot and submet head 5 right foot pared utilizing sterile scalpel blade without complication or incident. Total number debrided =3. -Patient to report any pedal injuries to medical professional immediately. -Patient/POA to call should there be question/concern in the interim.  Return in about 3 months (around 04/30/2020).  Marzetta Board, DPM

## 2020-03-22 DIAGNOSIS — E559 Vitamin D deficiency, unspecified: Secondary | ICD-10-CM | POA: Diagnosis not present

## 2020-03-22 DIAGNOSIS — E785 Hyperlipidemia, unspecified: Secondary | ICD-10-CM | POA: Diagnosis not present

## 2020-03-22 DIAGNOSIS — E1121 Type 2 diabetes mellitus with diabetic nephropathy: Secondary | ICD-10-CM | POA: Diagnosis not present

## 2020-03-22 DIAGNOSIS — N183 Chronic kidney disease, stage 3 unspecified: Secondary | ICD-10-CM | POA: Diagnosis not present

## 2020-03-22 DIAGNOSIS — E1139 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: Secondary | ICD-10-CM | POA: Diagnosis not present

## 2020-03-22 DIAGNOSIS — I129 Hypertensive chronic kidney disease with stage 1 through stage 4 chronic kidney disease, or unspecified chronic kidney disease: Secondary | ICD-10-CM | POA: Diagnosis not present

## 2020-03-22 DIAGNOSIS — Z23 Encounter for immunization: Secondary | ICD-10-CM | POA: Diagnosis not present

## 2020-03-22 DIAGNOSIS — L299 Pruritus, unspecified: Secondary | ICD-10-CM | POA: Diagnosis not present

## 2020-04-17 DIAGNOSIS — E1139 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: Secondary | ICD-10-CM | POA: Diagnosis not present

## 2020-04-17 DIAGNOSIS — H401133 Primary open-angle glaucoma, bilateral, severe stage: Secondary | ICD-10-CM | POA: Diagnosis not present

## 2020-04-17 DIAGNOSIS — H04123 Dry eye syndrome of bilateral lacrimal glands: Secondary | ICD-10-CM | POA: Diagnosis not present

## 2020-04-17 DIAGNOSIS — H35372 Puckering of macula, left eye: Secondary | ICD-10-CM | POA: Diagnosis not present

## 2020-05-15 ENCOUNTER — Encounter: Payer: Self-pay | Admitting: Podiatry

## 2020-05-15 ENCOUNTER — Ambulatory Visit: Payer: Medicare PPO | Admitting: Podiatry

## 2020-05-15 ENCOUNTER — Other Ambulatory Visit: Payer: Self-pay

## 2020-05-15 DIAGNOSIS — E1139 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: Secondary | ICD-10-CM | POA: Insufficient documentation

## 2020-05-15 DIAGNOSIS — B351 Tinea unguium: Secondary | ICD-10-CM

## 2020-05-15 DIAGNOSIS — M79674 Pain in right toe(s): Secondary | ICD-10-CM

## 2020-05-15 DIAGNOSIS — M2042 Other hammer toe(s) (acquired), left foot: Secondary | ICD-10-CM

## 2020-05-15 DIAGNOSIS — E559 Vitamin D deficiency, unspecified: Secondary | ICD-10-CM | POA: Insufficient documentation

## 2020-05-15 DIAGNOSIS — K219 Gastro-esophageal reflux disease without esophagitis: Secondary | ICD-10-CM | POA: Insufficient documentation

## 2020-05-15 DIAGNOSIS — E785 Hyperlipidemia, unspecified: Secondary | ICD-10-CM | POA: Insufficient documentation

## 2020-05-15 DIAGNOSIS — M8588 Other specified disorders of bone density and structure, other site: Secondary | ICD-10-CM | POA: Insufficient documentation

## 2020-05-15 DIAGNOSIS — Z794 Long term (current) use of insulin: Secondary | ICD-10-CM | POA: Insufficient documentation

## 2020-05-15 DIAGNOSIS — E119 Type 2 diabetes mellitus without complications: Secondary | ICD-10-CM

## 2020-05-15 DIAGNOSIS — M79675 Pain in left toe(s): Secondary | ICD-10-CM

## 2020-05-15 DIAGNOSIS — E1121 Type 2 diabetes mellitus with diabetic nephropathy: Secondary | ICD-10-CM | POA: Insufficient documentation

## 2020-05-15 DIAGNOSIS — K3184 Gastroparesis: Secondary | ICD-10-CM | POA: Insufficient documentation

## 2020-05-15 DIAGNOSIS — M2041 Other hammer toe(s) (acquired), right foot: Secondary | ICD-10-CM

## 2020-05-15 DIAGNOSIS — N2581 Secondary hyperparathyroidism of renal origin: Secondary | ICD-10-CM | POA: Insufficient documentation

## 2020-05-15 DIAGNOSIS — E440 Moderate protein-calorie malnutrition: Secondary | ICD-10-CM | POA: Insufficient documentation

## 2020-05-20 NOTE — Progress Notes (Signed)
  Subjective:  Patient ID: Adrienne Gonzalez, female    DOB: 09/13/1925,  MRN: [id number]  87 y.o. female presents preventative diabetic foot care and callus(es) b/l feet and painful thick toenails that are difficult to trim. Painful toenails interfere with ambulation. Aggravating factors include wearing enclosed shoe gear. Pain is relieved with periodic professional debridement. Painful calluses are aggravated when weightbearing with and without shoegear. Pain is relieved with periodic professional debridement.   Her daughter is present during today's visit. They voice no new pedal concerns on today's visit.  PCP is Dr. Laurann Montana and last visit was 03/22/2020.  Allergies  Allergen Reactions  . Benzocaine Other (See Comments)    confusion  . Metformin And Related Diarrhea  . Morphine And Related Itching  . Olmesartan     Other reaction(s): diarrhea  . Olmesartan Medoxomil-Hctz Diarrhea    Review of Systems: Negative except as noted in the HPI.   Objective:   Constitutional Pt is a pleasant 87 y.o. African American female WD, WN in NAD. AAO x 3.   Vascular Capillary refill time to digits immediate b/l. Palpable pedal pulses b/l LE. Pedal hair absent. Lower extremity skin temperature gradient within normal limits.  Neurologic Protective sensation intact 5/5 intact bilaterally with 10g monofilament b/l. Vibratory sensation intact b/l.  Dermatologic Pedal skin with normal turgor, texture and tone bilaterally. No open wounds bilaterally. No interdigital macerations bilaterally. Toenails 1-5 b/l elongated, discolored, dystrophic, thickened, crumbly with subungual debris and tenderness to dorsal palpation. No hyperkeratotic nor porokeratotic lesions present on today's visit.  Orthopedic: Normal muscle strength 5/5 to all lower extremity muscle groups bilaterally. No pain crepitus or joint limitation noted with ROM b/l. Hammertoes noted to the 2-5 bilaterally.   Radiographs: None Assessment:    1. Pain due to onychomycosis of toenails of both feet   2. Acquired hammertoes of both feet   3. Controlled type 2 diabetes mellitus without complication, without long-term current use of insulin (HCC)     Plan:  Patient was evaluated and treated and all questions answered.  Onychomycosis with pain -Nails palliatively debridement as below. -Educated on self-care  Procedure: Nail Debridement Rationale: Pain Type of Debridement: manual, sharp debridement. Instrumentation: Nail nipper, rotary burr. Number of Nails: 10  -Examined patient. -Continue diabetic foot care principles. -Patient to continue soft, supportive shoe gear daily. -Toenails 1-5 b/l were debrided in length and girth with sterile nail nippers and dremel without iatrogenic bleeding.  -Patient to report any pedal injuries to medical professional immediately. -Patient/POA to call should there be question/concern in the interim.  Return in about 3 months (around 08/15/2020).  [name], DPM

## 2020-07-26 DIAGNOSIS — N183 Chronic kidney disease, stage 3 unspecified: Secondary | ICD-10-CM | POA: Diagnosis not present

## 2020-07-26 DIAGNOSIS — I129 Hypertensive chronic kidney disease with stage 1 through stage 4 chronic kidney disease, or unspecified chronic kidney disease: Secondary | ICD-10-CM | POA: Diagnosis not present

## 2020-07-26 DIAGNOSIS — H543 Unqualified visual loss, both eyes: Secondary | ICD-10-CM | POA: Diagnosis not present

## 2020-07-26 DIAGNOSIS — H401133 Primary open-angle glaucoma, bilateral, severe stage: Secondary | ICD-10-CM | POA: Diagnosis not present

## 2020-07-26 DIAGNOSIS — E1139 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: Secondary | ICD-10-CM | POA: Diagnosis not present

## 2020-07-26 DIAGNOSIS — E785 Hyperlipidemia, unspecified: Secondary | ICD-10-CM | POA: Diagnosis not present

## 2020-07-26 DIAGNOSIS — E1121 Type 2 diabetes mellitus with diabetic nephropathy: Secondary | ICD-10-CM | POA: Diagnosis not present

## 2020-07-26 DIAGNOSIS — K3184 Gastroparesis: Secondary | ICD-10-CM | POA: Diagnosis not present

## 2020-07-26 DIAGNOSIS — E559 Vitamin D deficiency, unspecified: Secondary | ICD-10-CM | POA: Diagnosis not present

## 2020-07-31 DIAGNOSIS — E1121 Type 2 diabetes mellitus with diabetic nephropathy: Secondary | ICD-10-CM | POA: Diagnosis not present

## 2020-07-31 DIAGNOSIS — E559 Vitamin D deficiency, unspecified: Secondary | ICD-10-CM | POA: Diagnosis not present

## 2020-07-31 DIAGNOSIS — E785 Hyperlipidemia, unspecified: Secondary | ICD-10-CM | POA: Diagnosis not present

## 2020-07-31 DIAGNOSIS — Z794 Long term (current) use of insulin: Secondary | ICD-10-CM | POA: Diagnosis not present

## 2020-08-07 DIAGNOSIS — N1832 Chronic kidney disease, stage 3b: Secondary | ICD-10-CM | POA: Diagnosis not present

## 2020-08-13 DIAGNOSIS — N1832 Chronic kidney disease, stage 3b: Secondary | ICD-10-CM | POA: Diagnosis not present

## 2020-08-13 DIAGNOSIS — I129 Hypertensive chronic kidney disease with stage 1 through stage 4 chronic kidney disease, or unspecified chronic kidney disease: Secondary | ICD-10-CM | POA: Diagnosis not present

## 2020-08-13 DIAGNOSIS — N2581 Secondary hyperparathyroidism of renal origin: Secondary | ICD-10-CM | POA: Diagnosis not present

## 2020-08-13 DIAGNOSIS — D631 Anemia in chronic kidney disease: Secondary | ICD-10-CM | POA: Diagnosis not present

## 2020-08-15 ENCOUNTER — Encounter: Payer: Self-pay | Admitting: Podiatry

## 2020-08-15 ENCOUNTER — Ambulatory Visit: Payer: Medicare PPO | Admitting: Podiatry

## 2020-08-15 ENCOUNTER — Other Ambulatory Visit: Payer: Self-pay

## 2020-08-15 DIAGNOSIS — M79674 Pain in right toe(s): Secondary | ICD-10-CM | POA: Diagnosis not present

## 2020-08-15 DIAGNOSIS — M79675 Pain in left toe(s): Secondary | ICD-10-CM | POA: Diagnosis not present

## 2020-08-15 DIAGNOSIS — E119 Type 2 diabetes mellitus without complications: Secondary | ICD-10-CM | POA: Diagnosis not present

## 2020-08-15 DIAGNOSIS — L84 Corns and callosities: Secondary | ICD-10-CM | POA: Diagnosis not present

## 2020-08-15 DIAGNOSIS — B351 Tinea unguium: Secondary | ICD-10-CM | POA: Diagnosis not present

## 2020-08-19 NOTE — Progress Notes (Signed)
  Subjective:  Patient ID: Adrienne Gonzalez, female    DOB: [birth year],  MRN: [id number]  Adrienne Gonzalez presents to clinic today for preventative diabetic foot care and painful thick toenails that are difficult to trim. Pain interferes with ambulation. Aggravating factors include wearing enclosed shoe gear. Pain is relieved with periodic professional debridement.  She relates she has some rough skin on the left heel.  Patient states blood glucose was 139 mg/dl on today.  Her daughter is present during today's visit.  PCP is Adrienne Montana, MD , and last visit was two weeks ago..  Allergies  Allergen Reactions   Benzocaine Other (See Comments)    confusion   Metformin And Related Diarrhea   Morphine And Related Itching   Olmesartan     Other reaction(s): diarrhea   Olmesartan Medoxomil-Hctz Diarrhea    Review of Systems: Negative except as noted in the HPI. Objective:   Constitutional Adrienne Gonzalez is a pleasant 87 y.o. African American female, WD, WN in NAD. AAO x 3.   Vascular Capillary refill time to digits immediate b/l. Palpable pedal pulses b/l LE. Pedal hair absent. Lower extremity skin temperature gradient within normal limits. No pain with calf compression b/l. No cyanosis or clubbing noted.  Neurologic Normal speech. Oriented to person, place, and time. Protective sensation intact 5/5 intact bilaterally with 10g monofilament b/l. Vibratory sensation intact b/l.  Dermatologic Pedal skin with normal turgor, texture and tone b/l lower extremities. No open wounds b/l lower extremities. No interdigital macerations b/l lower extremities. Toenails 1-5 b/l elongated, discolored, dystrophic, thickened, crumbly with subungual debris and tenderness to dorsal palpation. Hyperkeratotic lesion(s) left heel.  No erythema, no edema, no drainage, no fluctuance.  Orthopedic: Normal muscle strength 5/5 to all lower extremity muscle groups bilaterally. No pain crepitus or joint limitation noted with ROM b/l  lower extremities. Hammertoe(s) noted to the 2-5 bilaterally.   Radiographs: None Assessment:   1. Pain due to onychomycosis of toenails of both feet   2. Callus   3. Controlled type 2 diabetes mellitus without complication, without long-term current use of insulin (HCC)    Plan:  -Examined patient. -Continue diabetic foot care principles: inspect feet daily, monitor glucose as recommended by PCP and/or Endocrinologist, and follow prescribed diet per PCP, Endocrinologist and/or dietician. -Patient to continue soft, supportive shoe gear daily. -Toenails 1-5 b/l were debrided in length and girth with sterile nail nippers and dremel without iatrogenic bleeding.  -Callus(es) left heel pared utilizing sterile scalpel blade without complication or incident. Total number debrided =1. -Patient to report any pedal injuries to medical professional immediately. -Patient/POA to call should there be question/concern in the interim.  Return in about 3 months (around 11/15/2020).  [name], DPM

## 2020-08-21 DIAGNOSIS — E113392 Type 2 diabetes mellitus with moderate nonproliferative diabetic retinopathy without macular edema, left eye: Secondary | ICD-10-CM | POA: Diagnosis not present

## 2020-08-21 DIAGNOSIS — H47233 Glaucomatous optic atrophy, bilateral: Secondary | ICD-10-CM | POA: Diagnosis not present

## 2020-08-21 DIAGNOSIS — H35372 Puckering of macula, left eye: Secondary | ICD-10-CM | POA: Diagnosis not present

## 2020-08-21 DIAGNOSIS — E1139 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: Secondary | ICD-10-CM | POA: Diagnosis not present

## 2020-08-21 DIAGNOSIS — H401133 Primary open-angle glaucoma, bilateral, severe stage: Secondary | ICD-10-CM | POA: Diagnosis not present

## 2020-08-21 DIAGNOSIS — H04123 Dry eye syndrome of bilateral lacrimal glands: Secondary | ICD-10-CM | POA: Diagnosis not present

## 2020-10-25 DIAGNOSIS — Z23 Encounter for immunization: Secondary | ICD-10-CM | POA: Diagnosis not present

## 2020-10-25 DIAGNOSIS — Z794 Long term (current) use of insulin: Secondary | ICD-10-CM | POA: Diagnosis not present

## 2020-10-25 DIAGNOSIS — E1121 Type 2 diabetes mellitus with diabetic nephropathy: Secondary | ICD-10-CM | POA: Diagnosis not present

## 2020-10-25 DIAGNOSIS — I129 Hypertensive chronic kidney disease with stage 1 through stage 4 chronic kidney disease, or unspecified chronic kidney disease: Secondary | ICD-10-CM | POA: Diagnosis not present

## 2020-10-25 DIAGNOSIS — N183 Chronic kidney disease, stage 3 unspecified: Secondary | ICD-10-CM | POA: Diagnosis not present

## 2020-10-25 DIAGNOSIS — H548 Legal blindness, as defined in USA: Secondary | ICD-10-CM | POA: Diagnosis not present

## 2020-10-25 DIAGNOSIS — E559 Vitamin D deficiency, unspecified: Secondary | ICD-10-CM | POA: Diagnosis not present

## 2020-10-25 DIAGNOSIS — E1139 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: Secondary | ICD-10-CM | POA: Diagnosis not present

## 2020-10-25 DIAGNOSIS — E785 Hyperlipidemia, unspecified: Secondary | ICD-10-CM | POA: Diagnosis not present

## 2020-10-27 DIAGNOSIS — K219 Gastro-esophageal reflux disease without esophagitis: Secondary | ICD-10-CM | POA: Diagnosis not present

## 2020-10-27 DIAGNOSIS — I129 Hypertensive chronic kidney disease with stage 1 through stage 4 chronic kidney disease, or unspecified chronic kidney disease: Secondary | ICD-10-CM | POA: Diagnosis not present

## 2020-10-27 DIAGNOSIS — E113393 Type 2 diabetes mellitus with moderate nonproliferative diabetic retinopathy without macular edema, bilateral: Secondary | ICD-10-CM | POA: Diagnosis not present

## 2020-10-27 DIAGNOSIS — N183 Chronic kidney disease, stage 3 unspecified: Secondary | ICD-10-CM | POA: Diagnosis not present

## 2020-10-27 DIAGNOSIS — E1139 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: Secondary | ICD-10-CM | POA: Diagnosis not present

## 2020-10-27 DIAGNOSIS — E440 Moderate protein-calorie malnutrition: Secondary | ICD-10-CM | POA: Diagnosis not present

## 2020-10-27 DIAGNOSIS — H548 Legal blindness, as defined in USA: Secondary | ICD-10-CM | POA: Diagnosis not present

## 2020-10-27 DIAGNOSIS — E1122 Type 2 diabetes mellitus with diabetic chronic kidney disease: Secondary | ICD-10-CM | POA: Diagnosis not present

## 2020-10-27 DIAGNOSIS — H401133 Primary open-angle glaucoma, bilateral, severe stage: Secondary | ICD-10-CM | POA: Diagnosis not present

## 2020-11-01 DIAGNOSIS — H548 Legal blindness, as defined in USA: Secondary | ICD-10-CM | POA: Diagnosis not present

## 2020-11-01 DIAGNOSIS — E1122 Type 2 diabetes mellitus with diabetic chronic kidney disease: Secondary | ICD-10-CM | POA: Diagnosis not present

## 2020-11-01 DIAGNOSIS — N183 Chronic kidney disease, stage 3 unspecified: Secondary | ICD-10-CM | POA: Diagnosis not present

## 2020-11-01 DIAGNOSIS — K219 Gastro-esophageal reflux disease without esophagitis: Secondary | ICD-10-CM | POA: Diagnosis not present

## 2020-11-01 DIAGNOSIS — E1139 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: Secondary | ICD-10-CM | POA: Diagnosis not present

## 2020-11-01 DIAGNOSIS — E113393 Type 2 diabetes mellitus with moderate nonproliferative diabetic retinopathy without macular edema, bilateral: Secondary | ICD-10-CM | POA: Diagnosis not present

## 2020-11-01 DIAGNOSIS — E440 Moderate protein-calorie malnutrition: Secondary | ICD-10-CM | POA: Diagnosis not present

## 2020-11-01 DIAGNOSIS — H401133 Primary open-angle glaucoma, bilateral, severe stage: Secondary | ICD-10-CM | POA: Diagnosis not present

## 2020-11-01 DIAGNOSIS — I129 Hypertensive chronic kidney disease with stage 1 through stage 4 chronic kidney disease, or unspecified chronic kidney disease: Secondary | ICD-10-CM | POA: Diagnosis not present

## 2020-11-20 ENCOUNTER — Ambulatory Visit: Payer: Medicare PPO | Admitting: Podiatry

## 2020-11-20 ENCOUNTER — Other Ambulatory Visit: Payer: Self-pay

## 2020-11-20 ENCOUNTER — Encounter: Payer: Self-pay | Admitting: Podiatry

## 2020-11-20 DIAGNOSIS — B351 Tinea unguium: Secondary | ICD-10-CM

## 2020-11-20 DIAGNOSIS — E119 Type 2 diabetes mellitus without complications: Secondary | ICD-10-CM | POA: Diagnosis not present

## 2020-11-20 DIAGNOSIS — M79675 Pain in left toe(s): Secondary | ICD-10-CM

## 2020-11-20 DIAGNOSIS — L84 Corns and callosities: Secondary | ICD-10-CM

## 2020-11-20 DIAGNOSIS — H548 Legal blindness, as defined in USA: Secondary | ICD-10-CM | POA: Insufficient documentation

## 2020-11-20 DIAGNOSIS — H543 Unqualified visual loss, both eyes: Secondary | ICD-10-CM | POA: Insufficient documentation

## 2020-11-20 DIAGNOSIS — M79674 Pain in right toe(s): Secondary | ICD-10-CM | POA: Diagnosis not present

## 2020-11-23 DIAGNOSIS — H548 Legal blindness, as defined in USA: Secondary | ICD-10-CM | POA: Diagnosis not present

## 2020-11-23 DIAGNOSIS — K219 Gastro-esophageal reflux disease without esophagitis: Secondary | ICD-10-CM | POA: Diagnosis not present

## 2020-11-23 DIAGNOSIS — I129 Hypertensive chronic kidney disease with stage 1 through stage 4 chronic kidney disease, or unspecified chronic kidney disease: Secondary | ICD-10-CM | POA: Diagnosis not present

## 2020-11-23 DIAGNOSIS — E113393 Type 2 diabetes mellitus with moderate nonproliferative diabetic retinopathy without macular edema, bilateral: Secondary | ICD-10-CM | POA: Diagnosis not present

## 2020-11-23 DIAGNOSIS — E1122 Type 2 diabetes mellitus with diabetic chronic kidney disease: Secondary | ICD-10-CM | POA: Diagnosis not present

## 2020-11-23 DIAGNOSIS — N183 Chronic kidney disease, stage 3 unspecified: Secondary | ICD-10-CM | POA: Diagnosis not present

## 2020-11-23 DIAGNOSIS — E1139 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: Secondary | ICD-10-CM | POA: Diagnosis not present

## 2020-11-23 DIAGNOSIS — E440 Moderate protein-calorie malnutrition: Secondary | ICD-10-CM | POA: Diagnosis not present

## 2020-11-23 DIAGNOSIS — H401133 Primary open-angle glaucoma, bilateral, severe stage: Secondary | ICD-10-CM | POA: Diagnosis not present

## 2020-11-24 NOTE — Progress Notes (Signed)
Subjective: Adrienne Gonzalez is a 87 y.o. female patient seen today for diabetic foot care follow up/exam. She c/o painful thick toenails that are difficult to trim. Pain interferes with ambulation. Aggravating factors include wearing enclosed shoe gear. Pain is relieved with periodic professional debridement.  New problems reported today: None.  Patient states their blood glucose was 137 mg/dl this morning.   PCP is Laurann Montana, MD. Last visit was: 11/01/2020.  Allergies  Allergen Reactions   Benzocaine Other (See Comments)    confusion   Metformin And Related Diarrhea   Morphine And Related Itching   Olmesartan     Other reaction(s): diarrhea   Olmesartan Medoxomil-Hctz Diarrhea    Objective: Physical Exam  General: Patient is a pleasant 87 y.o. African American female WD, WN in NAD. AAO x 3.   Neurovascular Examination: CFT immediate b/l LE. Palpable DP/PT pulses b/l LE. Digital hair absent b/l. Skin temperature gradient WNL b/l. No pain with calf compression b/l. No edema noted b/l. No cyanosis or clubbing noted b/l LE.  Protective sensation intact 5/5 intact bilaterally with 10g monofilament b/l. Proprioception intact bilaterally.  Dermatological:  Pedal integument with normal turgor, texture and tone b/l LE. No open wounds b/l. No interdigital macerations b/l. Toenails 1-5 b/l elongated, thickened, discolored with subungual debris. +Tenderness with dorsal palpation of nailplates. Hyperkeratotic lesion(s) noted bilateral heels.  Musculoskeletal:  Normal muscle strength 5/5 to all lower extremity muscle groups bilaterally. Hammertoe deformity noted 2-5 b/l.  Assessment: 1. Pain due to onychomycosis of toenails of both feet   2. Callus   3. Controlled type 2 diabetes mellitus without complication, without long-term current use of insulin (HCC)    Plan: Patient was evaluated and treated and all questions answered. Consent given for treatment as described below: -No new  findings. No new orders. -Patient to continue soft, supportive shoe gear daily. -Mycotic toenails 1-5 bilaterally were debrided in length and girth with sterile nail nippers and dremel without incident. -Callus(es) bilateral heels pared utilizing sterile scalpel blade without complication or incident. Total number debrided =2. -Patient/POA to call should there be question/concern in the interim.  Return in about 3 months (around 02/20/2021).  [name], DPM

## 2020-11-26 DIAGNOSIS — E1139 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: Secondary | ICD-10-CM | POA: Diagnosis not present

## 2020-11-26 DIAGNOSIS — I129 Hypertensive chronic kidney disease with stage 1 through stage 4 chronic kidney disease, or unspecified chronic kidney disease: Secondary | ICD-10-CM | POA: Diagnosis not present

## 2020-11-26 DIAGNOSIS — N183 Chronic kidney disease, stage 3 unspecified: Secondary | ICD-10-CM | POA: Diagnosis not present

## 2020-11-26 DIAGNOSIS — H401133 Primary open-angle glaucoma, bilateral, severe stage: Secondary | ICD-10-CM | POA: Diagnosis not present

## 2020-11-26 DIAGNOSIS — H548 Legal blindness, as defined in USA: Secondary | ICD-10-CM | POA: Diagnosis not present

## 2020-11-26 DIAGNOSIS — E1122 Type 2 diabetes mellitus with diabetic chronic kidney disease: Secondary | ICD-10-CM | POA: Diagnosis not present

## 2020-11-26 DIAGNOSIS — K219 Gastro-esophageal reflux disease without esophagitis: Secondary | ICD-10-CM | POA: Diagnosis not present

## 2020-11-26 DIAGNOSIS — E113393 Type 2 diabetes mellitus with moderate nonproliferative diabetic retinopathy without macular edema, bilateral: Secondary | ICD-10-CM | POA: Diagnosis not present

## 2020-11-26 DIAGNOSIS — E440 Moderate protein-calorie malnutrition: Secondary | ICD-10-CM | POA: Diagnosis not present

## 2020-11-30 DIAGNOSIS — E1122 Type 2 diabetes mellitus with diabetic chronic kidney disease: Secondary | ICD-10-CM | POA: Diagnosis not present

## 2020-11-30 DIAGNOSIS — E440 Moderate protein-calorie malnutrition: Secondary | ICD-10-CM | POA: Diagnosis not present

## 2020-11-30 DIAGNOSIS — E1139 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: Secondary | ICD-10-CM | POA: Diagnosis not present

## 2020-11-30 DIAGNOSIS — E113393 Type 2 diabetes mellitus with moderate nonproliferative diabetic retinopathy without macular edema, bilateral: Secondary | ICD-10-CM | POA: Diagnosis not present

## 2020-11-30 DIAGNOSIS — K219 Gastro-esophageal reflux disease without esophagitis: Secondary | ICD-10-CM | POA: Diagnosis not present

## 2020-11-30 DIAGNOSIS — I129 Hypertensive chronic kidney disease with stage 1 through stage 4 chronic kidney disease, or unspecified chronic kidney disease: Secondary | ICD-10-CM | POA: Diagnosis not present

## 2020-11-30 DIAGNOSIS — H401133 Primary open-angle glaucoma, bilateral, severe stage: Secondary | ICD-10-CM | POA: Diagnosis not present

## 2020-11-30 DIAGNOSIS — H548 Legal blindness, as defined in USA: Secondary | ICD-10-CM | POA: Diagnosis not present

## 2020-11-30 DIAGNOSIS — N183 Chronic kidney disease, stage 3 unspecified: Secondary | ICD-10-CM | POA: Diagnosis not present

## 2020-12-04 DIAGNOSIS — N183 Chronic kidney disease, stage 3 unspecified: Secondary | ICD-10-CM | POA: Diagnosis not present

## 2020-12-04 DIAGNOSIS — H548 Legal blindness, as defined in USA: Secondary | ICD-10-CM | POA: Diagnosis not present

## 2020-12-04 DIAGNOSIS — E440 Moderate protein-calorie malnutrition: Secondary | ICD-10-CM | POA: Diagnosis not present

## 2020-12-04 DIAGNOSIS — I129 Hypertensive chronic kidney disease with stage 1 through stage 4 chronic kidney disease, or unspecified chronic kidney disease: Secondary | ICD-10-CM | POA: Diagnosis not present

## 2020-12-04 DIAGNOSIS — E113393 Type 2 diabetes mellitus with moderate nonproliferative diabetic retinopathy without macular edema, bilateral: Secondary | ICD-10-CM | POA: Diagnosis not present

## 2020-12-04 DIAGNOSIS — H401133 Primary open-angle glaucoma, bilateral, severe stage: Secondary | ICD-10-CM | POA: Diagnosis not present

## 2020-12-04 DIAGNOSIS — E1122 Type 2 diabetes mellitus with diabetic chronic kidney disease: Secondary | ICD-10-CM | POA: Diagnosis not present

## 2020-12-04 DIAGNOSIS — E1139 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: Secondary | ICD-10-CM | POA: Diagnosis not present

## 2020-12-04 DIAGNOSIS — K219 Gastro-esophageal reflux disease without esophagitis: Secondary | ICD-10-CM | POA: Diagnosis not present

## 2020-12-13 DIAGNOSIS — E440 Moderate protein-calorie malnutrition: Secondary | ICD-10-CM | POA: Diagnosis not present

## 2020-12-13 DIAGNOSIS — E1122 Type 2 diabetes mellitus with diabetic chronic kidney disease: Secondary | ICD-10-CM | POA: Diagnosis not present

## 2020-12-13 DIAGNOSIS — N183 Chronic kidney disease, stage 3 unspecified: Secondary | ICD-10-CM | POA: Diagnosis not present

## 2020-12-13 DIAGNOSIS — H401133 Primary open-angle glaucoma, bilateral, severe stage: Secondary | ICD-10-CM | POA: Diagnosis not present

## 2020-12-13 DIAGNOSIS — I129 Hypertensive chronic kidney disease with stage 1 through stage 4 chronic kidney disease, or unspecified chronic kidney disease: Secondary | ICD-10-CM | POA: Diagnosis not present

## 2020-12-13 DIAGNOSIS — K219 Gastro-esophageal reflux disease without esophagitis: Secondary | ICD-10-CM | POA: Diagnosis not present

## 2020-12-13 DIAGNOSIS — E1139 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: Secondary | ICD-10-CM | POA: Diagnosis not present

## 2020-12-13 DIAGNOSIS — E113393 Type 2 diabetes mellitus with moderate nonproliferative diabetic retinopathy without macular edema, bilateral: Secondary | ICD-10-CM | POA: Diagnosis not present

## 2020-12-13 DIAGNOSIS — H548 Legal blindness, as defined in USA: Secondary | ICD-10-CM | POA: Diagnosis not present

## 2020-12-15 IMAGING — DX DG ABDOMEN 1V
1 series · 1 of 1 positions shown · non-contrast
Comparison: 01/08/2018

CLINICAL DATA: Weight loss and failure to thrive

EXAM:
ABDOMEN - 1 VIEW

[abdomen kub]
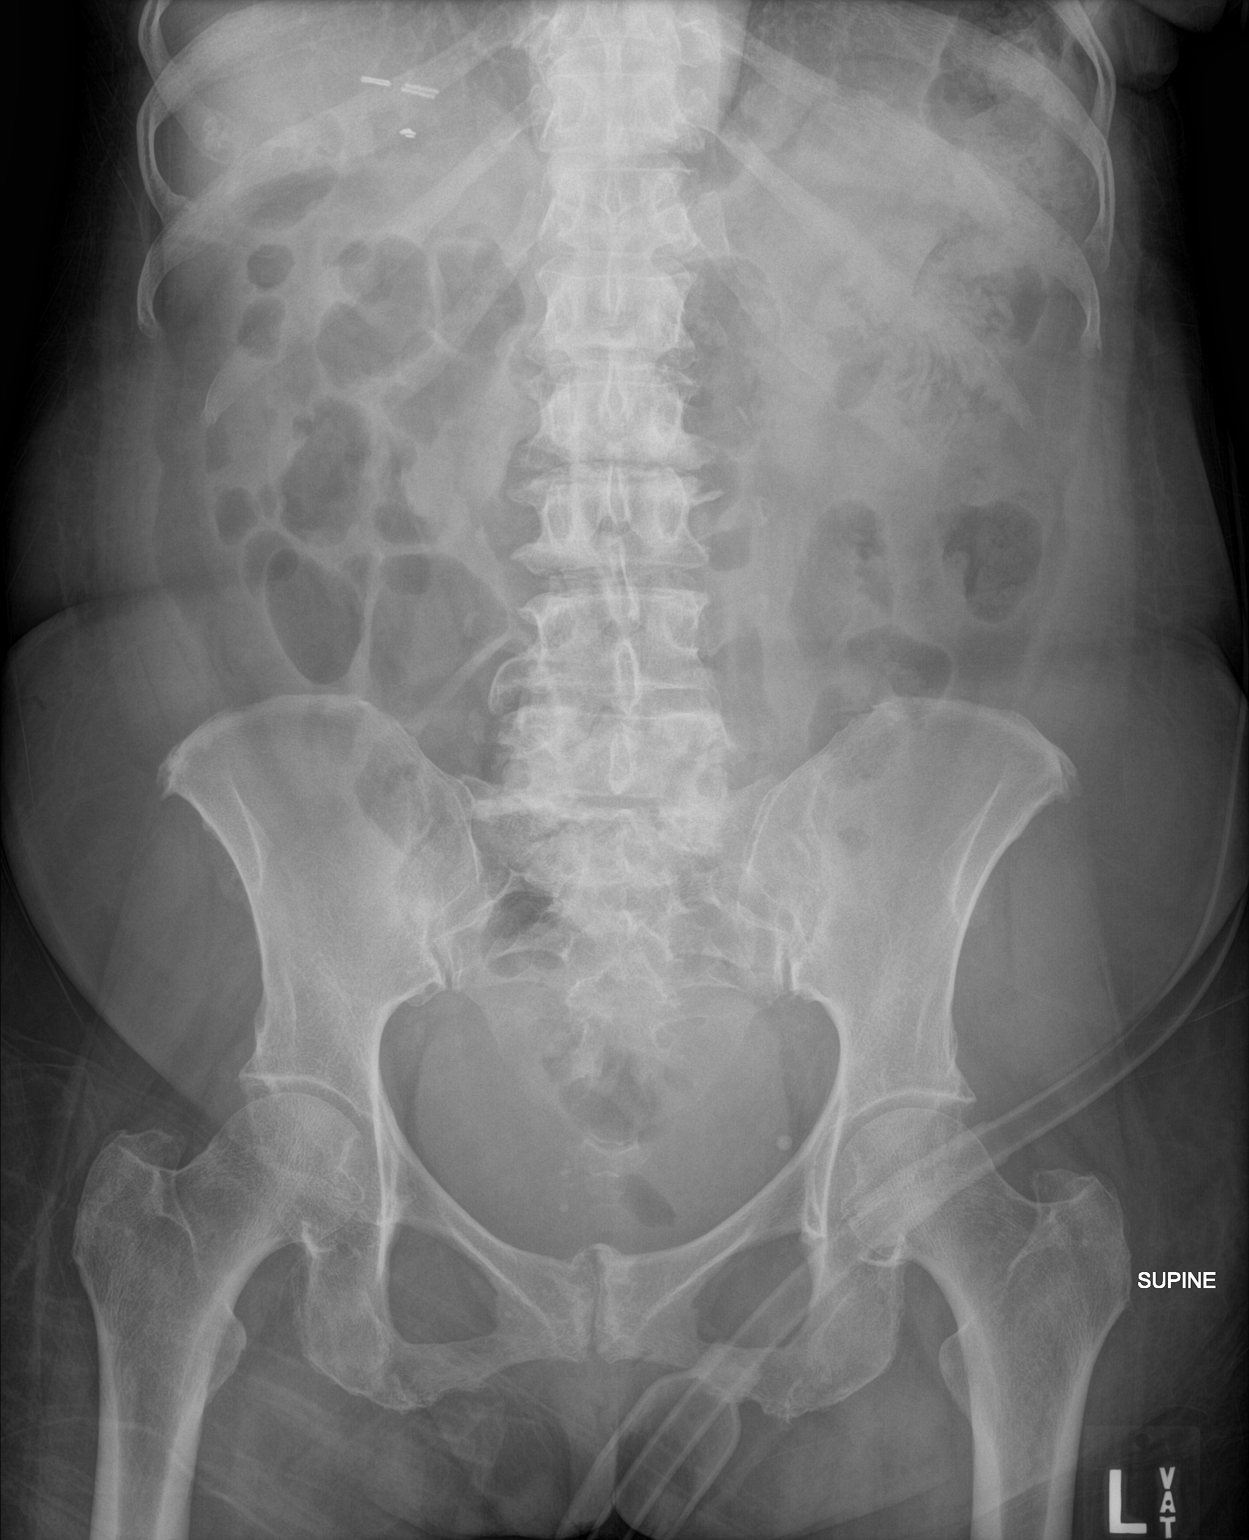

[1 of 1 positions shown; findings below may reference images not displayed]

FINDINGS: Scattered large and small bowel gas without obstructive change. No
free air is noted. No abnormal mass or abnormal calcifications are
seen. Degenerative changes of lumbar spine are noted.
IMPRESSION: No acute abnormality seen.

## 2020-12-20 DIAGNOSIS — K219 Gastro-esophageal reflux disease without esophagitis: Secondary | ICD-10-CM | POA: Diagnosis not present

## 2020-12-20 DIAGNOSIS — H401133 Primary open-angle glaucoma, bilateral, severe stage: Secondary | ICD-10-CM | POA: Diagnosis not present

## 2020-12-20 DIAGNOSIS — E1139 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: Secondary | ICD-10-CM | POA: Diagnosis not present

## 2020-12-20 DIAGNOSIS — I129 Hypertensive chronic kidney disease with stage 1 through stage 4 chronic kidney disease, or unspecified chronic kidney disease: Secondary | ICD-10-CM | POA: Diagnosis not present

## 2020-12-20 DIAGNOSIS — E1122 Type 2 diabetes mellitus with diabetic chronic kidney disease: Secondary | ICD-10-CM | POA: Diagnosis not present

## 2020-12-20 DIAGNOSIS — N183 Chronic kidney disease, stage 3 unspecified: Secondary | ICD-10-CM | POA: Diagnosis not present

## 2020-12-20 DIAGNOSIS — H548 Legal blindness, as defined in USA: Secondary | ICD-10-CM | POA: Diagnosis not present

## 2020-12-20 DIAGNOSIS — E113393 Type 2 diabetes mellitus with moderate nonproliferative diabetic retinopathy without macular edema, bilateral: Secondary | ICD-10-CM | POA: Diagnosis not present

## 2020-12-20 DIAGNOSIS — E440 Moderate protein-calorie malnutrition: Secondary | ICD-10-CM | POA: Diagnosis not present

## 2020-12-24 DIAGNOSIS — N183 Chronic kidney disease, stage 3 unspecified: Secondary | ICD-10-CM | POA: Diagnosis not present

## 2020-12-24 DIAGNOSIS — E1139 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: Secondary | ICD-10-CM | POA: Diagnosis not present

## 2020-12-24 DIAGNOSIS — E113393 Type 2 diabetes mellitus with moderate nonproliferative diabetic retinopathy without macular edema, bilateral: Secondary | ICD-10-CM | POA: Diagnosis not present

## 2020-12-24 DIAGNOSIS — K219 Gastro-esophageal reflux disease without esophagitis: Secondary | ICD-10-CM | POA: Diagnosis not present

## 2020-12-24 DIAGNOSIS — H548 Legal blindness, as defined in USA: Secondary | ICD-10-CM | POA: Diagnosis not present

## 2020-12-24 DIAGNOSIS — E1122 Type 2 diabetes mellitus with diabetic chronic kidney disease: Secondary | ICD-10-CM | POA: Diagnosis not present

## 2020-12-24 DIAGNOSIS — I129 Hypertensive chronic kidney disease with stage 1 through stage 4 chronic kidney disease, or unspecified chronic kidney disease: Secondary | ICD-10-CM | POA: Diagnosis not present

## 2020-12-24 DIAGNOSIS — H401133 Primary open-angle glaucoma, bilateral, severe stage: Secondary | ICD-10-CM | POA: Diagnosis not present

## 2020-12-24 DIAGNOSIS — E440 Moderate protein-calorie malnutrition: Secondary | ICD-10-CM | POA: Diagnosis not present

## 2020-12-26 DIAGNOSIS — E440 Moderate protein-calorie malnutrition: Secondary | ICD-10-CM | POA: Diagnosis not present

## 2020-12-26 DIAGNOSIS — I129 Hypertensive chronic kidney disease with stage 1 through stage 4 chronic kidney disease, or unspecified chronic kidney disease: Secondary | ICD-10-CM | POA: Diagnosis not present

## 2020-12-26 DIAGNOSIS — H548 Legal blindness, as defined in USA: Secondary | ICD-10-CM | POA: Diagnosis not present

## 2020-12-26 DIAGNOSIS — K219 Gastro-esophageal reflux disease without esophagitis: Secondary | ICD-10-CM | POA: Diagnosis not present

## 2020-12-26 DIAGNOSIS — E1122 Type 2 diabetes mellitus with diabetic chronic kidney disease: Secondary | ICD-10-CM | POA: Diagnosis not present

## 2020-12-26 DIAGNOSIS — E113393 Type 2 diabetes mellitus with moderate nonproliferative diabetic retinopathy without macular edema, bilateral: Secondary | ICD-10-CM | POA: Diagnosis not present

## 2020-12-26 DIAGNOSIS — E1139 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: Secondary | ICD-10-CM | POA: Diagnosis not present

## 2020-12-26 DIAGNOSIS — N183 Chronic kidney disease, stage 3 unspecified: Secondary | ICD-10-CM | POA: Diagnosis not present

## 2020-12-26 DIAGNOSIS — H401133 Primary open-angle glaucoma, bilateral, severe stage: Secondary | ICD-10-CM | POA: Diagnosis not present

## 2021-01-04 DIAGNOSIS — E1122 Type 2 diabetes mellitus with diabetic chronic kidney disease: Secondary | ICD-10-CM | POA: Diagnosis not present

## 2021-01-04 DIAGNOSIS — I129 Hypertensive chronic kidney disease with stage 1 through stage 4 chronic kidney disease, or unspecified chronic kidney disease: Secondary | ICD-10-CM | POA: Diagnosis not present

## 2021-01-04 DIAGNOSIS — H548 Legal blindness, as defined in USA: Secondary | ICD-10-CM | POA: Diagnosis not present

## 2021-01-04 DIAGNOSIS — E113393 Type 2 diabetes mellitus with moderate nonproliferative diabetic retinopathy without macular edema, bilateral: Secondary | ICD-10-CM | POA: Diagnosis not present

## 2021-01-04 DIAGNOSIS — H401133 Primary open-angle glaucoma, bilateral, severe stage: Secondary | ICD-10-CM | POA: Diagnosis not present

## 2021-01-04 DIAGNOSIS — E1139 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: Secondary | ICD-10-CM | POA: Diagnosis not present

## 2021-01-04 DIAGNOSIS — E440 Moderate protein-calorie malnutrition: Secondary | ICD-10-CM | POA: Diagnosis not present

## 2021-01-04 DIAGNOSIS — K219 Gastro-esophageal reflux disease without esophagitis: Secondary | ICD-10-CM | POA: Diagnosis not present

## 2021-01-04 DIAGNOSIS — N183 Chronic kidney disease, stage 3 unspecified: Secondary | ICD-10-CM | POA: Diagnosis not present

## 2021-01-16 DIAGNOSIS — H401133 Primary open-angle glaucoma, bilateral, severe stage: Secondary | ICD-10-CM | POA: Diagnosis not present

## 2021-01-16 DIAGNOSIS — H548 Legal blindness, as defined in USA: Secondary | ICD-10-CM | POA: Diagnosis not present

## 2021-01-16 DIAGNOSIS — E113393 Type 2 diabetes mellitus with moderate nonproliferative diabetic retinopathy without macular edema, bilateral: Secondary | ICD-10-CM | POA: Diagnosis not present

## 2021-01-16 DIAGNOSIS — E440 Moderate protein-calorie malnutrition: Secondary | ICD-10-CM | POA: Diagnosis not present

## 2021-01-16 DIAGNOSIS — K219 Gastro-esophageal reflux disease without esophagitis: Secondary | ICD-10-CM | POA: Diagnosis not present

## 2021-01-16 DIAGNOSIS — E1139 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: Secondary | ICD-10-CM | POA: Diagnosis not present

## 2021-01-16 DIAGNOSIS — I129 Hypertensive chronic kidney disease with stage 1 through stage 4 chronic kidney disease, or unspecified chronic kidney disease: Secondary | ICD-10-CM | POA: Diagnosis not present

## 2021-01-16 DIAGNOSIS — N183 Chronic kidney disease, stage 3 unspecified: Secondary | ICD-10-CM | POA: Diagnosis not present

## 2021-01-16 DIAGNOSIS — E1122 Type 2 diabetes mellitus with diabetic chronic kidney disease: Secondary | ICD-10-CM | POA: Diagnosis not present

## 2021-01-18 DIAGNOSIS — K219 Gastro-esophageal reflux disease without esophagitis: Secondary | ICD-10-CM | POA: Diagnosis not present

## 2021-01-18 DIAGNOSIS — E1139 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: Secondary | ICD-10-CM | POA: Diagnosis not present

## 2021-01-18 DIAGNOSIS — E113393 Type 2 diabetes mellitus with moderate nonproliferative diabetic retinopathy without macular edema, bilateral: Secondary | ICD-10-CM | POA: Diagnosis not present

## 2021-01-18 DIAGNOSIS — E440 Moderate protein-calorie malnutrition: Secondary | ICD-10-CM | POA: Diagnosis not present

## 2021-01-18 DIAGNOSIS — E1122 Type 2 diabetes mellitus with diabetic chronic kidney disease: Secondary | ICD-10-CM | POA: Diagnosis not present

## 2021-01-18 DIAGNOSIS — N183 Chronic kidney disease, stage 3 unspecified: Secondary | ICD-10-CM | POA: Diagnosis not present

## 2021-01-18 DIAGNOSIS — H548 Legal blindness, as defined in USA: Secondary | ICD-10-CM | POA: Diagnosis not present

## 2021-01-18 DIAGNOSIS — H401133 Primary open-angle glaucoma, bilateral, severe stage: Secondary | ICD-10-CM | POA: Diagnosis not present

## 2021-01-18 DIAGNOSIS — I129 Hypertensive chronic kidney disease with stage 1 through stage 4 chronic kidney disease, or unspecified chronic kidney disease: Secondary | ICD-10-CM | POA: Diagnosis not present

## 2021-01-24 DIAGNOSIS — K219 Gastro-esophageal reflux disease without esophagitis: Secondary | ICD-10-CM | POA: Diagnosis not present

## 2021-01-24 DIAGNOSIS — E440 Moderate protein-calorie malnutrition: Secondary | ICD-10-CM | POA: Diagnosis not present

## 2021-01-24 DIAGNOSIS — N183 Chronic kidney disease, stage 3 unspecified: Secondary | ICD-10-CM | POA: Diagnosis not present

## 2021-01-24 DIAGNOSIS — E1122 Type 2 diabetes mellitus with diabetic chronic kidney disease: Secondary | ICD-10-CM | POA: Diagnosis not present

## 2021-01-24 DIAGNOSIS — H548 Legal blindness, as defined in USA: Secondary | ICD-10-CM | POA: Diagnosis not present

## 2021-01-24 DIAGNOSIS — E113393 Type 2 diabetes mellitus with moderate nonproliferative diabetic retinopathy without macular edema, bilateral: Secondary | ICD-10-CM | POA: Diagnosis not present

## 2021-01-24 DIAGNOSIS — E1139 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: Secondary | ICD-10-CM | POA: Diagnosis not present

## 2021-01-24 DIAGNOSIS — I129 Hypertensive chronic kidney disease with stage 1 through stage 4 chronic kidney disease, or unspecified chronic kidney disease: Secondary | ICD-10-CM | POA: Diagnosis not present

## 2021-01-24 DIAGNOSIS — H401133 Primary open-angle glaucoma, bilateral, severe stage: Secondary | ICD-10-CM | POA: Diagnosis not present

## 2021-01-28 DIAGNOSIS — S61215A Laceration without foreign body of left ring finger without damage to nail, initial encounter: Secondary | ICD-10-CM | POA: Diagnosis not present

## 2021-01-31 DIAGNOSIS — N183 Chronic kidney disease, stage 3 unspecified: Secondary | ICD-10-CM | POA: Diagnosis not present

## 2021-01-31 DIAGNOSIS — E440 Moderate protein-calorie malnutrition: Secondary | ICD-10-CM | POA: Diagnosis not present

## 2021-01-31 DIAGNOSIS — H401133 Primary open-angle glaucoma, bilateral, severe stage: Secondary | ICD-10-CM | POA: Diagnosis not present

## 2021-01-31 DIAGNOSIS — E1139 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: Secondary | ICD-10-CM | POA: Diagnosis not present

## 2021-01-31 DIAGNOSIS — E559 Vitamin D deficiency, unspecified: Secondary | ICD-10-CM | POA: Diagnosis not present

## 2021-01-31 DIAGNOSIS — H548 Legal blindness, as defined in USA: Secondary | ICD-10-CM | POA: Diagnosis not present

## 2021-01-31 DIAGNOSIS — S61215D Laceration without foreign body of left ring finger without damage to nail, subsequent encounter: Secondary | ICD-10-CM | POA: Diagnosis not present

## 2021-01-31 DIAGNOSIS — Z794 Long term (current) use of insulin: Secondary | ICD-10-CM | POA: Diagnosis not present

## 2021-01-31 DIAGNOSIS — E1122 Type 2 diabetes mellitus with diabetic chronic kidney disease: Secondary | ICD-10-CM | POA: Diagnosis not present

## 2021-01-31 DIAGNOSIS — E785 Hyperlipidemia, unspecified: Secondary | ICD-10-CM | POA: Diagnosis not present

## 2021-01-31 DIAGNOSIS — E1121 Type 2 diabetes mellitus with diabetic nephropathy: Secondary | ICD-10-CM | POA: Diagnosis not present

## 2021-01-31 DIAGNOSIS — E113393 Type 2 diabetes mellitus with moderate nonproliferative diabetic retinopathy without macular edema, bilateral: Secondary | ICD-10-CM | POA: Diagnosis not present

## 2021-01-31 DIAGNOSIS — K219 Gastro-esophageal reflux disease without esophagitis: Secondary | ICD-10-CM | POA: Diagnosis not present

## 2021-01-31 DIAGNOSIS — I129 Hypertensive chronic kidney disease with stage 1 through stage 4 chronic kidney disease, or unspecified chronic kidney disease: Secondary | ICD-10-CM | POA: Diagnosis not present

## 2021-02-07 DIAGNOSIS — I129 Hypertensive chronic kidney disease with stage 1 through stage 4 chronic kidney disease, or unspecified chronic kidney disease: Secondary | ICD-10-CM | POA: Diagnosis not present

## 2021-02-07 DIAGNOSIS — H401133 Primary open-angle glaucoma, bilateral, severe stage: Secondary | ICD-10-CM | POA: Diagnosis not present

## 2021-02-07 DIAGNOSIS — E1122 Type 2 diabetes mellitus with diabetic chronic kidney disease: Secondary | ICD-10-CM | POA: Diagnosis not present

## 2021-02-07 DIAGNOSIS — N183 Chronic kidney disease, stage 3 unspecified: Secondary | ICD-10-CM | POA: Diagnosis not present

## 2021-02-07 DIAGNOSIS — E440 Moderate protein-calorie malnutrition: Secondary | ICD-10-CM | POA: Diagnosis not present

## 2021-02-07 DIAGNOSIS — K219 Gastro-esophageal reflux disease without esophagitis: Secondary | ICD-10-CM | POA: Diagnosis not present

## 2021-02-07 DIAGNOSIS — H548 Legal blindness, as defined in USA: Secondary | ICD-10-CM | POA: Diagnosis not present

## 2021-02-07 DIAGNOSIS — E1139 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: Secondary | ICD-10-CM | POA: Diagnosis not present

## 2021-02-07 DIAGNOSIS — E113393 Type 2 diabetes mellitus with moderate nonproliferative diabetic retinopathy without macular edema, bilateral: Secondary | ICD-10-CM | POA: Diagnosis not present

## 2021-02-20 DIAGNOSIS — H35372 Puckering of macula, left eye: Secondary | ICD-10-CM | POA: Diagnosis not present

## 2021-02-20 DIAGNOSIS — E1139 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: Secondary | ICD-10-CM | POA: Diagnosis not present

## 2021-02-20 DIAGNOSIS — H04123 Dry eye syndrome of bilateral lacrimal glands: Secondary | ICD-10-CM | POA: Diagnosis not present

## 2021-02-20 DIAGNOSIS — H47233 Glaucomatous optic atrophy, bilateral: Secondary | ICD-10-CM | POA: Diagnosis not present

## 2021-02-20 DIAGNOSIS — E113392 Type 2 diabetes mellitus with moderate nonproliferative diabetic retinopathy without macular edema, left eye: Secondary | ICD-10-CM | POA: Diagnosis not present

## 2021-02-20 DIAGNOSIS — H401133 Primary open-angle glaucoma, bilateral, severe stage: Secondary | ICD-10-CM | POA: Diagnosis not present

## 2021-02-22 DIAGNOSIS — E113393 Type 2 diabetes mellitus with moderate nonproliferative diabetic retinopathy without macular edema, bilateral: Secondary | ICD-10-CM | POA: Diagnosis not present

## 2021-02-22 DIAGNOSIS — H401133 Primary open-angle glaucoma, bilateral, severe stage: Secondary | ICD-10-CM | POA: Diagnosis not present

## 2021-02-22 DIAGNOSIS — H548 Legal blindness, as defined in USA: Secondary | ICD-10-CM | POA: Diagnosis not present

## 2021-02-22 DIAGNOSIS — I129 Hypertensive chronic kidney disease with stage 1 through stage 4 chronic kidney disease, or unspecified chronic kidney disease: Secondary | ICD-10-CM | POA: Diagnosis not present

## 2021-02-22 DIAGNOSIS — E1122 Type 2 diabetes mellitus with diabetic chronic kidney disease: Secondary | ICD-10-CM | POA: Diagnosis not present

## 2021-02-22 DIAGNOSIS — N183 Chronic kidney disease, stage 3 unspecified: Secondary | ICD-10-CM | POA: Diagnosis not present

## 2021-02-22 DIAGNOSIS — E1139 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: Secondary | ICD-10-CM | POA: Diagnosis not present

## 2021-02-22 DIAGNOSIS — K219 Gastro-esophageal reflux disease without esophagitis: Secondary | ICD-10-CM | POA: Diagnosis not present

## 2021-02-22 DIAGNOSIS — E440 Moderate protein-calorie malnutrition: Secondary | ICD-10-CM | POA: Diagnosis not present

## 2021-03-04 ENCOUNTER — Other Ambulatory Visit: Payer: Self-pay

## 2021-03-04 ENCOUNTER — Ambulatory Visit (INDEPENDENT_AMBULATORY_CARE_PROVIDER_SITE_OTHER): Payer: Medicare PPO | Admitting: Podiatry

## 2021-03-04 DIAGNOSIS — M79674 Pain in right toe(s): Secondary | ICD-10-CM

## 2021-03-04 DIAGNOSIS — M79675 Pain in left toe(s): Secondary | ICD-10-CM | POA: Diagnosis not present

## 2021-03-04 DIAGNOSIS — B351 Tinea unguium: Secondary | ICD-10-CM

## 2021-03-04 DIAGNOSIS — E119 Type 2 diabetes mellitus without complications: Secondary | ICD-10-CM | POA: Diagnosis not present

## 2021-03-10 ENCOUNTER — Encounter: Payer: Self-pay | Admitting: Podiatry

## 2021-03-10 NOTE — Progress Notes (Signed)
°  Subjective:  Patient ID: Adrienne Gonzalez, female    DOB: March 26, 1925,  MRN: [id number]  Adrienne Gonzalez presents to clinic today for preventative diabetic foot care and painful thick toenails that are difficult to trim. Pain interferes with ambulation. Aggravating factors include wearing enclosed shoe gear. Pain is relieved with periodic professional debridement.  Patient states blood glucose was 135 mg/dl today.   New problem(s): None.   PCP is Laurann Montana, MD , and last visit was January 31, 2021.  Allergies  Allergen Reactions   Benzocaine Other (See Comments)    confusion   Metformin And Related Diarrhea   Morphine And Related Itching   Olmesartan     Other reaction(s): diarrhea   Olmesartan Medoxomil-Hctz Diarrhea    Review of Systems: Negative except as noted in the HPI. Objective:   Constitutional Adrienne Gonzalez is a pleasant 88 y.o. African American female, WD, WN in NAD. AAO x 3.   Vascular Capillary refill time to digits immediate b/l. Palpable pedal pulses b/l LE. Pedal hair absent. No pain with calf compression b/l. Lower extremity skin temperature gradient within normal limits. No edema noted b/l LE. No cyanosis or clubbing noted b/l LE.  Neurologic Normal speech. Oriented to person, place, and time. Protective sensation intact 5/5 intact bilaterally with 10g monofilament b/l. Vibratory sensation intact b/l.  Dermatologic Pedal skin is warm and supple b/l LE. No open wounds b/l LE. No interdigital macerations noted b/l LE. Toenails 1-5 b/l elongated, discolored, dystrophic, thickened, crumbly with subungual debris and tenderness to dorsal palpation.  Orthopedic: Normal muscle strength 5/5 to all lower extremity muscle groups bilaterally. Hammertoe deformity noted 2-5 b/l.Marland Kitchen No pain, crepitus or joint limitation noted with ROM b/l LE.  Patient ambulates independently without assistive aids.   Radiographs: None Assessment:   1. Pain due to onychomycosis of toenails of both feet   2.  Controlled type 2 diabetes mellitus without complication, without long-term current use of insulin (HCC)    Plan:  Patient was evaluated and treated and all questions answered. Consent given for treatment as described below: -Toenails 1-5 b/l were debrided in length and girth with sterile nail nippers and dremel without iatrogenic bleeding.  -Patient/POA to call should there be question/concern in the interim.  Return in about 3 months (around 06/01/2021).  Adrienne Gonzalez, DPM

## 2021-03-13 DIAGNOSIS — E1139 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: Secondary | ICD-10-CM | POA: Diagnosis not present

## 2021-03-13 DIAGNOSIS — H04123 Dry eye syndrome of bilateral lacrimal glands: Secondary | ICD-10-CM | POA: Diagnosis not present

## 2021-03-13 DIAGNOSIS — H401133 Primary open-angle glaucoma, bilateral, severe stage: Secondary | ICD-10-CM | POA: Diagnosis not present

## 2021-03-13 DIAGNOSIS — H35372 Puckering of macula, left eye: Secondary | ICD-10-CM | POA: Diagnosis not present

## 2021-03-13 DIAGNOSIS — E113392 Type 2 diabetes mellitus with moderate nonproliferative diabetic retinopathy without macular edema, left eye: Secondary | ICD-10-CM | POA: Diagnosis not present

## 2021-03-13 DIAGNOSIS — H47233 Glaucomatous optic atrophy, bilateral: Secondary | ICD-10-CM | POA: Diagnosis not present

## 2021-05-08 DIAGNOSIS — E559 Vitamin D deficiency, unspecified: Secondary | ICD-10-CM | POA: Diagnosis not present

## 2021-05-08 DIAGNOSIS — N1832 Chronic kidney disease, stage 3b: Secondary | ICD-10-CM | POA: Diagnosis not present

## 2021-05-08 DIAGNOSIS — H548 Legal blindness, as defined in USA: Secondary | ICD-10-CM | POA: Diagnosis not present

## 2021-05-08 DIAGNOSIS — I129 Hypertensive chronic kidney disease with stage 1 through stage 4 chronic kidney disease, or unspecified chronic kidney disease: Secondary | ICD-10-CM | POA: Diagnosis not present

## 2021-05-08 DIAGNOSIS — G479 Sleep disorder, unspecified: Secondary | ICD-10-CM | POA: Diagnosis not present

## 2021-05-08 DIAGNOSIS — E1121 Type 2 diabetes mellitus with diabetic nephropathy: Secondary | ICD-10-CM | POA: Diagnosis not present

## 2021-05-08 DIAGNOSIS — E785 Hyperlipidemia, unspecified: Secondary | ICD-10-CM | POA: Diagnosis not present

## 2021-05-09 DIAGNOSIS — E1121 Type 2 diabetes mellitus with diabetic nephropathy: Secondary | ICD-10-CM | POA: Diagnosis not present

## 2021-05-09 DIAGNOSIS — E785 Hyperlipidemia, unspecified: Secondary | ICD-10-CM | POA: Diagnosis not present

## 2021-05-09 DIAGNOSIS — E559 Vitamin D deficiency, unspecified: Secondary | ICD-10-CM | POA: Diagnosis not present

## 2021-05-21 DIAGNOSIS — T148XXA Other injury of unspecified body region, initial encounter: Secondary | ICD-10-CM | POA: Diagnosis not present

## 2021-05-22 DIAGNOSIS — H35372 Puckering of macula, left eye: Secondary | ICD-10-CM | POA: Diagnosis not present

## 2021-05-22 DIAGNOSIS — H04123 Dry eye syndrome of bilateral lacrimal glands: Secondary | ICD-10-CM | POA: Diagnosis not present

## 2021-05-22 DIAGNOSIS — H47233 Glaucomatous optic atrophy, bilateral: Secondary | ICD-10-CM | POA: Diagnosis not present

## 2021-05-22 DIAGNOSIS — E113392 Type 2 diabetes mellitus with moderate nonproliferative diabetic retinopathy without macular edema, left eye: Secondary | ICD-10-CM | POA: Diagnosis not present

## 2021-05-22 DIAGNOSIS — H401133 Primary open-angle glaucoma, bilateral, severe stage: Secondary | ICD-10-CM | POA: Diagnosis not present

## 2021-05-22 DIAGNOSIS — E1139 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: Secondary | ICD-10-CM | POA: Diagnosis not present

## 2021-06-17 ENCOUNTER — Ambulatory Visit: Payer: Medicare PPO | Admitting: Podiatry

## 2021-06-17 ENCOUNTER — Encounter: Payer: Self-pay | Admitting: Podiatry

## 2021-06-17 DIAGNOSIS — M79674 Pain in right toe(s): Secondary | ICD-10-CM | POA: Diagnosis not present

## 2021-06-17 DIAGNOSIS — B351 Tinea unguium: Secondary | ICD-10-CM | POA: Diagnosis not present

## 2021-06-17 DIAGNOSIS — M79675 Pain in left toe(s): Secondary | ICD-10-CM

## 2021-06-17 DIAGNOSIS — E119 Type 2 diabetes mellitus without complications: Secondary | ICD-10-CM

## 2021-06-17 DIAGNOSIS — E1121 Type 2 diabetes mellitus with diabetic nephropathy: Secondary | ICD-10-CM

## 2021-06-23 NOTE — Progress Notes (Signed)
  Subjective:  Patient ID: Adrienne Gonzalez, female    DOB: [birth year],  MRN: [id number]  Adrienne Gonzalez presents to clinic today for at risk foot care. Pt has h/o NIDDM with chronic kidney disease and painful elongated mycotic toenails 1-5 bilaterally which are tender when wearing enclosed shoe gear. Pain is relieved with periodic professional debridement.  Patient states blood glucose was 105 mg/dl today.  Last known HgA1c was around 7%.  New problem(s): None.   PCP is Laurann Montana, MD , and last visit was March, 2023.  Her daughter is present during today's visit.  Allergies  Allergen Reactions   Benzocaine Other (See Comments)    confusion   Metformin And Related Diarrhea   Morphine And Related Itching   Olmesartan     Other reaction(s): diarrhea   Olmesartan Medoxomil-Hctz Diarrhea    Review of Systems: Negative except as noted in the HPI.  Objective: No changes noted in today's physical examination.  Constitutional Adrienne Gonzalez is a pleasant 88 y.o. African American female, WD, WN in NAD. AAO x 3.   Vascular Capillary refill time to digits immediate b/l. Palpable pedal pulses b/l LE. Pedal hair absent. No pain with calf compression b/l. Lower extremity skin temperature gradient within normal limits. No edema noted b/l LE. No cyanosis or clubbing noted b/l LE.  Neurologic Normal speech. Oriented to person, place, and time. Protective sensation intact 5/5 intact bilaterally with 10g monofilament b/l. Vibratory sensation intact b/l.  Dermatologic Pedal skin is warm and supple b/l LE. No open wounds b/l LE. No interdigital macerations noted b/l LE. Toenails 1-5 b/l elongated, discolored, dystrophic, thickened, crumbly with subungual debris and tenderness to dorsal palpation.  Orthopedic: Normal muscle strength 5/5 to all lower extremity muscle groups bilaterally. Hammertoe deformity noted 2-5 b/l.Marland Kitchen No pain, crepitus or joint limitation noted with ROM b/l LE.  Patient ambulates independently  without assistive aids.   Radiographs: None Assessment/Plan: 1. Pain due to onychomycosis of toenails of both feet   2. Diabetic nephropathy associated with type 2 diabetes mellitus ([facility])     -Examined patient. -No new findings. No new orders. -Mycotic toenails 1-5 bilaterally were debrided in length and girth with sterile nail nippers and dremel without incident. -Patient/POA to call should there be question/concern in the interim.   Return in about 3 months (around 09/17/2021).  [name], DPM

## 2021-07-29 ENCOUNTER — Encounter (INDEPENDENT_AMBULATORY_CARE_PROVIDER_SITE_OTHER): Payer: Self-pay | Admitting: Ophthalmology

## 2021-07-29 ENCOUNTER — Ambulatory Visit (INDEPENDENT_AMBULATORY_CARE_PROVIDER_SITE_OTHER): Payer: Medicare PPO | Admitting: Ophthalmology

## 2021-07-29 DIAGNOSIS — E113393 Type 2 diabetes mellitus with moderate nonproliferative diabetic retinopathy without macular edema, bilateral: Secondary | ICD-10-CM

## 2021-07-29 DIAGNOSIS — H401133 Primary open-angle glaucoma, bilateral, severe stage: Secondary | ICD-10-CM | POA: Diagnosis not present

## 2021-07-29 NOTE — Patient Instructions (Signed)
Patient to report any major discomfort to the eyes to Dr. Harlon Flor

## 2021-07-29 NOTE — Assessment & Plan Note (Addendum)
Patient reports lost her vision last year on advancing glaucoma and remains under the care of Dr. Harlon Flor.  With absolute glaucoma there is no hope for improved acuity no specific therapy warranted

## 2021-07-29 NOTE — Assessment & Plan Note (Signed)
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  Thus we will not need further retinal evaluations in the future.  Follow-up Dr. Harlon Flor as scheduled here as needed

## 2021-07-29 NOTE — Progress Notes (Signed)
07/29/2021     CHIEF COMPLAINT Patient presents for  Chief Complaint  Patient presents with   Retina Evaluation      HISTORY OF PRESENT ILLNESS: Adrienne Gonzalez is a 88 y.o. female who presents to the clinic today for:   HPI     Retina Evaluation           Laterality: both eyes         Comments   Over the last 2 years patient family reports that she did lose her vision last year likely from absolute glaucoma progression despite all tolerated medical and prior therapy and under the care of Dr. Venetia Maxon      Last edited by Hurman Horn, MD on 07/29/2021  2:10 PM.      Referring physician: Marylynn Pearson, MD [place],  [place] [id number]  HISTORICAL INFORMATION:   Selected notes from the MEDICAL RECORD NUMBER    Lab Results  Component Value Date   HGBA1C 6.4 (H) 01/10/2018     CURRENT MEDICATIONS: Current Outpatient Medications (Ophthalmic Drugs)  Medication Sig   ALPHAGAN P 0.1 % SOLN Apply 1 drop to eye 3 (three) times daily.   Brimonidine Tartrate (ALPHAGAN P OP) Apply to eye.   dorzolamide-timolol (COSOPT) 22.3-6.8 MG/ML ophthalmic solution Place 1 drop into both eyes 2 (two) times daily.   LUMIGAN 0.01 % SOLN SMARTSIG:1 Drop(s) In Eye(s) Every Evening   RHOPRESSA 0.02 % SOLN Apply to eye.   VYZULTA 0.024 % SOLN    No current facility-administered medications for this visit. (Ophthalmic Drugs)   Current Outpatient Medications (Other)  Medication Sig   ACCU-CHEK GUIDE test strip    Accu-Chek Softclix Lancets lancets    amLODipine (NORVASC) 5 MG tablet Take 1 tablet (5 mg total) by mouth daily.   aspirin 81 MG EC tablet Take 81 mg by mouth daily. Swallow whole.   B-D ULTRAFINE III SHORT PEN 31G X 8 MM MISC    Blood Glucose Monitoring Suppl (ACCU-CHEK GUIDE) w/Device KIT    cholecalciferol (VITAMIN D3) 25 MCG (1000 UT) tablet Take 1,000 Units by mouth daily.   colestipol (COLESTID) 1 g tablet Take 1 tablet (1 g total) by mouth 2 (two)  times daily.   Insulin Aspart Prot & Aspart (NOVOLOG MIX 70/30 [place]) Inject into the skin.   pravastatin (PRAVACHOL) 40 MG tablet Take 40 mg by mouth daily.   No current facility-administered medications for this visit. (Other)      REVIEW OF SYSTEMS: ROS   Negative for: Constitutional, Gastrointestinal, Neurological, Skin, Genitourinary, Musculoskeletal, HENT, Endocrine, Cardiovascular, Eyes, Respiratory, Psychiatric, Allergic/Imm, Heme/Lymph Last edited by Hurman Horn, MD on 07/29/2021  2:10 PM.       ALLERGIES Allergies  Allergen Reactions   Benzocaine Other (See Comments)    confusion   Metformin And Related Diarrhea   Morphine And Related Itching   Olmesartan     Other reaction(s): diarrhea   Olmesartan Medoxomil-Hctz Diarrhea    PAST MEDICAL HISTORY Past Medical History:  Diagnosis Date   Chronic renal insufficiency, stage 1    Diabetes mellitus without complication ([place])    Hypertension    Past Surgical History:  Procedure Laterality Date   BIOPSY  01/19/2018   Procedure: BIOPSY;  Surgeon: Otis Brace, MD;  Location: [facility];  Service: Gastroenterology;;   FLEXIBLE SIGMOIDOSCOPY N/A 01/19/2018   Procedure: FLEXIBLE SIGMOIDOSCOPY;  Surgeon: Otis Brace, MD;  Location: [facility];  Service: Gastroenterology;  Laterality: N/A;    FAMILY HISTORY History reviewed. No pertinent family history.  SOCIAL HISTORY Social History   Tobacco Use   Smoking status: Never   Smokeless tobacco: Never  Vaping Use   Vaping Use: Never used  Substance Use Topics   Alcohol use: Never   Drug use: Never         OPHTHALMIC EXAM:  Base Eye Exam     Visual Acuity (ETDRS)       Right Left   [employer] with no projection         Tonometry (Tonopen, 2:13 PM)       Right Left   Pressure 22 17         Pupils   No reaction to bright light        Neuro/Psych     Oriented x3: Yes   Mood/Affect: Normal           Slit Lamp and  Fundus Exam     Slit Lamp Exam       Right Left   Lids/Lashes Normal Normal   Conjunctiva/Sclera White and quiet White and quiet   Cornea Clear Clear   Anterior Chamber Deep and quiet Deep and quiet   Iris Lites poorly, old posterior synechiae Lites poorly, old posterior synechiae   Lens Posterior chamber intraocular lens, Centered posterior chamber intraocular lens Posterior chamber intraocular lens, Centered posterior chamber intraocular lens   Anterior Vitreous Normal Normal         Fundus Exam       Right Left   Posterior Vitreous Clear media Clear media   Disc 4+ Optic disc atrophy, 4+ Pallor 4+ Optic disc atrophy, 4+ Pallor   C/D Ratio 1.0 1.0   Macula Normal Normal   Vessels NPDR- Mild NPDR- Mild   Periphery Normal, 28 diopter and 90 diopter exam Normal, 28 diopter and 90 diopter exam            IMAGING AND PROCEDURES  Imaging and Procedures for 07/29/21           ASSESSMENT/PLAN:  Primary open angle glaucoma of both eyes, severe stage Patient reports lost her vision last year on advancing glaucoma and remains under the care of Dr. Venetia Maxon.  With absolute glaucoma there is no hope for improved acuity no specific therapy warranted  Moderate nonproliferative diabetic retinopathy of both eyes (HCC) Absolute glaucoma leads to loss of RNFL and minimal chance for development of diabetic retinopathy.  Thus we will not need further retinal evaluations in the future.  Follow-up Dr. Venetia Maxon as scheduled here as needed     ICD-10-CM   1. Primary open angle glaucoma of both eyes, severe stage  H40.1133     2. Moderate nonproliferative diabetic retinopathy of both eyes associated with type 2 diabetes mellitus, macular edema presence unspecified ([place])  F57.3220       1.  Absolute glaucoma.  OU.  Profound vision loss.  No hope for recovery of vision.  2.  Follow-up with Dr. Venetia Maxon and here on a as needed basis.  3.  Goal henceforth is simply patient  comfort  Ophthalmic Meds Ordered this visit:  No orders of the defined types were placed in this encounter.      Return if symptoms worsen or fail to improve.  Patient Instructions  Patient to report any major discomfort to the eyes to Dr. Venetia Maxon   Explained the diagnoses, plan, and follow up with the patient and they  expressed understanding.  Patient expressed understanding of the importance of proper follow up care.   Clent Demark Areil Ottey M.D. Diseases & Surgery of the Retina and Vitreous [facility]ina [facility] Diabetic [place] 07/29/21     Abbreviations: M myopia (nearsighted); A astigmatism; H hyperopia (farsighted); P presbyopia; Mrx spectacle prescription;  CTL contact lenses; OD right eye; OS left eye; OU both eyes  XT exotropia; ET esotropia; PEK punctate epithelial keratitis; PEE punctate epithelial erosions; DES dry eye syndrome; MGD meibomian gland dysfunction; ATs artificial tears; PFAT's preservative free artificial tears; [place] nuclear sclerotic cataract; PSC posterior subcapsular cataract; ERM epi-retinal membrane; PVD posterior vitreous detachment; RD retinal detachment; DM diabetes mellitus; DR diabetic retinopathy; NPDR non-proliferative diabetic retinopathy; PDR proliferative diabetic retinopathy; CSME clinically significant macular edema; DME diabetic macular edema; dbh dot blot hemorrhages; CWS cotton wool spot; POAG primary open angle glaucoma; C/D cup-to-disc ratio; HVF humphrey visual field; GVF goldmann visual field; OCT optical coherence tomography; IOP intraocular pressure; BRVO Branch retinal vein occlusion; CRVO central retinal vein occlusion; CRAO central retinal artery occlusion; BRAO branch retinal artery occlusion; RT retinal tear; SB scleral buckle; PPV pars plana vitrectomy; VH Vitreous hemorrhage; PRP panretinal laser photocoagulation; IVK intravitreal kenalog; VMT vitreomacular traction; MH Macular hole;  NVD neovascularization of the disc; NVE neovascularization  elsewhere; AREDS age related eye disease study; ARMD age related macular degeneration; POAG primary open angle glaucoma; EBMD epithelial/anterior basement membrane dystrophy; ACIOL anterior chamber intraocular lens; IOL intraocular lens; PCIOL posterior chamber intraocular lens; Phaco/IOL phacoemulsification with intraocular lens placement; [place] photorefractive keratectomy; LASIK laser assisted in situ keratomileusis; HTN hypertension; DM diabetes mellitus; COPD chronic obstructive pulmonary disease

## 2021-08-01 DIAGNOSIS — R109 Unspecified abdominal pain: Secondary | ICD-10-CM | POA: Diagnosis not present

## 2021-08-01 DIAGNOSIS — R399 Unspecified symptoms and signs involving the genitourinary system: Secondary | ICD-10-CM | POA: Diagnosis not present

## 2021-08-01 DIAGNOSIS — Z8639 Personal history of other endocrine, nutritional and metabolic disease: Secondary | ICD-10-CM | POA: Diagnosis not present

## 2021-08-21 DIAGNOSIS — R112 Nausea with vomiting, unspecified: Secondary | ICD-10-CM | POA: Diagnosis not present

## 2021-09-11 DIAGNOSIS — I129 Hypertensive chronic kidney disease with stage 1 through stage 4 chronic kidney disease, or unspecified chronic kidney disease: Secondary | ICD-10-CM | POA: Diagnosis not present

## 2021-09-11 DIAGNOSIS — D631 Anemia in chronic kidney disease: Secondary | ICD-10-CM | POA: Diagnosis not present

## 2021-09-11 DIAGNOSIS — E1122 Type 2 diabetes mellitus with diabetic chronic kidney disease: Secondary | ICD-10-CM | POA: Diagnosis not present

## 2021-09-11 DIAGNOSIS — N2581 Secondary hyperparathyroidism of renal origin: Secondary | ICD-10-CM | POA: Diagnosis not present

## 2021-09-11 DIAGNOSIS — N1832 Chronic kidney disease, stage 3b: Secondary | ICD-10-CM | POA: Diagnosis not present

## 2021-09-12 DIAGNOSIS — Z23 Encounter for immunization: Secondary | ICD-10-CM | POA: Diagnosis not present

## 2021-09-12 DIAGNOSIS — E785 Hyperlipidemia, unspecified: Secondary | ICD-10-CM | POA: Diagnosis not present

## 2021-09-12 DIAGNOSIS — E1121 Type 2 diabetes mellitus with diabetic nephropathy: Secondary | ICD-10-CM | POA: Diagnosis not present

## 2021-09-12 DIAGNOSIS — R413 Other amnesia: Secondary | ICD-10-CM | POA: Diagnosis not present

## 2021-09-12 DIAGNOSIS — I129 Hypertensive chronic kidney disease with stage 1 through stage 4 chronic kidney disease, or unspecified chronic kidney disease: Secondary | ICD-10-CM | POA: Diagnosis not present

## 2021-09-12 DIAGNOSIS — H548 Legal blindness, as defined in USA: Secondary | ICD-10-CM | POA: Diagnosis not present

## 2021-09-12 DIAGNOSIS — N1832 Chronic kidney disease, stage 3b: Secondary | ICD-10-CM | POA: Diagnosis not present

## 2021-09-16 ENCOUNTER — Telehealth: Payer: Self-pay

## 2021-09-16 NOTE — Telephone Encounter (Signed)
Attempted to contact patient's daughter Bennetta Laos to schedule a Palliative Care consult appointment. No answer left a message to return call.

## 2021-09-16 NOTE — Telephone Encounter (Signed)
Spoke with patient's daughter Bennetta Laos and scheduled a telephonic Palliative Consult for 09/30/21 @ 2:30 PM.  Consent obtained; updated Netsmart, Team List and Epic.

## 2021-09-17 DIAGNOSIS — N1832 Chronic kidney disease, stage 3b: Secondary | ICD-10-CM | POA: Diagnosis not present

## 2021-09-17 DIAGNOSIS — E1121 Type 2 diabetes mellitus with diabetic nephropathy: Secondary | ICD-10-CM | POA: Diagnosis not present

## 2021-09-23 ENCOUNTER — Encounter: Payer: Self-pay | Admitting: Podiatry

## 2021-09-23 ENCOUNTER — Ambulatory Visit: Payer: Medicare PPO | Admitting: Podiatry

## 2021-09-23 DIAGNOSIS — E119 Type 2 diabetes mellitus without complications: Secondary | ICD-10-CM

## 2021-09-23 DIAGNOSIS — B351 Tinea unguium: Secondary | ICD-10-CM

## 2021-09-23 DIAGNOSIS — M2011 Hallux valgus (acquired), right foot: Secondary | ICD-10-CM | POA: Diagnosis not present

## 2021-09-23 DIAGNOSIS — M2012 Hallux valgus (acquired), left foot: Secondary | ICD-10-CM

## 2021-09-23 DIAGNOSIS — E1121 Type 2 diabetes mellitus with diabetic nephropathy: Secondary | ICD-10-CM | POA: Diagnosis not present

## 2021-09-23 DIAGNOSIS — M79675 Pain in left toe(s): Secondary | ICD-10-CM

## 2021-09-23 DIAGNOSIS — M2041 Other hammer toe(s) (acquired), right foot: Secondary | ICD-10-CM | POA: Diagnosis not present

## 2021-09-23 DIAGNOSIS — M79674 Pain in right toe(s): Secondary | ICD-10-CM | POA: Diagnosis not present

## 2021-09-23 DIAGNOSIS — M2042 Other hammer toe(s) (acquired), left foot: Secondary | ICD-10-CM

## 2021-09-23 NOTE — Progress Notes (Signed)
ANNUAL DIABETIC FOOT EXAM  Subjective: Adrienne Gonzalez presents today for annual diabetic foot examination.  Patient confirms h/o diabetes.  Patient relates 23 year h/o diabetes.  Patient denies any h/o foot wounds.  Patient denies any numbness, tingling, burning, or pins/needle sensation in feet.  Patient complains of discomfort of right great toe where she has thickened, elongated toenail.  Patient's blood sugar was 131 mg/dl today. Last known  HgA1c was in the 7% range.   Risk factors: diabetes, HTN, diabetic renal disease.  She is accompanied by her daughter on today's visit.  Harlan Stains, MD is patient's PCP. Last visit was September 12, 2021.  Past Medical History:  Diagnosis Date   Chronic renal insufficiency, stage 1    Diabetes mellitus without complication ([place])    Hypertension    Patient Active Problem List   Diagnosis Date Noted   Legal blindness, as defined in [place] 11/20/2020   Unqualified visual loss, both eyes 11/20/2020   Diabetic renal disease ([place]) 05/15/2020   Gastroesophageal reflux disease 05/15/2020   Gastroparesis 05/15/2020   Hyperlipidemia 05/15/2020   Hyperparathyroidism due to renal insufficiency ([place]) 05/15/2020   Long term (current) use of insulin ([place]) 05/15/2020   Malnutrition of moderate degree ([place]: 60% to less than 75% of standard weight) ([place]) 05/15/2020   Other specified disorders of bone density and structure, other site 05/15/2020   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 ([place]) 83/41/9622   Vitamin D deficiency 05/15/2020   Right epiretinal membrane 07/27/2019   Primary open angle glaucoma of both eyes, severe stage 07/27/2019   Left epiretinal membrane 07/27/2019   Moderate nonproliferative diabetic retinopathy of both eyes ([place]) 07/27/2019   N&V (nausea and vomiting)    FTT (failure to thrive) in adult    Hypokalemia    CKD (chronic kidney disease), stage III (HCC)    Chronic renal insufficiency,  stage 1 29/79/8921   Metabolic acidosis 19/41/7408   Hypoglycemia 01/08/2018   Diarrhea 01/08/2018   AKI (acute kidney injury) ([place]) 01/08/2018   Diabetes mellitus without complication ([place])    Hypertension    Past Surgical History:  Procedure Laterality Date   BIOPSY  01/19/2018   Procedure: BIOPSY;  Surgeon: Otis Brace, MD;  Location: [facility];  Service: Gastroenterology;;   FLEXIBLE SIGMOIDOSCOPY N/A 01/19/2018   Procedure: FLEXIBLE SIGMOIDOSCOPY;  Surgeon: Otis Brace, MD;  Location: [facility];  Service: Gastroenterology;  Laterality: N/A;   Current Outpatient Medications on File Prior to Visit  Medication Sig Dispense Refill   ACCU-CHEK GUIDE test strip      Accu-Chek Softclix Lancets lancets      ALPHAGAN P 0.1 % SOLN Apply 1 drop to eye 3 (three) times daily.     amLODipine (NORVASC) 5 MG tablet Take 1 tablet (5 mg total) by mouth daily. 30 tablet 0   aspirin 81 MG EC tablet Take 81 mg by mouth daily. Swallow whole.     B-D ULTRAFINE III SHORT PEN 31G X 8 MM MISC      Blood Glucose Monitoring Suppl (ACCU-CHEK GUIDE) w/Device KIT      Brimonidine Tartrate (ALPHAGAN P OP) Apply to eye.     cholecalciferol (VITAMIN D3) 25 MCG (1000 UT) tablet Take 1,000 Units by mouth daily.     colestipol (COLESTID) 1 g tablet Take 1 tablet (1 g total) by mouth 2 (two) times daily. 30 tablet 0   dorzolamide-timolol (COSOPT) 22.3-6.8 MG/ML ophthalmic solution Place 1 drop into both eyes 2 (two)  times daily.     Insulin Aspart Prot & Aspart (NOVOLOG MIX 70/30 [place]) Inject into the skin.     LUMIGAN 0.01 % SOLN SMARTSIG:1 Drop(s) In Eye(s) Every Evening     pravastatin (PRAVACHOL) 40 MG tablet Take 40 mg by mouth daily.     RHOPRESSA 0.02 % SOLN Apply to eye.     sucralfate (CARAFATE) 1 g tablet Take 1 g by mouth 2 (two) times daily.     VYZULTA 0.024 % SOLN      No current facility-administered medications on file prior to visit.    Allergies  Allergen Reactions   Benzocaine  Other (See Comments)    confusion   Metformin And Related Diarrhea   Morphine And Related Itching   Olmesartan     Other reaction(s): diarrhea   Olmesartan Medoxomil-Hctz Diarrhea   Social History   Occupational History   Not on file  Tobacco Use   Smoking status: Never   Smokeless tobacco: Never  Vaping Use   Vaping Use: Never used  Substance and Sexual Activity   Alcohol use: Never   Drug use: Never   Sexual activity: Not on file   History reviewed. No pertinent family history. Immunization History  Administered Date(s) Administered   PFIZER(Purple Top)SARS-COV-2 Vaccination 03/11/2019, 04/13/2019     Review of Systems: Negative except as noted in the HPI.   Objective: There were no vitals filed for this visit.  Adrienne Gonzalez is a pleasant 88 y.o. female in NAD. AAO X 3.  Vascular Examination: CFT <3 seconds b/l. DP/PT pulses faintly palpable b/l. Skin temperature gradient warm to warm b/l. No pain with calf compression. No ischemia or gangrene. No cyanosis or clubbing noted b/l. Pedal hair absent.   Neurological Examination: Sensation grossly intact b/l with 10 gram monofilament. Vibratory sensation intact b/l.   Dermatological Examination: Pedal skin warm and supple b/l. Toenails 1-5 b/l thick, discolored, elongated with subungual debris and pain on dorsal palpation.  No open wounds b/l LE. No interdigital macerations noted b/l LE. No hyperkeratotic nor porokeratotic lesions present on today's visit.  Musculoskeletal Examination: Muscle strength 5/5 to b/l LE. HAV with bunion bilaterally and hammertoes 2-5 b/l.  Radiographs: None  Footwear Assessment: Does the patient wear appropriate shoes? Yes. Does the patient need inserts/orthotics? No.  ADA Risk Categorization: Low Risk :  Patient has all of the following: Intact protective sensation No prior foot ulcer  No severe deformity Pedal pulses present  Assessment: 1. Pain due to onychomycosis of toenails  of both feet   2. Hallux valgus, acquired, bilateral   3. Acquired hammertoes of both feet   4. Diabetic nephropathy associated with type 2 diabetes mellitus ([place])   5. Encounter for diabetic foot exam [facility])     Plan: -Patient's family member present. All questions/concerns addressed on today's visit. -Examined patient. -Diabetic foot examination performed today. -Continue foot and shoe inspections daily. Monitor blood glucose per PCP/Endocrinologist's recommendations. -Patient to continue soft, supportive shoe gear daily. -Toenails 1-5 b/l were debrided in length and girth with sterile nail nippers and dremel without iatrogenic bleeding.  -Patient/POA to call should there be question/concern in the interim. Return in about 3 months (around 12/23/2021).  Marzetta Board, DPM

## 2021-09-25 DIAGNOSIS — H35372 Puckering of macula, left eye: Secondary | ICD-10-CM | POA: Diagnosis not present

## 2021-09-25 DIAGNOSIS — E113392 Type 2 diabetes mellitus with moderate nonproliferative diabetic retinopathy without macular edema, left eye: Secondary | ICD-10-CM | POA: Diagnosis not present

## 2021-09-25 DIAGNOSIS — H04123 Dry eye syndrome of bilateral lacrimal glands: Secondary | ICD-10-CM | POA: Diagnosis not present

## 2021-09-25 DIAGNOSIS — H47233 Glaucomatous optic atrophy, bilateral: Secondary | ICD-10-CM | POA: Diagnosis not present

## 2021-09-25 DIAGNOSIS — E1139 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: Secondary | ICD-10-CM | POA: Diagnosis not present

## 2021-09-25 DIAGNOSIS — H401133 Primary open-angle glaucoma, bilateral, severe stage: Secondary | ICD-10-CM | POA: Diagnosis not present

## 2021-09-30 ENCOUNTER — Other Ambulatory Visit: Payer: Medicare PPO | Admitting: Internal Medicine

## 2021-09-30 DIAGNOSIS — F0390 Unspecified dementia without behavioral disturbance: Secondary | ICD-10-CM

## 2021-09-30 DIAGNOSIS — F5101 Primary insomnia: Secondary | ICD-10-CM | POA: Diagnosis not present

## 2021-09-30 DIAGNOSIS — H548 Legal blindness, as defined in USA: Secondary | ICD-10-CM | POA: Diagnosis not present

## 2021-09-30 MED ORDER — TRAZODONE HCL 50 MG PO TABS: 25.0000 mg | ORAL_TABLET | Freq: Every day | ORAL | 3 refills | Status: AC

## 2021-09-30 NOTE — Progress Notes (Signed)
[facility] Consult Note Telephone: [phone]  Fax: [phone]   Date of encounter: 09/30/21 2:30 PM PATIENT NAME: Adrienne Gonzalez [address] [place] [address]   [phone] (home)  DOB: [birth year] MRN: [id number] PRIMARY CARE PROVIDER:    Harlan Stains, MD,  [address] [place] [address] [phone]  REFERRING PROVIDER:   Harlan Stains, MD [place],  [place] [address] [phone]  RESPONSIBLE PARTY:    Contact Information     Name Relation Home Work Mobile   [facility] Daughter [phone]  [phone]       Due to the COVID-19 crisis, this visit was done via telemedicine from my office and it was initiated and consent by this patient and or family.  I connected with  Brigitt Mcclish OR PROXY on 09/30/21 by a telemedicine application and verified that I am speaking with the correct person using two identifiers.  This patient did not have the ability to connect via a video-enabled capacity.   I discussed the limitations of evaluation and management by telemedicine. The patient expressed understanding and agreed to proceed.  Palliative Care was asked to follow this patient by consultation request of  Harlan Stains, MD to address advance care planning and complex medical decision making. This is the initial visit.                                     ASSESSMENT AND PLAN / RECOMMENDATIONS:   Advance Care Planning/Goals of Care: Goals include to maximize quality of life and symptom management. Patient/health care surrogate gave his/her permission to discuss.Our advance care planning conversation included a discussion about:    The value and importance of advance care planning  Experiences with loved ones who have been seriously ill or have died  Exploration of personal, cultural or spiritual beliefs that might influence medical decisions  Exploration of goals of care in  the event of a sudden injury or illness  Identification  of a healthcare agent--Valaree is HCPOA for her mother Review and updating or creation of an  advance directive document . Decision not to resuscitate or to de-escalate disease focused treatments due to poor prognosis. CODE STATUS:  Trial of CPR requested, but not repeated.  No ventilator.   Discussed options for in-home caregivers, WSS, and aide and attendance if possible due to pt's husband's veteran status--did serve active duty--connect with social work.    Symptom Management/Plan: 1. Primary insomnia -opted to try trazodone for rest as appetite not a concern and didn't want to cause daytime lethargy with remeron either - traZODone (DESYREL) 50 MG tablet; Take 0.5 tablets (25 mg total) by mouth at bedtime.  Dispense: 45 tablet; Refill: 3  2. Legal blindness, as defined in [place] -notable with increased needs in home  3. Dementia without behavioral disturbance, psychotic disturbance, mood disturbance, or anxiety, unspecified dementia severity, unspecified dementia type ([place]) Having progressive cognitive decline at advanced age though much of adl dependence is likely related to blindness -connected with social work to [employer] with increased home care and options like [facility]?      This visit was coded based on medical decision making (MDM).  PPS: 40%  HOSPICE ELIGIBILITY/DIAGNOSIS: bordering/dementia  Chief Complaint: initial telephone palliative consult  HISTORY OF PRESENT ILLNESS:  Adrienne Gonzalez is a 88 y.o. year old female  with DMII with neuropathy,  retinopathy and gastroparesis, onychomycosis, legal blindness CKD4, GERD and memory difficulty.     Pt was declared legally blind in July of last year.  Pt lives in [facility].  She started struggling with hand to mouth coordination was off--ie brushing teeth and eating.   Benn Moulder is now helping her with eating and brushes denture.  She helps with feeding, bathing, dressing.   The biggest problem now is that pt is not sleeping at night.  She is taking melatonin 30m at bedtime--not helping so went to 122m  Not working either.  She comes right out of the bedroom to the bathroom.  Pt will wander out of her bedroom if the bathroom door is not closed.  Gets up around midnight, 2am, 4am, 6am.  VaBenn Moulders retired and serves as prAnimal nutritionist Valaree's daughter also lives with them.  Valaree's brother's health declined so he cannot help like he did (lives in [place]    VaBenn Moulderpends time with lord, gets herself ready and then 9/930, she gets pt ready.  Has to give eye drops at least 10 mins apart/preps breakfast b/w.  They eat at 10/1028.  No longer eats cereal--thinks there are bugs in it.  Has waffle and bacon instead and coffee and water.  Does well eating lunch.  She wants something crunchy at dinner daily and it's hard to make her happy.  She had trouble with gastroparesis in 2019-2020.  Weight has been the same give or take a lb or so.    Valaree's daughter is having to physically go back to work now at times and then cannot be with pt.  VaBenn Moulders there majority of time.  She cannot mow the large lawn for 2.5h anymore.  She cannot really do anything for herself.  Her brother kept her for memorial day weekend and [place]  Pt is no longer able to do tasks like she used to due to her sight and hand coordination. Her memory is not as sharp--cannot always remember her grandchildren--has 5 total.  Remembers Valaree's but not her brother's ([place]  Pt also cared for Valaree's kids.  [place]  VaBenn Moulderas asking about aide and attendance program.    Pt taught first grade and later kindergarten.  At night, she hallucinates--she will be sitting on the side of the bed talking to the children--they should be nice and sweet b/c the principal is coming, etc.    She does not  like to leave home anymore.  Is clingy to her daughter now b/c she cannot see.  VaBenn Moulderlso says she will worry if she put her in a facility for respite so she could get away.  Would not tell anyone she had to use the bathroom.    History obtained from review of EMR, discussion with primary team, and interview with family, facility staff/caregiver and/or Ms. Acy.   I reviewed available labs, medications, imaging, studies and related documents from the EMR.  Records reviewed and summarized above.   ROS Review of Systemssee hpi   Wt Readings from Last 500 Encounters:  01/19/18 130 lb 1.1 oz (59 kg)  01/07/18 130 lb (59 kg)    CURRENT PROBLEM LIST:  Patient Active Problem List   Diagnosis Date Noted   [place] blindness, as defined in [place]/15/2022   Unqualified visual loss, both eyes 11/20/2020   Diabetic renal disease ([place]05/10/2020  Gastroesophageal reflux disease 05/15/2020   Gastroparesis 05/15/2020   Hyperlipidemia 05/15/2020   Hyperparathyroidism due to renal insufficiency ([place]) 05/15/2020   Long term (current) use of insulin ([place]) 05/15/2020   Malnutrition of moderate degree ([place]: 60% to less than 75% of standard weight) ([place]) 05/15/2020   Other specified disorders of bone density and structure, other site 05/15/2020   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 ([place]) 55/73/2202   Vitamin D deficiency 05/15/2020   Right epiretinal membrane 07/27/2019   Primary open angle glaucoma of both eyes, severe stage 07/27/2019   Left epiretinal membrane 07/27/2019   Moderate nonproliferative diabetic retinopathy of both eyes ([place]) 07/27/2019   N&V (nausea and vomiting)    FTT (failure to thrive) in adult    Hypokalemia    CKD (chronic kidney disease), stage III (HCC)    Chronic renal insufficiency, stage 1 54/27/0623   Metabolic acidosis 76/28/3151   Hypoglycemia 01/08/2018   Diarrhea 01/08/2018   AKI (acute kidney injury) ([place]) 01/08/2018    Diabetes mellitus without complication ([place])    Hypertension    PAST MEDICAL HISTORY:  Active Ambulatory Problems    Diagnosis Date Noted   Hypoglycemia 01/08/2018   Diabetes mellitus without complication ([place])    Hypertension    Diarrhea 01/08/2018   AKI (acute kidney injury) ([place]) 76/16/0737   Metabolic acidosis 10/62/6948   Chronic renal insufficiency, stage 1 01/10/2018   FTT (failure to thrive) in adult    Hypokalemia    CKD (chronic kidney disease), stage III (HCC)    N&V (nausea and vomiting)    Right epiretinal membrane 07/27/2019   Primary open angle glaucoma of both eyes, severe stage 07/27/2019   Left epiretinal membrane 07/27/2019   Moderate nonproliferative diabetic retinopathy of both eyes ([place]) 07/27/2019   Diabetic renal disease ([place]) 05/15/2020   Gastroesophageal reflux disease 05/15/2020   Gastroparesis 05/15/2020   Hyperlipidemia 05/15/2020   Hyperparathyroidism due to renal insufficiency ([place]) 05/15/2020   Long term (current) use of insulin ([place]) 05/15/2020   Malnutrition of moderate degree ([place]: 60% to less than 75% of standard weight) ([place]) 05/15/2020   Other specified disorders of bone density and structure, other site 05/15/2020   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 ([place]) 54/62/7035   Vitamin D deficiency 05/15/2020   Legal blindness, as defined in [place] 11/20/2020   Unqualified visual loss, both eyes 11/20/2020   Resolved Ambulatory Problems    Diagnosis Date Noted   No Resolved Ambulatory Problems   No Additional Past Medical History   SOCIAL HX:  Social History   Tobacco Use   Smoking status: Never   Smokeless tobacco: Never  Substance Use Topics   Alcohol use: Never   FAMILY HX: No family history on file.    ALLERGIES:  Allergies  Allergen Reactions   Benzocaine Other (See Comments)    confusion   Metformin And Related Diarrhea   Morphine And Related Itching   Olmesartan     Other reaction(s):  diarrhea   Olmesartan Medoxomil-Hctz Diarrhea      PERTINENT MEDICATIONS:  Outpatient Encounter Medications as of 09/30/2021  Medication Sig   ACCU-CHEK GUIDE test strip    Accu-Chek Softclix Lancets lancets    ALPHAGAN P 0.1 % SOLN Apply 1 drop to eye 3 (three) times daily.   amLODipine (NORVASC) 5 MG tablet Take 1 tablet (5 mg total) by mouth daily.   aspirin 81 MG EC tablet Take 81 mg by mouth daily.  Swallow whole.   B-D ULTRAFINE III SHORT PEN 31G X 8 MM MISC    Blood Glucose Monitoring Suppl (ACCU-CHEK GUIDE) w/Device KIT    Brimonidine Tartrate (ALPHAGAN P OP) Apply to eye.   cholecalciferol (VITAMIN D3) 25 MCG (1000 UT) tablet Take 1,000 Units by mouth daily.   colestipol (COLESTID) 1 g tablet Take 1 tablet (1 g total) by mouth 2 (two) times daily.   dorzolamide-timolol (COSOPT) 22.3-6.8 MG/ML ophthalmic solution Place 1 drop into both eyes 2 (two) times daily.   Insulin Aspart Prot & Aspart (NOVOLOG MIX 70/30 [place]) Inject into the skin.   LUMIGAN 0.01 % SOLN SMARTSIG:1 Drop(s) In Eye(s) Every Evening   pravastatin (PRAVACHOL) 40 MG tablet Take 40 mg by mouth daily.   RHOPRESSA 0.02 % SOLN Apply to eye.   sucralfate (CARAFATE) 1 g tablet Take 1 g by mouth 2 (two) times daily.   VYZULTA 0.024 % SOLN    No facility-administered encounter medications on file as of 09/30/2021.    Thank you for the opportunity to participate in the care of Ms. Falero.  The palliative care team will continue to follow. Please call our office at [phone] if we can be of additional assistance.   Hollace Kinnier, DO  COVID-19 PATIENT SCREENING TOOL Asked and negative response unless otherwise noted:  Have you had symptoms of covid, tested positive or been in contact with someone with symptoms/positive test in the past 5-10 days?  NO

## 2021-12-03 DIAGNOSIS — R112 Nausea with vomiting, unspecified: Secondary | ICD-10-CM | POA: Diagnosis not present

## 2021-12-20 ENCOUNTER — Ambulatory Visit: Payer: Medicare PPO | Admitting: Podiatry

## 2021-12-20 ENCOUNTER — Encounter: Payer: Self-pay | Admitting: Podiatry

## 2021-12-20 VITALS — BP 139/64

## 2021-12-20 DIAGNOSIS — E119 Type 2 diabetes mellitus without complications: Secondary | ICD-10-CM

## 2021-12-20 DIAGNOSIS — M79674 Pain in right toe(s): Secondary | ICD-10-CM | POA: Diagnosis not present

## 2021-12-20 DIAGNOSIS — B351 Tinea unguium: Secondary | ICD-10-CM

## 2021-12-20 DIAGNOSIS — E1121 Type 2 diabetes mellitus with diabetic nephropathy: Secondary | ICD-10-CM | POA: Diagnosis not present

## 2021-12-20 DIAGNOSIS — M79675 Pain in left toe(s): Secondary | ICD-10-CM | POA: Diagnosis not present

## 2021-12-20 NOTE — Progress Notes (Unsigned)
  Subjective:  Patient ID: Adrienne Gonzalez, female    DOB: 12/30/1925,  MRN: [id number]  Adrienne Gonzalez presents to clinic today for {jgcomplaint:23593}  Chief Complaint  Patient presents with   Nail Problem    [facility] BS-111 A1C-7.3 PCP-White, Aram Beecham PCP VST-Fall 2023   New problem(s): None. {jgcomplaint:23593}  PCP is Laurann Montana, MD.  Allergies  Allergen Reactions   Benzocaine Other (See Comments)    confusion   Metformin And Related Diarrhea   Morphine And Related Itching   Olmesartan     Other reaction(s): diarrhea   Olmesartan Medoxomil-Hctz Diarrhea    Review of Systems: Negative except as noted in the HPI.  Objective: No changes noted in today's physical examination. There were no vitals filed for this visit.  Adrienne Gonzalez is a pleasant 88 y.o. female {jgbodyhabitus:24098} AAO x 3.  Vascular Examination: CFT <3 seconds b/l. DP/PT pulses faintly palpable b/l. Skin temperature gradient warm to warm b/l. No pain with calf compression. No ischemia or gangrene. No cyanosis or clubbing noted b/l. Pedal hair absent.   Neurological Examination: Sensation grossly intact b/l with 10 gram monofilament. Vibratory sensation intact b/l.   Dermatological Examination: Pedal skin warm and supple b/l. Toenails 1-5 b/l thick, discolored, elongated with subungual debris and pain on dorsal palpation.  No open wounds b/l LE. No interdigital macerations noted b/l LE. No hyperkeratotic nor porokeratotic lesions present on today's visit.  Musculoskeletal Examination: Muscle strength 5/5 to b/l LE. HAV with bunion bilaterally and hammertoes 2-5 b/l.  Radiographs: None  Assessment/Plan: 1. Pain due to onychomycosis of toenails of both feet   2. Controlled type 2 diabetes mellitus without complication, without long-term current use of insulin (HCC)     No orders of the defined types were placed in this encounter.   None {Jgplan:23602::"-Patient/POA to call should there be question/concern in  the interim."}   Return in about 3 months (around 03/21/2022).  [name], DPM

## 2021-12-24 ENCOUNTER — Ambulatory Visit: Payer: Medicare PPO | Admitting: Podiatry

## 2022-01-13 DIAGNOSIS — E785 Hyperlipidemia, unspecified: Secondary | ICD-10-CM | POA: Diagnosis not present

## 2022-01-13 DIAGNOSIS — Z794 Long term (current) use of insulin: Secondary | ICD-10-CM | POA: Diagnosis not present

## 2022-01-13 DIAGNOSIS — E1121 Type 2 diabetes mellitus with diabetic nephropathy: Secondary | ICD-10-CM | POA: Diagnosis not present

## 2022-01-13 DIAGNOSIS — N1832 Chronic kidney disease, stage 3b: Secondary | ICD-10-CM | POA: Diagnosis not present

## 2022-01-13 DIAGNOSIS — I129 Hypertensive chronic kidney disease with stage 1 through stage 4 chronic kidney disease, or unspecified chronic kidney disease: Secondary | ICD-10-CM | POA: Diagnosis not present

## 2022-01-13 DIAGNOSIS — G479 Sleep disorder, unspecified: Secondary | ICD-10-CM | POA: Diagnosis not present

## 2022-01-13 DIAGNOSIS — R413 Other amnesia: Secondary | ICD-10-CM | POA: Diagnosis not present

## 2022-01-13 DIAGNOSIS — H548 Legal blindness, as defined in USA: Secondary | ICD-10-CM | POA: Diagnosis not present

## 2022-03-27 DIAGNOSIS — H401133 Primary open-angle glaucoma, bilateral, severe stage: Secondary | ICD-10-CM | POA: Diagnosis not present

## 2022-03-27 DIAGNOSIS — H47233 Glaucomatous optic atrophy, bilateral: Secondary | ICD-10-CM | POA: Diagnosis not present

## 2022-03-27 DIAGNOSIS — H35372 Puckering of macula, left eye: Secondary | ICD-10-CM | POA: Diagnosis not present

## 2022-03-27 DIAGNOSIS — H04123 Dry eye syndrome of bilateral lacrimal glands: Secondary | ICD-10-CM | POA: Diagnosis not present

## 2022-03-27 DIAGNOSIS — E113392 Type 2 diabetes mellitus with moderate nonproliferative diabetic retinopathy without macular edema, left eye: Secondary | ICD-10-CM | POA: Diagnosis not present

## 2022-03-27 DIAGNOSIS — E1139 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: Secondary | ICD-10-CM | POA: Diagnosis not present

## 2022-04-14 ENCOUNTER — Ambulatory Visit: Payer: Medicare PPO | Admitting: Podiatry

## 2022-04-14 ENCOUNTER — Encounter: Payer: Self-pay | Admitting: Podiatry

## 2022-04-14 DIAGNOSIS — M79675 Pain in left toe(s): Secondary | ICD-10-CM

## 2022-04-14 DIAGNOSIS — M79674 Pain in right toe(s): Secondary | ICD-10-CM | POA: Diagnosis not present

## 2022-04-14 DIAGNOSIS — B351 Tinea unguium: Secondary | ICD-10-CM | POA: Diagnosis not present

## 2022-04-14 DIAGNOSIS — E1121 Type 2 diabetes mellitus with diabetic nephropathy: Secondary | ICD-10-CM

## 2022-04-14 NOTE — Progress Notes (Unsigned)
  Subjective:  Patient ID: Adrienne Gonzalez, female    DOB: [birth year],  MRN: [id number]  Adrienne Gonzalez presents to clinic today for {jgcomplaint:23593}  Chief Complaint  Patient presents with   Diabetes    [facility] BS - 131 A1C - 7.4 LVPCP - 11/2021   New problem(s): None. {jgcomplaint:23593}  PCP is Laurann Montana, MD.  Allergies  Allergen Reactions   Benzocaine Other (See Comments)    confusion   Metformin And Related Diarrhea   Morphine And Related Itching   Olmesartan     Other reaction(s): diarrhea   Olmesartan Medoxomil-Hctz Diarrhea    Review of Systems: Negative except as noted in the HPI.  Objective:  There were no vitals filed for this visit. Adrienne Gonzalez is a pleasant 89 y.o. female {jgbodyhabitus:24098} AAO x 3.  Vascular Examination: CFT <3 seconds b/l. DP/PT pulses faintly palpable b/l. Skin temperature gradient warm to warm b/l. No pain with calf compression. No ischemia or gangrene. No cyanosis or clubbing noted b/l. Pedal hair absent.   Neurological Examination: Sensation grossly intact b/l with 10 gram monofilament. Vibratory sensation intact b/l.   Dermatological Examination: Pedal skin warm and supple b/l. Toenails 1-5 b/l thick, discolored, elongated with subungual debris and pain on dorsal palpation.  No open wounds b/l LE. No interdigital macerations noted b/l LE. No hyperkeratotic nor porokeratotic lesions present on today's visit.  Musculoskeletal Examination: Muscle strength 5/5 to b/l LE. HAV with bunion bilaterally and hammertoes 2-5 b/l.  Radiographs: None  Assessment/Plan: 1. Pain due to onychomycosis of toenails of both feet   2. Diabetic nephropathy associated with type 2 diabetes mellitus     No orders of the defined types were placed in this encounter.   None {Jgplan:23602::"-Patient/POA to call should there be question/concern in the interim."}   Return in about 3 months (around 07/14/2022).  [name], DPM

## 2022-05-08 DIAGNOSIS — H35372 Puckering of macula, left eye: Secondary | ICD-10-CM | POA: Diagnosis not present

## 2022-05-08 DIAGNOSIS — E113392 Type 2 diabetes mellitus with moderate nonproliferative diabetic retinopathy without macular edema, left eye: Secondary | ICD-10-CM | POA: Diagnosis not present

## 2022-05-08 DIAGNOSIS — H401133 Primary open-angle glaucoma, bilateral, severe stage: Secondary | ICD-10-CM | POA: Diagnosis not present

## 2022-05-08 DIAGNOSIS — H04123 Dry eye syndrome of bilateral lacrimal glands: Secondary | ICD-10-CM | POA: Diagnosis not present

## 2022-05-08 DIAGNOSIS — H47233 Glaucomatous optic atrophy, bilateral: Secondary | ICD-10-CM | POA: Diagnosis not present

## 2022-05-08 DIAGNOSIS — E1139 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: Secondary | ICD-10-CM | POA: Diagnosis not present

## 2022-05-19 DIAGNOSIS — N1832 Chronic kidney disease, stage 3b: Secondary | ICD-10-CM | POA: Diagnosis not present

## 2022-05-19 DIAGNOSIS — R413 Other amnesia: Secondary | ICD-10-CM | POA: Diagnosis not present

## 2022-05-19 DIAGNOSIS — I129 Hypertensive chronic kidney disease with stage 1 through stage 4 chronic kidney disease, or unspecified chronic kidney disease: Secondary | ICD-10-CM | POA: Diagnosis not present

## 2022-05-19 DIAGNOSIS — E1121 Type 2 diabetes mellitus with diabetic nephropathy: Secondary | ICD-10-CM | POA: Diagnosis not present

## 2022-05-19 DIAGNOSIS — H548 Legal blindness, as defined in USA: Secondary | ICD-10-CM | POA: Diagnosis not present

## 2022-05-19 DIAGNOSIS — E785 Hyperlipidemia, unspecified: Secondary | ICD-10-CM | POA: Diagnosis not present

## 2022-05-19 DIAGNOSIS — Z794 Long term (current) use of insulin: Secondary | ICD-10-CM | POA: Diagnosis not present

## 2022-08-20 DIAGNOSIS — K219 Gastro-esophageal reflux disease without esophagitis: Secondary | ICD-10-CM | POA: Diagnosis not present

## 2022-08-20 DIAGNOSIS — H409 Unspecified glaucoma: Secondary | ICD-10-CM | POA: Diagnosis not present

## 2022-08-20 DIAGNOSIS — Z794 Long term (current) use of insulin: Secondary | ICD-10-CM | POA: Diagnosis not present

## 2022-08-20 DIAGNOSIS — E1143 Type 2 diabetes mellitus with diabetic autonomic (poly)neuropathy: Secondary | ICD-10-CM | POA: Diagnosis not present

## 2022-08-20 DIAGNOSIS — I129 Hypertensive chronic kidney disease with stage 1 through stage 4 chronic kidney disease, or unspecified chronic kidney disease: Secondary | ICD-10-CM | POA: Diagnosis not present

## 2022-08-20 DIAGNOSIS — E785 Hyperlipidemia, unspecified: Secondary | ICD-10-CM | POA: Diagnosis not present

## 2022-08-20 DIAGNOSIS — F0390 Unspecified dementia without behavioral disturbance: Secondary | ICD-10-CM | POA: Diagnosis not present

## 2022-08-20 DIAGNOSIS — E1122 Type 2 diabetes mellitus with diabetic chronic kidney disease: Secondary | ICD-10-CM | POA: Diagnosis not present

## 2022-08-20 DIAGNOSIS — N189 Chronic kidney disease, unspecified: Secondary | ICD-10-CM | POA: Diagnosis not present

## 2022-08-26 ENCOUNTER — Ambulatory Visit: Payer: Medicare PPO | Admitting: Podiatry

## 2022-08-26 DIAGNOSIS — M79675 Pain in left toe(s): Secondary | ICD-10-CM | POA: Diagnosis not present

## 2022-08-26 DIAGNOSIS — E1121 Type 2 diabetes mellitus with diabetic nephropathy: Secondary | ICD-10-CM

## 2022-08-26 DIAGNOSIS — M79674 Pain in right toe(s): Secondary | ICD-10-CM | POA: Diagnosis not present

## 2022-08-26 DIAGNOSIS — B351 Tinea unguium: Secondary | ICD-10-CM | POA: Diagnosis not present

## 2022-08-31 ENCOUNTER — Encounter: Payer: Self-pay | Admitting: Podiatry

## 2022-08-31 NOTE — Progress Notes (Signed)
  Subjective:  Patient ID: Adrienne Gonzalez, female    DOB: 1925-06-21,  MRN: [id number]  Adrienne Gonzalez presents to clinic today for at risk foot care. Pt has h/o NIDDM with chronic kidney disease and painful thick toenails that are difficult to trim. Pain interferes with ambulation. Aggravating factors include wearing enclosed shoe gear. Pain is relieved with periodic professional debridement.  Chief Complaint  Patient presents with   Diabetes    [facility] BS - 120 A1C - 7.3 LVPCP - 04/2022   New problem(s): None.   PCP is Laurann Montana, MD.  Allergies  Allergen Reactions   Benzocaine Other (See Comments)    confusion   Metformin And Related Diarrhea   Morphine And Codeine Itching   Olmesartan     Other reaction(s): diarrhea   Olmesartan Medoxomil-Hctz Diarrhea    Review of Systems: Negative except as noted in the HPI.  Objective: No changes noted in today's physical examination. There were no vitals filed for this visit. Adrienne Gonzalez is a pleasant 89 y.o. female WD, WN in NAD. AAO x 3.  Vascular Examination: CFT <3 seconds b/l. DP/PT pulses faintly palpable b/l. Skin temperature gradient warm to warm b/l. No pain with calf compression. No ischemia or gangrene. No cyanosis or clubbing noted b/l. Pedal hair absent.   Neurological Examination: Sensation grossly intact b/l with 10 gram monofilament. Vibratory sensation intact b/l.   Dermatological Examination: Pedal skin warm and supple b/l. Toenails 1-5 b/l thick, discolored, elongated with subungual debris and pain on dorsal palpation.  No open wounds b/l LE. No interdigital macerations noted b/l LE. No hyperkeratotic nor porokeratotic lesions present on today's visit.  Musculoskeletal Examination: Muscle strength 5/5 to b/l LE. HAV with bunion bilaterally and hammertoes 2-5 b/l.  Radiographs: None  Assessment/Plan: 1. Pain due to onychomycosis of toenails of both feet   2. Diabetic nephropathy associated with type 2 diabetes mellitus  (HCC)    Patient was evaluated and treated. All patient's and/or POA's questions/concerns addressed on today's visit. Mycotic toenails 1-5 debrided in length and girth without incident. Continue soft, supportive shoe gear daily. Report any pedal injuries to medical professional. Call office if there are any quesitons/concerns. -Patient/POA to call should there be question/concern in the interim.   Return in about 3 months (around 11/26/2022).  [name], DPM

## 2022-09-10 DIAGNOSIS — E1122 Type 2 diabetes mellitus with diabetic chronic kidney disease: Secondary | ICD-10-CM | POA: Diagnosis not present

## 2022-09-10 DIAGNOSIS — H548 Legal blindness, as defined in USA: Secondary | ICD-10-CM | POA: Diagnosis not present

## 2022-09-10 DIAGNOSIS — E1121 Type 2 diabetes mellitus with diabetic nephropathy: Secondary | ICD-10-CM | POA: Diagnosis not present

## 2022-09-10 DIAGNOSIS — Z23 Encounter for immunization: Secondary | ICD-10-CM | POA: Diagnosis not present

## 2022-09-10 DIAGNOSIS — H6121 Impacted cerumen, right ear: Secondary | ICD-10-CM | POA: Diagnosis not present

## 2022-09-10 DIAGNOSIS — R413 Other amnesia: Secondary | ICD-10-CM | POA: Diagnosis not present

## 2022-09-10 DIAGNOSIS — I129 Hypertensive chronic kidney disease with stage 1 through stage 4 chronic kidney disease, or unspecified chronic kidney disease: Secondary | ICD-10-CM | POA: Diagnosis not present

## 2022-09-10 DIAGNOSIS — E785 Hyperlipidemia, unspecified: Secondary | ICD-10-CM | POA: Diagnosis not present

## 2022-09-10 DIAGNOSIS — N1832 Chronic kidney disease, stage 3b: Secondary | ICD-10-CM | POA: Diagnosis not present

## 2022-09-10 DIAGNOSIS — Z794 Long term (current) use of insulin: Secondary | ICD-10-CM | POA: Diagnosis not present

## 2022-09-11 DIAGNOSIS — H401133 Primary open-angle glaucoma, bilateral, severe stage: Secondary | ICD-10-CM | POA: Diagnosis not present

## 2022-09-11 DIAGNOSIS — H35372 Puckering of macula, left eye: Secondary | ICD-10-CM | POA: Diagnosis not present

## 2022-09-11 DIAGNOSIS — H04123 Dry eye syndrome of bilateral lacrimal glands: Secondary | ICD-10-CM | POA: Diagnosis not present

## 2022-09-11 DIAGNOSIS — E1139 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: Secondary | ICD-10-CM | POA: Diagnosis not present

## 2022-09-25 DIAGNOSIS — E1121 Type 2 diabetes mellitus with diabetic nephropathy: Secondary | ICD-10-CM | POA: Diagnosis not present

## 2022-09-25 DIAGNOSIS — D631 Anemia in chronic kidney disease: Secondary | ICD-10-CM | POA: Diagnosis not present

## 2022-09-25 DIAGNOSIS — N1832 Chronic kidney disease, stage 3b: Secondary | ICD-10-CM | POA: Diagnosis not present

## 2022-09-25 DIAGNOSIS — N2581 Secondary hyperparathyroidism of renal origin: Secondary | ICD-10-CM | POA: Diagnosis not present

## 2022-09-25 DIAGNOSIS — I129 Hypertensive chronic kidney disease with stage 1 through stage 4 chronic kidney disease, or unspecified chronic kidney disease: Secondary | ICD-10-CM | POA: Diagnosis not present

## 2022-10-30 DIAGNOSIS — K219 Gastro-esophageal reflux disease without esophagitis: Secondary | ICD-10-CM | POA: Diagnosis not present

## 2022-11-26 ENCOUNTER — Encounter: Payer: Self-pay | Admitting: Podiatry

## 2022-11-26 ENCOUNTER — Ambulatory Visit (INDEPENDENT_AMBULATORY_CARE_PROVIDER_SITE_OTHER): Payer: Medicare PPO | Admitting: Podiatry

## 2022-11-26 DIAGNOSIS — M79674 Pain in right toe(s): Secondary | ICD-10-CM | POA: Diagnosis not present

## 2022-11-26 DIAGNOSIS — B351 Tinea unguium: Secondary | ICD-10-CM | POA: Diagnosis not present

## 2022-11-26 DIAGNOSIS — E1121 Type 2 diabetes mellitus with diabetic nephropathy: Secondary | ICD-10-CM

## 2022-11-26 DIAGNOSIS — M79675 Pain in left toe(s): Secondary | ICD-10-CM

## 2022-11-26 NOTE — Progress Notes (Signed)
  Subjective:  Patient ID: Adrienne Gonzalez, female    DOB: 08-01-1925,   MRN: [id number]  No chief complaint on file.   89 y.o. female presents for concern of thickened elongated and painful nails that are difficult to trim. Requesting to have them trimmed today. Relates burning and tingling in their feet. Patient is diabetic and last A1c was  Lab Results  Component Value Date   HGBA1C 6.4 (H) 01/10/2018   .   PCP:  Laurann Montana, MD    . Denies any other pedal complaints. Denies n/v/f/c.   Past Medical History:  Diagnosis Date   Chronic renal insufficiency, stage 1    Diabetes mellitus without complication (HCC)    Hypertension     Objective:  Physical Exam: Vascular: DP/PT pulses 2/4 bilateral. CFT <3 seconds. Absent hair growth on digits. Edema noted to bilateral lower extremities. Xerosis noted bilaterally.  Skin. No lacerations or abrasions bilateral feet. Nails 1-5 bilateral  are thickened discolored and elongated with subungual debris.  Musculoskeletal: MMT 5/5 bilateral lower extremities in DF, PF, Inversion and Eversion. Deceased ROM in DF of ankle joint.  Neurological: Sensation intact to light touch. Protective sensation diminished bilateral.  .   Assessment:   1. Pain due to onychomycosis of toenails of both feet   2. Diabetic nephropathy associated with type 2 diabetes mellitus (HCC)      Plan:  Patient was evaluated and treated and all questions answered. -Discussed and educated patient on diabetic foot care, especially with  regards to the vascular, neurological and musculoskeletal systems.  -Stressed the importance of good glycemic control and the detriment of not  controlling glucose levels in relation to the foot. -Discussed supportive shoes at all times and checking feet regularly.  -Mechanically debrided all nails 1-5 bilateral using sterile nail nipper and filed with dremel without incident  -Answered all patient questions -Patient to return  in 3 months  for at risk foot care -Patient advised to call the office if any problems or questions arise in the meantime.   Louann Sjogren, DPM

## 2023-01-21 DIAGNOSIS — R413 Other amnesia: Secondary | ICD-10-CM | POA: Diagnosis not present

## 2023-01-21 DIAGNOSIS — E785 Hyperlipidemia, unspecified: Secondary | ICD-10-CM | POA: Diagnosis not present

## 2023-01-21 DIAGNOSIS — Z23 Encounter for immunization: Secondary | ICD-10-CM | POA: Diagnosis not present

## 2023-01-21 DIAGNOSIS — I129 Hypertensive chronic kidney disease with stage 1 through stage 4 chronic kidney disease, or unspecified chronic kidney disease: Secondary | ICD-10-CM | POA: Diagnosis not present

## 2023-01-21 DIAGNOSIS — E1122 Type 2 diabetes mellitus with diabetic chronic kidney disease: Secondary | ICD-10-CM | POA: Diagnosis not present

## 2023-01-21 DIAGNOSIS — E1121 Type 2 diabetes mellitus with diabetic nephropathy: Secondary | ICD-10-CM | POA: Diagnosis not present

## 2023-01-21 DIAGNOSIS — Z794 Long term (current) use of insulin: Secondary | ICD-10-CM | POA: Diagnosis not present

## 2023-01-21 DIAGNOSIS — H548 Legal blindness, as defined in USA: Secondary | ICD-10-CM | POA: Diagnosis not present

## 2023-01-21 DIAGNOSIS — N1832 Chronic kidney disease, stage 3b: Secondary | ICD-10-CM | POA: Diagnosis not present

## 2023-01-22 DIAGNOSIS — H401133 Primary open-angle glaucoma, bilateral, severe stage: Secondary | ICD-10-CM | POA: Diagnosis not present

## 2023-01-22 DIAGNOSIS — E1139 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: Secondary | ICD-10-CM | POA: Diagnosis not present

## 2023-01-22 DIAGNOSIS — H04123 Dry eye syndrome of bilateral lacrimal glands: Secondary | ICD-10-CM | POA: Diagnosis not present

## 2023-01-22 DIAGNOSIS — H35372 Puckering of macula, left eye: Secondary | ICD-10-CM | POA: Diagnosis not present

## 2023-03-03 ENCOUNTER — Encounter: Payer: Self-pay | Admitting: Podiatry

## 2023-03-03 ENCOUNTER — Ambulatory Visit: Payer: Medicare PPO | Admitting: Podiatry

## 2023-03-03 VITALS — Ht 60.0 in | Wt 130.7 lb

## 2023-03-03 DIAGNOSIS — M2012 Hallux valgus (acquired), left foot: Secondary | ICD-10-CM | POA: Diagnosis not present

## 2023-03-03 DIAGNOSIS — M2041 Other hammer toe(s) (acquired), right foot: Secondary | ICD-10-CM

## 2023-03-03 DIAGNOSIS — B351 Tinea unguium: Secondary | ICD-10-CM | POA: Diagnosis not present

## 2023-03-03 DIAGNOSIS — M2042 Other hammer toe(s) (acquired), left foot: Secondary | ICD-10-CM | POA: Diagnosis not present

## 2023-03-03 DIAGNOSIS — E119 Type 2 diabetes mellitus without complications: Secondary | ICD-10-CM | POA: Diagnosis not present

## 2023-03-03 DIAGNOSIS — M79674 Pain in right toe(s): Secondary | ICD-10-CM

## 2023-03-03 DIAGNOSIS — M2011 Hallux valgus (acquired), right foot: Secondary | ICD-10-CM | POA: Diagnosis not present

## 2023-03-03 DIAGNOSIS — E1121 Type 2 diabetes mellitus with diabetic nephropathy: Secondary | ICD-10-CM | POA: Diagnosis not present

## 2023-03-03 DIAGNOSIS — M79675 Pain in left toe(s): Secondary | ICD-10-CM | POA: Diagnosis not present

## 2023-03-06 NOTE — Progress Notes (Signed)
 ANNUAL DIABETIC FOOT EXAM  Subjective: Adrienne Gonzalez presents today for annual diabetic foot exam.  Chief Complaint  Patient presents with   Nail Problem    Pt is here for [facility] last A1C was 8 PCP is Dr Cliffton Asters and LOV was in September.   Patient confirms h/o diabetes.  Patient denies any h/o foot wounds.  Laurann Montana, MD is patient's PCP.  Past Medical History:  Diagnosis Date   Chronic renal insufficiency, stage 1    Diabetes mellitus without complication (HCC)    Hypertension    Patient Active Problem List   Diagnosis Date Noted   Legal blindness, as defined in [place] 11/20/2020   Unqualified visual loss, both eyes 11/20/2020   Diabetic renal disease (HCC) 05/15/2020   Gastroesophageal reflux disease 05/15/2020   Gastroparesis 05/15/2020   Hyperlipidemia 05/15/2020   Hyperparathyroidism due to renal insufficiency (HCC) 05/15/2020   Long term (current) use of insulin (HCC) 05/15/2020   Malnutrition of moderate degree (Gomez: 60% to less than 75% of standard weight) (HCC) 05/15/2020   Other specified disorders of bone density and structure, other site 05/15/2020   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) 05/15/2020   Vitamin D deficiency 05/15/2020   Right epiretinal membrane 07/27/2019   Primary open angle glaucoma of both eyes, severe stage 07/27/2019   Left epiretinal membrane 07/27/2019   Moderate nonproliferative diabetic retinopathy of both eyes (HCC) 07/27/2019   N&V (nausea and vomiting)    FTT (failure to thrive) in adult    Hypokalemia    CKD (chronic kidney disease), stage III (HCC)    Chronic renal insufficiency, stage 1 01/10/2018   Metabolic acidosis 01/09/2018   Hypoglycemia 01/08/2018   Diarrhea 01/08/2018   AKI (acute kidney injury) (HCC) 01/08/2018   Diabetes mellitus without complication (HCC)    Hypertension    Past Surgical History:  Procedure Laterality Date   BIOPSY  01/19/2018   Procedure: BIOPSY;  Surgeon:  Kathi Der, MD;  Location: [facility];  Service: Gastroenterology;;   FLEXIBLE SIGMOIDOSCOPY N/A 01/19/2018   Procedure: FLEXIBLE SIGMOIDOSCOPY;  Surgeon: Kathi Der, MD;  Location: [facility];  Service: Gastroenterology;  Laterality: N/A;   Current Outpatient Medications on File Prior to Visit  Medication Sig Dispense Refill   ACCU-CHEK GUIDE test strip      Accu-Chek Softclix Lancets lancets      ALPHAGAN P 0.1 % SOLN Apply 1 drop to eye 3 (three) times daily.     amLODipine (NORVASC) 5 MG tablet Take 1 tablet (5 mg total) by mouth daily. 30 tablet 0   aspirin 81 MG EC tablet Take 81 mg by mouth daily. Swallow whole.     B-D ULTRAFINE III SHORT PEN 31G X 8 MM MISC      Blood Glucose Monitoring Suppl (ACCU-CHEK GUIDE) w/Device KIT      Brimonidine Tartrate (ALPHAGAN P OP) Apply to eye.     cholecalciferol (VITAMIN D3) 25 MCG (1000 UT) tablet Take 1,000 Units by mouth daily.     colestipol (COLESTID) 1 g tablet Take 1 tablet (1 g total) by mouth 2 (two) times daily. 30 tablet 0   dorzolamide-timolol (COSOPT) 22.3-6.8 MG/ML ophthalmic solution Place 1 drop into both eyes 2 (two) times daily.     Insulin Aspart Prot & Aspart (NOVOLOG MIX 70/30 [place]) Inject into the skin.     LUMIGAN 0.01 % SOLN SMARTSIG:1 Drop(s) In Eye(s) Every Evening     pravastatin (PRAVACHOL) 40 MG tablet  Take 40 mg by mouth daily.     RHOPRESSA 0.02 % SOLN Apply to eye.     sucralfate (CARAFATE) 1 g tablet Take 1 g by mouth 2 (two) times daily.     traZODone (DESYREL) 50 MG tablet Take 0.5 tablets (25 mg total) by mouth at bedtime. 45 tablet 3   VYZULTA 0.024 % SOLN      No current facility-administered medications on file prior to visit.    Allergies  Allergen Reactions   Benzocaine Other (See Comments)    confusion   Metformin And Related Diarrhea   Morphine And Codeine Itching   Olmesartan     Other reaction(s): diarrhea   Olmesartan Medoxomil-Hctz Diarrhea   Social History   Occupational  History   Not on file  Tobacco Use   Smoking status: Never   Smokeless tobacco: Never  Vaping Use   Vaping status: Never Used  Substance and Sexual Activity   Alcohol use: Never   Drug use: Never   Sexual activity: Not on file   History reviewed. No pertinent family history. Immunization History  Administered Date(s) Administered   PFIZER(Purple Top)SARS-COV-2 Vaccination 03/11/2019, 04/13/2019     Review of Systems: Negative except as noted in the HPI.   Objective: There were no vitals filed for this visit.  Adrienne Gonzalez is a pleasant 89 y.o. female in NAD. AAO X 3.  Diabetic foot exam was performed with the following findings:   Vascular Examination: CFT <3 seconds b/l. DP/PT pulses faintly palpable b/l. Skin temperature gradient warm to warm b/l. No pain with calf compression. No ischemia or gangrene. No cyanosis or clubbing noted b/l.    Neurological Examination: Sensation grossly intact b/l with 10 gram monofilament. Vibratory sensation intact b/l.   Dermatological Examination: Pedal skin warm and supple b/l.   No open wounds. No interdigital macerations.  Toenails 1-5 b/l thick, discolored, elongated with subungual debris and pain on dorsal palpation.    No corns, calluses nor porokeratotic lesions noted.  Musculoskeletal Examination: Muscle strength 5/5 to all lower extremity muscle groups bilaterally. HAV with bunion deformity noted b/l LE. Hammertoe deformity noted 2-5 b/l.  Radiographs: None     Lab Results  Component Value Date   HGBA1C 6.4 (H) 01/10/2018   ADA Risk Categorization: Low Risk :  Patient has all of the following: Intact protective sensation No prior foot ulcer  No severe deformity Pedal pulses present  Assessment: 1. Pain due to onychomycosis of toenails of both feet   2. Hallux valgus, acquired, bilateral   3. Acquired hammertoes of both feet   4. Diabetic nephropathy associated with type 2 diabetes mellitus (HCC)   5. Encounter  for diabetic foot exam (HCC)     Plan: Diabetic foot examination performed today. All patient's and/or POA's questions/concerns addressed on today's visit. Toenails 1-5 debrided in length and girth without incident. Continue foot and shoe inspections daily. Monitor blood glucose per PCP/Endocrinologist's recommendations. Continue soft, supportive shoe gear daily. Report any pedal injuries to medical professional. Call office if there are any questions/concerns. -Patient/POA to call should there be question/concern in the interim. Return in about 4 months (around 07/01/2023).  [name], DPM      [place] LOCATION: [address]  [place], [place] [address]                   Office [phone]   [facility] LOCATION: [address] [place], [place] [address] Office [phone]

## 2023-04-01 DIAGNOSIS — Z794 Long term (current) use of insulin: Secondary | ICD-10-CM | POA: Diagnosis not present

## 2023-04-01 DIAGNOSIS — E785 Hyperlipidemia, unspecified: Secondary | ICD-10-CM | POA: Diagnosis not present

## 2023-04-01 DIAGNOSIS — E1122 Type 2 diabetes mellitus with diabetic chronic kidney disease: Secondary | ICD-10-CM | POA: Diagnosis not present

## 2023-04-01 DIAGNOSIS — E11319 Type 2 diabetes mellitus with unspecified diabetic retinopathy without macular edema: Secondary | ICD-10-CM | POA: Diagnosis not present

## 2023-04-01 DIAGNOSIS — E1142 Type 2 diabetes mellitus with diabetic polyneuropathy: Secondary | ICD-10-CM | POA: Diagnosis not present

## 2023-04-01 DIAGNOSIS — F03A0 Unspecified dementia, mild, without behavioral disturbance, psychotic disturbance, mood disturbance, and anxiety: Secondary | ICD-10-CM | POA: Diagnosis not present

## 2023-04-01 DIAGNOSIS — I129 Hypertensive chronic kidney disease with stage 1 through stage 4 chronic kidney disease, or unspecified chronic kidney disease: Secondary | ICD-10-CM | POA: Diagnosis not present

## 2023-04-01 DIAGNOSIS — K219 Gastro-esophageal reflux disease without esophagitis: Secondary | ICD-10-CM | POA: Diagnosis not present

## 2023-04-01 DIAGNOSIS — Z7982 Long term (current) use of aspirin: Secondary | ICD-10-CM | POA: Diagnosis not present

## 2023-05-21 DIAGNOSIS — H548 Legal blindness, as defined in USA: Secondary | ICD-10-CM | POA: Diagnosis not present

## 2023-05-21 DIAGNOSIS — N1832 Chronic kidney disease, stage 3b: Secondary | ICD-10-CM | POA: Diagnosis not present

## 2023-05-21 DIAGNOSIS — R413 Other amnesia: Secondary | ICD-10-CM | POA: Diagnosis not present

## 2023-05-21 DIAGNOSIS — Z794 Long term (current) use of insulin: Secondary | ICD-10-CM | POA: Diagnosis not present

## 2023-05-21 DIAGNOSIS — E1121 Type 2 diabetes mellitus with diabetic nephropathy: Secondary | ICD-10-CM | POA: Diagnosis not present

## 2023-05-21 DIAGNOSIS — E785 Hyperlipidemia, unspecified: Secondary | ICD-10-CM | POA: Diagnosis not present

## 2023-05-21 DIAGNOSIS — E559 Vitamin D deficiency, unspecified: Secondary | ICD-10-CM | POA: Diagnosis not present

## 2023-05-21 DIAGNOSIS — I129 Hypertensive chronic kidney disease with stage 1 through stage 4 chronic kidney disease, or unspecified chronic kidney disease: Secondary | ICD-10-CM | POA: Diagnosis not present

## 2023-05-27 ENCOUNTER — Ambulatory Visit: Payer: Medicare PPO | Admitting: Podiatry

## 2023-06-30 DIAGNOSIS — H04123 Dry eye syndrome of bilateral lacrimal glands: Secondary | ICD-10-CM | POA: Diagnosis not present

## 2023-06-30 DIAGNOSIS — H401133 Primary open-angle glaucoma, bilateral, severe stage: Secondary | ICD-10-CM | POA: Diagnosis not present

## 2023-06-30 DIAGNOSIS — E1139 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: Secondary | ICD-10-CM | POA: Diagnosis not present

## 2023-06-30 DIAGNOSIS — H35372 Puckering of macula, left eye: Secondary | ICD-10-CM | POA: Diagnosis not present

## 2023-07-01 ENCOUNTER — Ambulatory Visit: Payer: Medicare PPO | Admitting: Podiatry

## 2023-07-01 ENCOUNTER — Other Ambulatory Visit: Payer: Medicare PPO

## 2023-07-01 ENCOUNTER — Encounter: Payer: Self-pay | Admitting: Podiatry

## 2023-07-01 DIAGNOSIS — B351 Tinea unguium: Secondary | ICD-10-CM | POA: Diagnosis not present

## 2023-07-01 DIAGNOSIS — M79674 Pain in right toe(s): Secondary | ICD-10-CM

## 2023-07-01 DIAGNOSIS — E1121 Type 2 diabetes mellitus with diabetic nephropathy: Secondary | ICD-10-CM

## 2023-07-01 DIAGNOSIS — M79675 Pain in left toe(s): Secondary | ICD-10-CM

## 2023-07-05 NOTE — Progress Notes (Signed)
  Subjective:  Patient ID: Adrienne Gonzalez, female    DOB: [birth year],  MRN: [id number]  89 y.o. female presents with at risk foot care. Pt has h/o NIDDM with chronic kidney disease and painful thick toenails that are difficult to trim. Pain interferes with ambulation. Aggravating factors include wearing enclosed shoe gear. Pain is relieved with periodic professional debridement.   PCP: Teresa Channel, MD.  New problem(s): None.   Review of Systems: Negative except as noted in the HPI.   Allergies  Allergen Reactions   Benzocaine Other (See Comments)    confusion   Metformin And Related Diarrhea   Morphine And Codeine Itching   Olmesartan     Other reaction(s): diarrhea   Olmesartan Medoxomil-Hctz Diarrhea    Objective:  There were no vitals filed for this visit. Constitutional Patient is a pleasant 89 y.o. female in NAD. AAO x 3.  Vascular Capillary fill time to digits <3 seconds.  DP/PT pulse(s) are faintly palpable b/l lower extremities. Pedal hair absent b/l. Lower extremity skin temperature gradient warm to cool b/l. No pain with calf compression b/l. No cyanosis or clubbing noted. No ischemia nor gangrene noted b/l.   Neurologic Protective sensation intact 5/5 intact bilaterally with 10g monofilament b/l. Vibratory sensation intact b/l. No clonus b/l.   Dermatologic Pedal skin is thin, shiny and atrophic b/l.  No open wounds b/l lower extremities. No interdigital macerations b/l lower extremities. Toenails 1-5 b/l elongated, discolored, dystrophic, thickened, crumbly with subungual debris and tenderness to dorsal palpation. No hyperkeratotic nor porokeratotic lesions present on today's visit.  Orthopedic: Normal muscle strength 5/5 to all lower extremity muscle groups bilaterally. HAV with bunion bilaterally and hammertoes 2-5 b/l.   Last HgA1c:      No data to display           Assessment:   1. Pain due to onychomycosis of toenails of both feet   2. Diabetic nephropathy  associated with type 2 diabetes mellitus (HCC)    Plan:  Patient was evaluated and treated. All patient's and/or POA's questions/concerns addressed on today's visit. Mycotic toenails 1-5 debrided in length and girth without incident.  Continue daily foot inspections and monitor blood glucose per PCP/Endocrinologist's recommendations.Continue soft, supportive shoe gear daily. Report any pedal injuries to medical professional. Call office if there are any quesitons/concerns. -Patient/POA to call should there be question/concern in the interim.  Return in about 3 months (around 10/01/2023).  Delon LITTIE Merlin, DPM      [place] LOCATION: [address] [place] [address]                   Office [phone]   [facility] LOCATION: [address] [place], [place] [address] Office [phone]

## 2023-07-14 ENCOUNTER — Telehealth: Payer: Self-pay

## 2023-07-14 DIAGNOSIS — H548 Legal blindness, as defined in USA: Secondary | ICD-10-CM

## 2023-07-14 DIAGNOSIS — E1139 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: Secondary | ICD-10-CM

## 2023-08-04 ENCOUNTER — Telehealth: Payer: Self-pay | Admitting: *Deleted

## 2023-08-04 NOTE — Progress Notes (Signed)
 Complex Care Management Note  Care Guide Note 08/04/2023 Name: Adrienne Gonzalez MRN: [id number] DOB: 1925/01/09  [name] is a 89 y.o. year old female who sees Teresa Channel, MD for primary care. I reached out to [facility] by phone today to offer complex care management services.  [name] was given information about Complex Care Management services today including:   The Complex Care Management services include support from the care team which includes your Nurse Care Manager, Clinical Social Worker, or Pharmacist.  The Complex Care Management team is here to help remove barriers to the health concerns and goals most important to you. Complex Care Management services are voluntary, and the patient may decline or stop services at any time by request to their care team member.   Complex Care Management Consent Status: Patient agreed to services and verbal consent obtained.   Follow up plan:  Telephone appointment with complex care management team member scheduled for:  [facility] 8/13 and 8/1 BSW   Encounter Outcome:  Patient Scheduled

## 2023-08-04 NOTE — Progress Notes (Signed)
 Care Guide Pharmacy Note  08/04/2023 Name: Adrienne Gonzalez MRN: [id number] DOB: 09/14/1925  Referred By: Teresa Channel, MD Reason for referral: Complex Care Management (Initial outreach to schedule referral with RNCM )   Adrienne Gonzalez is a 89 y.o. year old female who is a primary care patient of Teresa Channel, MD.  Adrienne Gonzalez was referred to the pharmacist for assistance related to: HTN and DMII  Successful contact was made with the patient to discuss pharmacy services.  Patient declines engagement at this time. Contact information was provided to the patient should they wish to reach out for assistance at a later time.  Adrienne Gonzalez  [facility]  [facility], [facility] Guide  Direct Dial: [phone]  Fax [phone]

## 2023-08-07 ENCOUNTER — Other Ambulatory Visit: Payer: Self-pay | Admitting: Licensed Clinical Social Worker

## 2023-08-07 NOTE — Patient Instructions (Signed)
 Visit Information  Thank you for taking time to visit with me today. Please don't hesitate to contact me if I can be of assistance to you before our next scheduled appointment.  Our next appointment is no further scheduled appointments.  Please call the care guide team at [phone] if you need to cancel or reschedule your appointment.   Following is a copy of your care plan:   Goals Addressed   None     Please call the Suicide and Crisis Lifeline: 988 go to [facility] [address], [place] [phone]) call 911 if you are experiencing a Mental Health or Behavioral Health Crisis or need someone to talk to.  The patient verbalized understanding of instructions, educational materials, and care plan provided today and DECLINED offer to receive copy of patient instructions, educational materials, and care plan.   Tobias CHARM Maranda HEDWIG, PhD [facility], [place] [facility] Social Worker Direct Dial: [phone]  Fax: [phone]

## 2023-08-07 NOTE — Patient Outreach (Signed)
 Complex Care Management   Visit Note  08/07/2023  Name:  Adrienne Gonzalez MRN: [id number] DOB: 1925-11-24  Situation: Referral received for Complex Care Management related to SDOH Barriers:  initial screening  I obtained verbal consent from Caregiver Patient.  Visit completed with daughter Adrienne Gonzalez (daughter)  on the phone  Background:   Past Medical History:  Diagnosis Date   Chronic renal insufficiency, stage 1    Diabetes mellitus without complication (HCC)    Hypertension     Assessment:Sw went over the SDOH and there were no needs at this time, Patient will have a call with the [facility] on 08/19/2023 at 2:15 pm      Recommendation:   none  Follow Up Plan:   Closing From:  Complex Care Management Adrienne Gonzalez Adrienne Gonzalez Adrienne HEDWIG, PhD Adrienne Gonzalez, Adrienne Gonzalez Social Worker Direct Dial: [phone]  Fax: [phone]

## 2023-08-19 ENCOUNTER — Other Ambulatory Visit: Payer: Self-pay

## 2023-08-19 DIAGNOSIS — E113393 Type 2 diabetes mellitus with moderate nonproliferative diabetic retinopathy without macular edema, bilateral: Secondary | ICD-10-CM

## 2023-08-19 NOTE — Patient Instructions (Signed)
 Visit Information  Thank you for taking time to visit with me today. Please don't hesitate to contact me if I can be of assistance to you before our next scheduled appointment.  Our next appointment is by telephone on Friday 09/25/23 at 2:00pm  Please call the care guide team at [phone] if you need to cancel or reschedule your appointment.   Following is a copy of your care plan:   Goals Addressed             This Visit's Progress    [name] RN Care Plan       Problems:  Chronic Disease Management support and education needs related to Diabetic Retinopathy of both eyes.   Goal: Over the next 45 days the Caregiver will work with pharmacist to address Financial constraints related to cost of Alphagan  P related to Diabetic Retinopathy as evidenced by review of electronic medical record and patient or pharmacist report     Interventions:   Evaluation of current treatment plan related to Diabetic Retinopathy, cost of medication self-management and patient's adherence to plan as established by provider. Discussed plans with patient for ongoing care management follow up and provided patient with direct contact information for care management team Pharmacy referral for cost savings for Alphagan  P  Patient Self-Care Activities:  Work with the pharmacist to address medication management needs and will continue to work with the clinical team to address health care and disease management related needs  Plan:  Telephone follow up 09/25/2023 at 2:00pm per caregiver preference.              A reminder to ALL patients/family/friends,  please call the [place]  National Suicide Prevention Lifeline: 775-428-9631 or TTY: 204-216-8252 TTY (864) 211-5862) to talk to a trained counselor if you are experiencing a Mental Health or Behavioral Health Crisis or need someone to talk to.  The patient verbalized understanding of instructions, educational materials, and care plan provided today and DECLINED  offer to receive copy of patient instructions, educational materials, and care plan.   Santana Stamp BSN, CCM [place]  [name] Population Health RN Care Manager Direct Dial: [phone]  Fax: [phone]

## 2023-08-19 NOTE — Patient Outreach (Addendum)
 Complex Care Management   Visit Note  08/19/2023  Name:  Adrienne Gonzalez MRN: [id number] DOB: 23-Aug-1925  Situation: Referral received for Complex Care Management related to Legal Blindness, DM2 with ophthalmic complications. I obtained verbal consent from Caregiver.  Visit completed with daughter, Adrienne Gonzalez   on the phone.  Daughter would like information for cost savings for Alphagan  P due to $100 cost for 50-day supply.  Ophthalmologist has already prescribed generic med for Alphagan  P and it didn't work to decrease eye pressure so started back on Alphagan  P.   Background:   Past Medical History:  Diagnosis Date   Chronic renal insufficiency, stage 1    Diabetes mellitus without complication (HCC)    Hypertension     Assessment: Patient Reported Symptoms:  Cognitive Cognitive Status: Requires Assistance Decision Making, Struggling with memory recall, Able to follow simple commands      Neurological Neurological Review of Symptoms: No symptoms reported    HEENT HEENT Symptoms Reported: Other: HEENT Comment: patient is legally blind,  goes to Opthalmologist as ordered, daughter instills eye drops as ordered.    Cardiovascular Cardiovascular Symptoms Reported: No symptoms reported    Respiratory Respiratory Symptoms Reported: No symptoms reported    Endocrine Endocrine Symptoms Reported: No symptoms reported Is patient diabetic?: Yes Is patient checking blood sugars at home?: Yes List most recent blood sugar readings, include date and time of day: These are all before breakfast: 8/13 143mg /dL, 1/87 133mg /dL, 1/88 157mg /dL , 1/89 151mg /dL  1/90 124mg /dL, 8/8 128mg /dL. Endocrine Comment: Uses Freestyle Libre 2. Has backup glucometer if needed.  Gastrointestinal Gastrointestinal Symptoms Reported: No symptoms reported      Genitourinary Genitourinary Symptoms Reported: No symptoms reported Genitourinary Self-Management Outcome: 4 (good) Genitourinary Comment: Daughter has her on a  bladder schedule.  Integumentary Integumentary Symptoms Reported: No symptoms reported Additional Integumentary Details: See Podigary every 3 months, no foot issues at this time.    Musculoskeletal Musculoskelatal Symptoms Reviewed: Unsteady gait Musculoskeletal Comment: Will not use cane or a walker per patient, mostly walks with one-person assist. Patient has a bed monitor and baby monitor for supervision. Falls in the past year?: No Patient at Risk for Falls Due to: History of fall(s) Fall risk Follow up: Falls evaluation completed  Psychosocial Psychosocial Symptoms Reported: No symptoms reported Additional Psychological Details: Daughter, Olean, is full-time caregiver they live in the same home together.  Patient's granddaughter works from the home and is able to provide assistance as well.   Patient's son is able to provide caregiver relief for [place] so she can enjoy an upcoming vacation. Behavioral Health Self-Management Outcome: 5 (very good)          08/19/2023    3:41 PM  Depression screen PHQ 2/9  Decreased Interest 0  Down, Depressed, Hopeless 0  PHQ - 2 Score 0    There were no vitals filed for this visit.  Medications Reviewed Today   Medications were not reviewed in this encounter     Recommendation:   PCP follow up  scheduled 09/24/23 Specialty provider follow-up : Nephrology 09/22/23 Referral to: Pharmacy for Alphagan  P cost savings   Follow Up Plan:   Telephone follow-up 09/25/23 at 2:00pm per cargiver preference.   Santana Stamp BSN, CCM [place]  [name] Population Health RN Care Manager Direct Dial: [phone]  Fax: [phone]

## 2023-08-20 ENCOUNTER — Telehealth: Payer: Self-pay | Admitting: Pharmacist

## 2023-08-20 NOTE — Progress Notes (Signed)
   08/20/2023  Patient ID: Adrienne Gonzalez, female   DOB: [birth year], 89 y.o.   MRN: [id number]  Received referral message from [name] nurse regarding Alphagan  P cost.   Patient is taking Alphagan  P three times a day as ordered by Ophthalmology, pays $100 for 50 day supply, inquiring if there is patient assistance program for this med or a less expensive pharmacy.  Ophthalmologist has already tried the generic form but it didn't help to decrease pressure so he put her back on the Alphagan  P.  Daughter, Adrienne Gonzalez, is full-time caregiver/DPR/manages meds and should be contacted due to patient has memory loss.  Thank you!!   Confirmed cost online and the $100 for 5mL bottle is accurate. Tried to determine other medication combinations, but eye doctor has already optimized all current options.  Called daughter Adrienne Gonzalez. Requested me to call back at 11:30AM. Brief discussion regarding household income. 3 of them currently. 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 Can complete [employer] PAP for Alphagan  P and bring to ophthalmologist to sign form and fax out. They will then notify us  of additional information needed. Will get mailed to home. Should qualify based on personal income alone.   Called patient's daughter back. Going to proceed with the application. Application completed. Will be placed at front desk of [facility] on Tuesday. Advised she will need to pick it up and take to Dr. Cyrus (eye doctor) to be signed and faxed. Confirmed understanding and thanked for assistance.     Adrienne Gonzalez, PharmD [facility]  Phone Number: [phone]

## 2023-08-27 ENCOUNTER — Telehealth: Payer: Self-pay | Admitting: Pharmacist

## 2023-08-27 NOTE — Progress Notes (Signed)
   08/27/2023  Patient ID: Adrienne Gonzalez, female   DOB: July 09, 1925, 89 y.o.   MRN: [id number]  Call return requested from daughter based on application picked up at front desk.  Returned call to patient's daughter Olean. Reports she cannot conscientiously submit the application with the patient's information only. Said that she did claim her on taxes and does not believe she will qualify. Advised it is worth trying to see what the electronic verification system returns stating. Declined submission.   Advised only other options are to shop around with insurances for the next year to determine the eye drop coverage. OR talk with Dr. Cyrus about potential alternatives. 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 Advised if she needs to pick something else, there are always Medicare Advantage plans as well. Thanked for the assistance and will continue to pay for the eye drops at this time.    Aloysius Lewis, PharmD [facility]  Phone Number: [phone]

## 2023-09-17 DIAGNOSIS — N1832 Chronic kidney disease, stage 3b: Secondary | ICD-10-CM | POA: Diagnosis not present

## 2023-09-18 DIAGNOSIS — N1832 Chronic kidney disease, stage 3b: Secondary | ICD-10-CM | POA: Diagnosis not present

## 2023-09-22 DIAGNOSIS — I129 Hypertensive chronic kidney disease with stage 1 through stage 4 chronic kidney disease, or unspecified chronic kidney disease: Secondary | ICD-10-CM | POA: Diagnosis not present

## 2023-09-22 DIAGNOSIS — D631 Anemia in chronic kidney disease: Secondary | ICD-10-CM | POA: Diagnosis not present

## 2023-09-22 DIAGNOSIS — N2581 Secondary hyperparathyroidism of renal origin: Secondary | ICD-10-CM | POA: Diagnosis not present

## 2023-09-22 DIAGNOSIS — N1832 Chronic kidney disease, stage 3b: Secondary | ICD-10-CM | POA: Diagnosis not present

## 2023-09-24 DIAGNOSIS — N184 Chronic kidney disease, stage 4 (severe): Secondary | ICD-10-CM | POA: Diagnosis not present

## 2023-09-24 DIAGNOSIS — R413 Other amnesia: Secondary | ICD-10-CM | POA: Diagnosis not present

## 2023-09-24 DIAGNOSIS — E1121 Type 2 diabetes mellitus with diabetic nephropathy: Secondary | ICD-10-CM | POA: Diagnosis not present

## 2023-09-24 DIAGNOSIS — E785 Hyperlipidemia, unspecified: Secondary | ICD-10-CM | POA: Diagnosis not present

## 2023-09-24 DIAGNOSIS — H548 Legal blindness, as defined in USA: Secondary | ICD-10-CM | POA: Diagnosis not present

## 2023-09-24 DIAGNOSIS — I129 Hypertensive chronic kidney disease with stage 1 through stage 4 chronic kidney disease, or unspecified chronic kidney disease: Secondary | ICD-10-CM | POA: Diagnosis not present

## 2023-09-24 DIAGNOSIS — Z794 Long term (current) use of insulin: Secondary | ICD-10-CM | POA: Diagnosis not present

## 2023-09-24 DIAGNOSIS — Z23 Encounter for immunization: Secondary | ICD-10-CM | POA: Diagnosis not present

## 2023-09-25 ENCOUNTER — Other Ambulatory Visit: Payer: Self-pay

## 2023-09-25 NOTE — Patient Outreach (Signed)
 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 I obtained verbal consent from Caregiver.  Visit completed with [name], daughter,  on the phone. Denies patient concerns/caregiver concerns today.  Daughter has decided to not move forward on completing [employer] application for Alphagan  eye drops due to patient won't qualify with three incomes in the home.      Background:   Past Medical History:  Diagnosis Date   Chronic renal insufficiency, stage 1    Diabetes mellitus without complication (HCC)    Hypertension     Assessment: Patient Reported Symptoms:  Cognitive Cognitive Status: Struggling with memory recall, Able to follow simple commands, Requires Assistance Decision Making      Neurological Neurological Review of Symptoms: No symptoms reported    HEENT   HEENT Comment: Patient is legally blind, continues to go to Ophthalmologist as ordered, daughter instills eye drops as ordered.    Cardiovascular Cardiovascular Symptoms Reported: Not assessed    Respiratory Respiratory Symptoms Reported: No symptoms reported    Endocrine Endocrine Symptoms Reported: No symptoms reported (A1C 7.8% 09/25/23 , improved from 9.1% 05/25/23) Is patient diabetic?: Yes Is patient checking blood sugars at home?: Yes    Gastrointestinal Gastrointestinal Symptoms Reported: No symptoms reported      Genitourinary Genitourinary Symptoms Reported: No symptoms reported    Integumentary Integumentary Symptoms Reported: Not assessed    Musculoskeletal Musculoskelatal Symptoms Reviewed: Unsteady gait Musculoskeletal Comment: Will not use cane or a walker per patient, mostly walks with one-person assist. Patient has a bed monitor and baby monitor for supervision.      Psychosocial Psychosocial Symptoms Reported: Not assessed          09/25/2023    PHQ2-9  Depression Screening   Little interest or pleasure in doing things    Feeling down, depressed, or hopeless    PHQ-2 - Total Score    Trouble falling or staying asleep, or sleeping too much    Feeling tired or having little energy    Poor appetite or overeating     Feeling bad about yourself - or that you are a failure or have let yourself or your family down    Trouble concentrating on things, such as reading the newspaper or watching television    Moving or speaking so slowly that other people could have noticed.  Or the opposite - being so fidgety or restless that you have been moving around a lot more than usual    Thoughts that you would be better off dead, or hurting yourself in some way    PHQ2-9 Total Score    If you checked off any problems, how difficult have these problems made it for you to do your work, take care of things at home, or get along with other people    Depression Interventions/Treatment      There were no vitals filed for this visit.  Medications Reviewed Today   Medications were not reviewed in this encounter     Recommendation:   Specialty provider follow-up :Podiatry 10/14/23 Continue Current Plan of Care Caregivers will encourage more water  intake due to kidney function-denies any other recommendation from Nephrologist (Dr. Tobie)  Caregiver will call this RNCM if she needs caregiver resources or has questions   Follow Up Plan:   Telephone follow-up two months.  Lysette Lindenbaum Recruitment consultant, CCM [place]  [name] Population  Health RN Care Manager Direct Dial: [phone]  Fax: [phone]

## 2023-09-25 NOTE — Patient Instructions (Signed)
 Visit Information  Thank you for taking time to visit with me today. Please don't hesitate to contact me if I can be of assistance to you before our next scheduled appointment.  Your next care management appointment is by telephone on Monday, November 17th at 2:00pm.   Please call the care guide team at [phone] if you need to cancel, schedule, or reschedule an appointment.   A reminder to ALL patients/family/friends, please call the [place]  National Suicide Prevention Lifeline: (416)689-8066 or TTY: 2084332645 TTY 865-573-7091) to talk to a trained counselor if you are experiencing a Mental Health or Behavioral Health Crisis or need someone to talk to.  Declines receiving after visit summary in the mail.   Santana Stamp BSN, CCM [place]  [name] Population Health RN Care Manager Direct Dial: [phone]  Fax: [phone]

## 2023-10-05 DIAGNOSIS — N1832 Chronic kidney disease, stage 3b: Secondary | ICD-10-CM | POA: Diagnosis not present

## 2023-10-14 ENCOUNTER — Ambulatory Visit: Admitting: Podiatry

## 2023-10-14 ENCOUNTER — Encounter: Payer: Self-pay | Admitting: Podiatry

## 2023-10-14 DIAGNOSIS — M79674 Pain in right toe(s): Secondary | ICD-10-CM | POA: Diagnosis not present

## 2023-10-14 DIAGNOSIS — M79675 Pain in left toe(s): Secondary | ICD-10-CM

## 2023-10-14 DIAGNOSIS — E1121 Type 2 diabetes mellitus with diabetic nephropathy: Secondary | ICD-10-CM | POA: Diagnosis not present

## 2023-10-14 DIAGNOSIS — B351 Tinea unguium: Secondary | ICD-10-CM | POA: Diagnosis not present

## 2023-10-20 NOTE — Progress Notes (Signed)
  Subjective:  Patient ID: Adrienne Gonzalez, female    DOB: November 23, 1925,  MRN: [id number]  Adrienne Gonzalez presents to clinic today for at risk foot care. Pt has h/o NIDDM with chronic kidney disease and painful mycotic toenails x 10 which interfere with daily activities. Pain is relieved with periodic professional debridement.  Chief Complaint  Patient presents with   Diabetes    DFC A1C 6.4 IDDM.Toenail trim. LOV with PCP 05/21/23.   New problem(s): None.   PCP is Teresa Channel, MD.  Allergies  Allergen Reactions   Benzocaine Other (See Comments)    confusion   Metformin And Related Diarrhea   Morphine And Codeine Itching   Olmesartan     Other reaction(s): diarrhea   Olmesartan Medoxomil-Hctz Diarrhea    Review of Systems: Negative except as noted in the HPI.  Objective: No changes noted in today's physical examination. There were no vitals filed for this visit. Adrienne Gonzalez is a pleasant 89 y.o. female WD, WN in NAD. AAO x 3.  Vascular Examination: CFT <3 seconds b/l. DP/PT pulses faintly palpable b/l. Pedal edema absent. Skin temperature gradient warm to warm b/l. Digital hair absent. No pain with calf compression. No ischemia or gangrene. No cyanosis or clubbing noted b/l.    Neurological Examination: Sensation grossly intact b/l with 10 gram monofilament. Vibratory sensation intact b/l.   Dermatological Examination: Pedal skin warm and supple b/l.   No open wounds. No interdigital macerations.  Toenails 1-5 b/l thick, discolored, elongated with subungual debris and pain on dorsal palpation.    No corns, calluses, nor porokeratotic lesions.  Musculoskeletal Examination: Muscle strength 5/5 to all lower extremity muscle groups bilaterally. HAV with bunion deformity noted b/l LE. Hammertoe deformity noted 2-5 b/l.  Radiographs: None  Assessment/Plan: 1. Pain due to onychomycosis of toenails of both feet   2. Diabetic nephropathy associated with type 2 diabetes mellitus [facility])    Consent given for treatment. Patient examined. All patient's and/or POA's questions/concerns addressed on today's visit. Toenails 1-5 debrided in length and girth without incident. Continue foot and shoe inspections daily. Monitor blood glucose per PCP/Endocrinologist's recommendations. Continue soft, supportive shoe gear daily. Report any pedal injuries to medical professional. Call office if there are any questions/concerns. -Patient/POA to call should there be question/concern in the interim.   Return in about 3 months (around 01/14/2024).  Delon LITTIE Merlin, DPM      [facility] LOCATION: [address] [place] [address]                   Office [phone]   [facility] LOCATION: [address] [place], [place] [address] Office [phone]

## 2023-11-23 ENCOUNTER — Telehealth: Payer: Self-pay

## 2023-11-23 NOTE — Patient Outreach (Signed)
 Care Coordination   11/23/2023 Name: Adrienne Gonzalez MRN: [id number] DOB: 03-20-1925   Care Coordination Outreach Attempts:  An unsuccessful outreach was attempted for an appointment today.  Follow Up Plan:  Additional outreach attempts will be made to complete follow-up visit.   Encounter Outcome:  No Answer. HIPAA compliant voicemail left requesting return call.   Rosaline Finlay, RN MSN [place]  [name] Population Health RN Care Manager Direct Dial: [phone]  Fax: [phone]

## 2023-11-24 ENCOUNTER — Telehealth: Payer: Self-pay

## 2023-11-25 ENCOUNTER — Telehealth: Payer: Self-pay

## 2023-11-25 NOTE — Patient Instructions (Signed)
 Visit Information  Thank you for taking time to visit with me today. Please don't hesitate to contact me if I can be of assistance to you before our next scheduled appointment.  Your next care management appointment is no further scheduled appointments.    Patient has met all care management goals. Care Management case will be closed. Patient has been provided contact information should new needs arise.   Please call the care guide team at [phone] if you need to cancel, schedule, or reschedule an appointment.   A reminder to ALL patients/family/friends, please call the [place]  National Suicide Prevention Lifeline: 5396368936 or TTY: 629-210-0878 TTY [phone]) to talk to a trained counselor if you are experiencing a Mental Health or Behavioral Health Crisis or need someone to talk to.  Santana Stamp BSN, CCM [place]  [name] Population Health RN Care Manager Direct Dial: [phone]  Fax: [phone]
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
## 2023-12-06 ENCOUNTER — Emergency Department (HOSPITAL_COMMUNITY)

## 2023-12-06 ENCOUNTER — Emergency Department (HOSPITAL_COMMUNITY)
Admission: EM | Admit: 2023-12-06 | Discharge: 2023-12-06 | Disposition: A | Discharge status: Home / Self Care | Attending: Emergency Medicine | Admitting: Emergency Medicine

## 2023-12-06 DIAGNOSIS — E1122 Type 2 diabetes mellitus with diabetic chronic kidney disease: Secondary | ICD-10-CM | POA: Diagnosis not present

## 2023-12-06 DIAGNOSIS — N183 Chronic kidney disease, stage 3 unspecified: Secondary | ICD-10-CM | POA: Diagnosis not present

## 2023-12-06 DIAGNOSIS — Z7982 Long term (current) use of aspirin: Secondary | ICD-10-CM | POA: Insufficient documentation

## 2023-12-06 DIAGNOSIS — R220 Localized swelling, mass and lump, head: Secondary | ICD-10-CM | POA: Diagnosis present

## 2023-12-06 DIAGNOSIS — S0003XA Contusion of scalp, initial encounter: Secondary | ICD-10-CM | POA: Insufficient documentation

## 2023-12-06 DIAGNOSIS — W1830XA Fall on same level, unspecified, initial encounter: Secondary | ICD-10-CM

## 2023-12-06 DIAGNOSIS — I10 Essential (primary) hypertension: Secondary | ICD-10-CM | POA: Diagnosis not present

## 2023-12-06 DIAGNOSIS — W19XXXA Unspecified fall, initial encounter: Secondary | ICD-10-CM | POA: Diagnosis not present

## 2023-12-06 DIAGNOSIS — W010XXA Fall on same level from slipping, tripping and stumbling without subsequent striking against object, initial encounter: Secondary | ICD-10-CM | POA: Insufficient documentation

## 2023-12-06 DIAGNOSIS — M47816 Spondylosis without myelopathy or radiculopathy, lumbar region: Secondary | ICD-10-CM | POA: Diagnosis not present

## 2023-12-06 DIAGNOSIS — R609 Edema, unspecified: Secondary | ICD-10-CM | POA: Diagnosis not present

## 2023-12-06 DIAGNOSIS — Z043 Encounter for examination and observation following other accident: Secondary | ICD-10-CM | POA: Diagnosis not present

## 2023-12-06 LAB — BASIC METABOLIC PANEL WITH GFR
Anion gap: 10 (ref 5–15)
BUN: 25 mg/dL — ABNORMAL HIGH (ref 8–23)
CO2: 22 mmol/L (ref 22–32)
Calcium: 9.5 mg/dL (ref 8.9–10.3)
Chloride: 110 mmol/L (ref 98–111)
Creatinine, Ser: 1.35 mg/dL — ABNORMAL HIGH (ref 0.44–1.00)
GFR, Estimated: 36 mL/min — ABNORMAL LOW (ref >60)
Glucose, Bld: 123 mg/dL — ABNORMAL HIGH (ref 70–99)
Potassium: 4 mmol/L (ref 3.5–5.1)
Sodium: 142 mmol/L (ref 135–145)

## 2023-12-06 LAB — CBC WITH DIFFERENTIAL/PLATELET
Abs Immature Granulocytes: 0.01 K/uL (ref 0.00–0.07)
Basophils Absolute: 0 K/uL (ref 0.0–0.1)
Basophils Relative: 1 %
Eosinophils Absolute: 0.3 K/uL (ref 0.0–0.5)
Eosinophils Relative: 5 %
HCT: 37.7 % (ref 36.0–46.0)
Hemoglobin: 12.1 g/dL (ref 12.0–15.0)
Immature Granulocytes: 0 %
Lymphocytes Relative: 24 %
Lymphs Abs: 1.3 K/uL (ref 0.7–4.0)
MCH: 30.9 pg (ref 26.0–34.0)
MCHC: 32.1 g/dL (ref 30.0–36.0)
MCV: 96.2 fL (ref 80.0–100.0)
Monocytes Absolute: 0.5 K/uL (ref 0.1–1.0)
Monocytes Relative: 9 %
Neutro Abs: 3.2 K/uL (ref 1.7–7.7)
Neutrophils Relative %: 61 %
Platelets: 245 K/uL (ref 150–400)
RBC: 3.92 MIL/uL (ref 3.87–5.11)
RDW: 13.9 % (ref 11.5–15.5)
WBC: 5.3 K/uL (ref 4.0–10.5)
nRBC: 0 % (ref 0.0–0.2)

## 2023-12-06 LAB — CBG MONITORING, ED: Glucose-Capillary: 120 mg/dL — ABNORMAL HIGH (ref 70–99)

## 2023-12-06 MED ORDER — LACTATED RINGERS IV BOLUS
500.0000 mL | Freq: Once | INTRAVENOUS | Status: AC
  Administered 2023-12-06: 500 mL via INTRAVENOUS

## 2023-12-06 MED ORDER — LACTATED RINGERS IV BOLUS: 1000.0000 mL | Freq: Once | INTRAVENOUS | Status: DC

## 2023-12-06 NOTE — ED Triage Notes (Signed)
 Patient BIB EMS from home due to fall. Patient slipped up and fell on her way to the bathroom this am. Hit her head and has knot to left side of head. Patient has Dementia but is alert and oriented to person, place and time. Patient denies pain. Denies any LOC. No blood thinners.       158/76 70 16 98% RA CBG 122 T 99

## 2023-12-06 NOTE — ED Notes (Signed)
 Pt assisted to the [facility] and her C Collar was removed per MD order. Pt had no further complaints of neck or back pain and ambulated with assistance, as she does at baseline.

## 2023-12-06 NOTE — ED Provider Notes (Signed)
 [place] EMERGENCY DEPARTMENT AT [facility] Provider Note   CSN: [id number] Arrival date & time: 12/06/23  9260     History No chief complaint on file.   HPI: Adrienne Gonzalez is a 89 y.o. female with history pertinent for dementia, blindness, chronic renal insufficiency, CKD 3, diabetes, hypoglycemia, HLD, who presents complaining of fall. Patient arrived via EMS from home.  History provided by patient and EMS.  No interpreter required during this encounter.  EMS reports that the patient lives at home with her daughter.  Reportedly the patient was ambulating to the bathroom this morning when she had a trip and fall without loss of consciousness.  The daughter heard the patient fall, and evaluated her and called EMS.  Patient denies any headache, neck pain, back pain, chest pain, abdominal pain.  No complaints at this time.  Patient's recorded medical, surgical, social, medication list and allergies were reviewed in the Snapshot window as part of the initial history.   Prior to Admission medications   Medication Sig Start Date End Date Taking? Authorizing Provider  ACCU-CHEK GUIDE test strip  02/10/19   [provider]  Accu-Chek Softclix Lancets lancets  02/11/19   [provider]  ALPHAGAN  P 0.1 % SOLN Apply 1 drop to eye 3 (three) times daily. 05/03/21   [provider]  amLODipine  (NORVASC ) 5 MG tablet Take 1 tablet (5 mg total) by mouth daily. 01/26/18   Regalado, Belkys A, MD  aspirin  81 MG EC tablet Take 81 mg by mouth daily. Swallow whole.    [provider]  B-D ULTRAFINE III SHORT PEN 31G X 8 MM MISC  08/15/19   [provider]  Blood Glucose Monitoring Suppl (ACCU-CHEK GUIDE) w/Device KIT  02/10/19   [provider]  Brimonidine  Tartrate (ALPHAGAN  P OP) Apply to eye. Twice a day.    [provider]  carboxymethylcellulose (REFRESH PLUS) 0.5 % SOLN 1 drop 3 (three) times daily as needed.    [provider]   cholecalciferol  (VITAMIN D3) 25 MCG (1000 UT) tablet Take 1,000 Units by mouth daily.    [provider]  colestipol  (COLESTID ) 1 g tablet Take 1 tablet (1 g total) by mouth 2 (two) times daily. 01/25/18   Regalado, Belkys A, MD  dorzolamide -timolol  (COSOPT ) 22.3-6.8 MG/ML ophthalmic solution Place 1 drop into both eyes 2 (two) times daily.    [provider]  Insulin  Aspart Prot & Aspart (NOVOLOG  MIX 70/30 [place]) Inject into the skin.    [provider]  LUMIGAN 0.01 % SOLN SMARTSIG:1 Drop(s) In Eye(s) Every Evening 05/22/21   [provider]  pravastatin  (PRAVACHOL ) 40 MG tablet Take 40 mg by mouth daily.    [provider]  RHOPRESSA 0.02 % SOLN Apply to eye. 06/16/21   [provider]  sucralfate (CARAFATE) 1 g tablet Take 1 g by mouth 2 (two) times daily. Patient taking differently: Take 1 g by mouth 2 (two) times daily. Taking as needed 09/17/21   [provider]  traZODone  (DESYREL ) 50 MG tablet Take 0.5 tablets (25 mg total) by mouth at bedtime. 09/30/21   Reed, Tiffany L, DO  VYZULTA 0.024 % SOLN  05/26/19   [provider]     Allergies: Benzocaine, Metformin and related, Morphine and codeine, Olmesartan, and Olmesartan medoxomil-hctz   Review of Systems   ROS as per HPI  Physical Exam Updated Vital Signs BP 124/84 (BP Location: Left Arm)   Pulse 75  Temp 97.8 F (36.6 C) (Oral)   Resp 16   SpO2 98%  Physical Exam Vitals and nursing note reviewed.  Constitutional:      General: She is not in acute distress.    Appearance: Normal appearance. She is well-developed.  HENT:     Head: Normocephalic.     Comments: Hematoma to left scalp, see image Eyes:     Extraocular Movements: Extraocular movements intact.     Conjunctiva/sclera: Conjunctivae normal.  Cardiovascular:     Rate and Rhythm: Normal rate and regular rhythm.     Pulses: Normal pulses.     Heart sounds: No murmur heard. Pulmonary:     Effort:  Pulmonary effort is normal. No respiratory distress.     Breath sounds: Normal breath sounds.  Abdominal:     Palpations: Abdomen is soft.     Tenderness: There is no abdominal tenderness.  Musculoskeletal:        General: No swelling.     Cervical back: Neck supple.     Comments: Chest wall stable, nontender to AP and lateral compression, pelvis stable, nontender to AP and lateral compression, all 4 extremities appear atraumatic, nontender to palpation  Skin:    General: Skin is warm and dry.     Capillary Refill: Capillary refill takes less than 2 seconds.  Neurological:     General: No focal deficit present.     Mental Status: She is alert and oriented to person, place, and time.  Psychiatric:        Mood and Affect: Mood normal.     ED Course/ Medical Decision Making/ A&P    Procedures Procedures   Medications Ordered in ED Medications  lactated ringers  bolus 500 mL (0 mLs Intravenous Stopped 12/06/23 1043)    Medical Decision Making:   Adrienne Gonzalez is a 89 y.o. female who presents for fall as per above.  Physical exam is pertinent for left frontal scalp hematoma.   The differential includes but is not limited to infection, metabolic derangement, ACS, ICH, TBI, skull fracture, spinal fracture/dislocation, blunt thoracic trauma, hemothorax, pneumothorax, rib fractures, blunt abdominal trauma, hemorrhage, extremity fracture, dislocation.  Independent historian: EMS  External data reviewed: Labs: reviewed prior labs for baseline  Initial Plan:   Screening labs including CBC and Metabolic panel to evaluate for infectious or metabolic etiology of disease given patient's ability to provide history is limited by dementia.  Urinalysis with reflex culture ordered to evaluate for UTI or relevant urologic/nephrologic pathology given patient's ability to provide history is limited by dementia.  Screening CT head and CT C-spine given age, fall Weaning chest x-ray, pelvic x-ray  given age, fall EKG to evaluate for cardiac pathology Objective evaluation as below reviewed   Labs: Ordered and Independent interpretation CBC: No leukocytosis, anemia, thrombocytopenia Point-of-care glucose WNL BMP: Mild elevation of creatinine to 1.35 from baseline of 1-1.1.  Symmetric elevation of BUN, does not rise to level of AKI.  No emergent electrolyte derangement.  Radiology: Ordered, Independent interpretation, and All images reviewed independently.  Agree with radiology report at this time.   CT head: No ICH or displaced skull fracture CT C-spine: No displaced fracture or dislocation CXR: No acute cardiac or pulmonary abnormality. No appreciable rib fx. No PTX.  Pelvis XR: Pelvic ring intact. No acute fx. Both hips located.  DG Pelvis 1-2 Views Result Date: 12/06/2023 CLINICAL DATA:  Fall in bathroom this morning.  Pelvic pain. EXAM: DG PELVIS 1 VIEW COMPARISON:  None Available. FINDINGS:  There is no evidence of pelvic fracture or diastasis. Pubic symphysis and lower lumbar spine degenerative changes noted. IMPRESSION: No acute findings. Electronically Signed   By: Norleen DELENA Kil M.D.   On: 12/06/2023 09:01   DG Chest 1 View Result Date: 12/06/2023 CLINICAL DATA:  Fall in bathroom this morning. EXAM: CHEST  1 VIEW COMPARISON:  09/01/2007 FINDINGS: The heart size and mediastinal contours are within normal limits. Both lungs are clear. The visualized skeletal structures are unremarkable. IMPRESSION: No active disease. Electronically Signed   By: Norleen DELENA Kil M.D.   On: 12/06/2023 09:00   CT Cervical Spine Wo Contrast Result Date: 12/06/2023 EXAM: CT CERVICAL SPINE WITHOUT CONTRAST 12/06/2023 08:20:41 AM TECHNIQUE: CT of the cervical spine was performed without the administration of intravenous contrast. Multiplanar reformatted images are provided for review. Automated exposure control, iterative reconstruction, and/or weight based adjustment of the mA/kV was utilized to reduce the  radiation dose to as low as reasonably achievable. COMPARISON: None available. CLINICAL HISTORY: Neck trauma (Age >= 65y) FINDINGS: CERVICAL SPINE: BONES AND ALIGNMENT: No acute fracture or traumatic malalignment. There is some slight degenerative anterolisthesis at C5-C6. DEGENERATIVE CHANGES: At C3-C4, there is moderate chronic degenerative disc disease with a posterior bulging disc osteophyte complex causing moderate-to-severe central spinal canal stenosis and severe bilateral neuroforaminal stenosis. At C5-C6, there is a broad-based bulging disc osteophyte complex, eccentric to the right, causing moderate-to-severe central spinal canal stenosis and severe right neural foraminal stenosis. Moderate right facet hypertrophy contributes to mild-to-moderate right neural foraminal stenosis. There is also moderate left neural foraminal stenosis at C5-C6. At C6-C7, there is disc space narrowing and bilateral uncovertebral joint hypertrophy causing mild central spinal canal stenosis and moderate bilateral neural foraminal stenosis. SOFT TISSUES: No prevertebral soft tissue swelling. IMPRESSION: 1. No acute abnormality of the cervical spine related to the reported neck trauma. 2. Moderate chronic degenerative disc disease at C3-4 with moderate-to-severe central spinal canal stenosis and severe bilateral neuroforaminal stenosis. 3. Broad-based bulging disc osteophyte complex at C5-6 with moderate-to-severe central spinal canal stenosis, severe right neural foraminal stenosis, and moderate left neural foraminal stenosis. 4. Disc space narrowing and bilateral uncovertebral joint hypertrophy at C6-7 causing mild central spinal canal stenosis and moderate bilateral neural foraminal stenosis. Electronically signed by: Evalene Coho MD 12/06/2023 08:28 AM EST RP Workstation: [id number]   CT Head Wo Contrast Result Date: 12/06/2023 EXAM: CT HEAD WITHOUT 12/06/2023 08:20:41 AM TECHNIQUE: CT of the head was performed without  the administration of intravenous contrast. Automated exposure control, iterative reconstruction, and/or weight based adjustment of the mA/kV was utilized to reduce the radiation dose to as low as reasonably achievable. COMPARISON: CT of the head dated 08/29/2007. CLINICAL HISTORY: Head trauma, minor (Age >= 65y). FINDINGS: BRAIN AND VENTRICLES: No acute intracranial hemorrhage. No mass effect or midline shift. No extra-axial fluid collection. No evidence of acute infarct. No hydrocephalus. Age-related atrophy. Moderate periventricular and deep white matter hypodensity typical of chronic small vessel ischemia. Chronic right cerebellar infarct. ORBITS: Bilateral cataract resection. SINUSES AND MASTOIDS: No acute abnormality. SOFT TISSUES AND SKULL: No acute skull fracture. Left frontal scalp hematoma. Atherosclerosis of skullbase vasculature without hyperdense vessel or abnormal calcification. IMPRESSION: 1. No acute intracranial abnormality related to the head trauma. 2. Left frontal scalp hematoma. Electronically signed by: Evalene Coho MD 12/06/2023 08:23 AM EST RP Workstation: [id number]    EKG/Medicine tests: Ordered and Independent interpretation EKG Interpretation: Sinus rhythm Prolonged PR interval Low voltage, precordial leads Abnormal R-wave progression, early transition Nonspecific T  abnormalities, lateral leads Borderline prolonged QT interval , similar to prior Confirmed by Rogelia Satterfield ([id number]) on 12/06/2023 10:01:10 AM  Interventions: LR bolus  See the EMR for full details regarding lab and imaging results.  Currently, patient is awake, alert, and protecting own airway and is hemodynamically stable.  Patient with overall reassuring exam, however given age, limited history, labs and imaging are indicated as per initial plan above.  These were obtained and imaging does not reveal acute traumatic abnormality, labs do reveal slight elevation of creatinine and BUN relative to baseline,  therefore will give LR bolus.  On reevaluation, patient without acute complaints or concerns, daughter at bedside does note that patient has had slightly decreased oral intake, discussed encouraging oral hydration, and follow-up with PCP, daughter felt comfortable this plan, patient discharged in stable condition to daughter's care.  Presentation is most consistent with acute complicated illness, Current presentation is complicated by underlying chronic conditions, and I did consider and rule out acute life/limb-threatening illness  Discussion of management or test interpretations with external provider(s): Not indicated  Risk Drugs:Prescription drug management  Disposition: DISCHARGE: I believe that the patient is safe for discharge home with outpatient follow-up. Patient was informed of all pertinent physical exam, laboratory, and imaging findings. Patient's suspected etiology of their symptom presentation was discussed with the patient and all questions were answered. We discussed following up with PCP. I provided thorough ED return precautions. The patient feels safe and comfortable with this plan.  MDM generated using voice dictation software and may contain dictation errors.  Please contact me for any clarification or with any questions.   Clinical Impression:  1. Ground-level fall   2. Contusion of scalp, initial encounter      Discharge   Final Clinical Impression(s) / ED Diagnoses Final diagnoses:  Ground-level fall  Contusion of scalp, initial encounter    Rx / DC Orders ED Discharge Orders     None        Rogelia Satterfield RAMAN, MD 12/07/23 [phone]

## 2023-12-06 NOTE — Discharge Instructions (Addendum)
 Adrienne Gonzalez  Thank you for allowing us  to take care of you today.  You came to the Emergency Department today because you had a fall at home in the bathroom.  In the emergency department her chest x-ray and her pelvis x-ray did not show any abnormalities, nor did the CT of her head show any bleeding in her head or any broken bones in her neck.  We did get some blood work and her kidney numbers a little bit elevated which means she is probably a little dehydrated.  We gave her some fluids, and we recommend that you encourage her to drink as much she is willing/able to at home.  Otherwise you can use Tylenol and ibuprofen for home.  She does have some bruising on her left forehead, it is common for this bruising to be pulled down by gravity so it would not be necessarily worrisome to see some bruising around her left eye in the next few days.   To-Do: 1. Please follow-up with your primary doctor within 1 - 2 weeks / as soon as possible.   Please return to the Emergency Department or call 911 if you experience have worsening of your symptoms, or do not get better, chest pain, shortness of breath, severe or significantly worsening pain, high fever, severe confusion, pass out or have any reason to think that you need emergency medical care.   We hope you feel better soon.   Mitzie Later, MD Department of Emergency Medicine [facility] [place]

## 2023-12-07 DIAGNOSIS — W1830XA Fall on same level, unspecified, initial encounter: Secondary | ICD-10-CM | POA: Diagnosis not present

## 2023-12-07 DIAGNOSIS — S0003XA Contusion of scalp, initial encounter: Secondary | ICD-10-CM | POA: Diagnosis not present

## 2023-12-10 DIAGNOSIS — H35372 Puckering of macula, left eye: Secondary | ICD-10-CM | POA: Diagnosis not present

## 2023-12-10 DIAGNOSIS — H401133 Primary open-angle glaucoma, bilateral, severe stage: Secondary | ICD-10-CM | POA: Diagnosis not present

## 2023-12-10 DIAGNOSIS — H04123 Dry eye syndrome of bilateral lacrimal glands: Secondary | ICD-10-CM | POA: Diagnosis not present

## 2023-12-10 DIAGNOSIS — E1139 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: Secondary | ICD-10-CM | POA: Diagnosis not present

## 2024-01-26 ENCOUNTER — Ambulatory Visit: Admitting: Podiatry

## 2024-01-26 ENCOUNTER — Encounter: Payer: Self-pay | Admitting: Podiatry

## 2024-01-26 DIAGNOSIS — M79674 Pain in right toe(s): Secondary | ICD-10-CM | POA: Diagnosis not present

## 2024-01-26 DIAGNOSIS — M79675 Pain in left toe(s): Secondary | ICD-10-CM | POA: Diagnosis not present

## 2024-01-26 DIAGNOSIS — E1121 Type 2 diabetes mellitus with diabetic nephropathy: Secondary | ICD-10-CM | POA: Diagnosis not present

## 2024-01-26 DIAGNOSIS — B351 Tinea unguium: Secondary | ICD-10-CM | POA: Diagnosis not present

## 2024-02-01 NOTE — Progress Notes (Signed)
"  °  Subjective:  Patient ID: Adrienne Gonzalez, female    DOB: [birth year],  MRN: [id number]  Adrienne Gonzalez presents to clinic today for at risk foot care. Pt has h/o NIDDM with chronic kidney disease and painful elongated mycotic toenails 1-5 bilaterally which are tender when wearing enclosed shoe gear. Pain is relieved with periodic professional debridement. She is accompanied by her daughter on today's visit. Chief Complaint  Patient presents with   Adrienne Gonzalez    IDDM Patient with an of A1C 6.4 presents today for a [facility] and nail trim  PCP: Adrienne Gonzalez LAST MEDICAL VISIT 09/2023   New problem(s): None.   PCP is Adrienne Gonzalez.  Allergies[1]  Review of Systems: Negative except as noted in the HPI.  Objective: No changes noted in today's physical examination. There were no vitals filed for this visit. Adrienne Gonzalez is a pleasant 89 y.o. female WD, WN in NAD. AAO x 3.  Vascular Examination: CFT <3 seconds b/l. DP/PT pulses faintly palpable b/l. Pedal edema absent. Skin temperature gradient warm to warm b/l. Digital hair absent. No pain with calf compression. No ischemia or gangrene. No cyanosis or clubbing noted b/l.    Neurological Examination: Sensation grossly intact b/l with 10 gram monofilament. Vibratory sensation intact b/l.   Dermatological Examination: Pedal skin warm and supple b/l.   No open wounds. No interdigital macerations.  Toenails 1-5 b/l thick, discolored, elongated with subungual debris and pain on dorsal palpation.    No corns, calluses, nor porokeratotic lesions.  Musculoskeletal Examination: Muscle strength 5/5 to all lower extremity muscle groups bilaterally. HAV with bunion deformity noted b/l LE. Hammertoe deformity noted 2-5 b/l.  Radiographs: None  Assessment/Plan: 1. Pain due to onychomycosis of toenails of both feet   2. Diabetic nephropathy associated with type 2 diabetes mellitus (HCC)   Patient was evaluated and treated. All patient's and/or POA's  questions/concerns addressed on today's visit. Mycotic toenails 1-5 b/l debrided in length and girth without incident.  Continue daily foot inspections and monitor blood glucose per PCP/Endocrinologist's recommendations.Continue soft, supportive shoe gear daily. Report any pedal injuries to medical professional. Call office if there are any quesitons/concerns. -Patient/POA to call should there be question/concern in the interim.   Return in about 3 months (around 04/25/2024).  Adrienne Gonzalez, DPM      [place] LOCATION: [address], [place] [address]                   Office [phone]   [place] LOCATION: [address] [place], [place] [address] Office [phone]     [1]  Allergies Allergen Reactions   Benzocaine Other (See Comments)    confusion   Metformin And Related Diarrhea   Morphine And Codeine Itching   Olmesartan     Other reaction(s): diarrhea   Olmesartan Medoxomil-Hctz Diarrhea   "

## 2024-04-27 ENCOUNTER — Ambulatory Visit: Admitting: Podiatry
